# Patient Record
Sex: Male | Born: 1935 | Race: White | Hispanic: No | Marital: Married | Smoking: Former smoker
Health system: Southern US, Community
[De-identification: ages and names within clinical notes are randomized; demographics above are authoritative.]

## PROBLEM LIST (undated history)

## (undated) DIAGNOSIS — E119 Type 2 diabetes mellitus without complications: Secondary | ICD-10-CM

## (undated) DIAGNOSIS — I639 Cerebral infarction, unspecified: Secondary | ICD-10-CM

## (undated) DIAGNOSIS — H5789 Other specified disorders of eye and adnexa: Secondary | ICD-10-CM

## (undated) DIAGNOSIS — J189 Pneumonia, unspecified organism: Secondary | ICD-10-CM

## (undated) DIAGNOSIS — T4145XA Adverse effect of unspecified anesthetic, initial encounter: Secondary | ICD-10-CM

## (undated) DIAGNOSIS — I4892 Unspecified atrial flutter: Secondary | ICD-10-CM

## (undated) DIAGNOSIS — I739 Peripheral vascular disease, unspecified: Secondary | ICD-10-CM

## (undated) DIAGNOSIS — E785 Hyperlipidemia, unspecified: Secondary | ICD-10-CM

## (undated) DIAGNOSIS — M0820 Juvenile rheumatoid arthritis with systemic onset, unspecified site: Secondary | ICD-10-CM

## (undated) DIAGNOSIS — G4730 Sleep apnea, unspecified: Secondary | ICD-10-CM

## (undated) DIAGNOSIS — Z86718 Personal history of other venous thrombosis and embolism: Secondary | ICD-10-CM

## (undated) DIAGNOSIS — T8859XA Other complications of anesthesia, initial encounter: Secondary | ICD-10-CM

## (undated) DIAGNOSIS — I2510 Atherosclerotic heart disease of native coronary artery without angina pectoris: Secondary | ICD-10-CM

## (undated) DIAGNOSIS — I7410 Embolism and thrombosis of unspecified parts of aorta: Secondary | ICD-10-CM

## (undated) DIAGNOSIS — F329 Major depressive disorder, single episode, unspecified: Secondary | ICD-10-CM

## (undated) DIAGNOSIS — I350 Nonrheumatic aortic (valve) stenosis: Secondary | ICD-10-CM

## (undated) DIAGNOSIS — Z87442 Personal history of urinary calculi: Secondary | ICD-10-CM

## (undated) DIAGNOSIS — I10 Essential (primary) hypertension: Secondary | ICD-10-CM

## (undated) DIAGNOSIS — Z951 Presence of aortocoronary bypass graft: Secondary | ICD-10-CM

## (undated) DIAGNOSIS — F32A Depression, unspecified: Secondary | ICD-10-CM

## (undated) DIAGNOSIS — R578 Other shock: Secondary | ICD-10-CM

## (undated) DIAGNOSIS — Z8673 Personal history of transient ischemic attack (TIA), and cerebral infarction without residual deficits: Secondary | ICD-10-CM

## (undated) DIAGNOSIS — M1990 Unspecified osteoarthritis, unspecified site: Secondary | ICD-10-CM

## (undated) DIAGNOSIS — K219 Gastro-esophageal reflux disease without esophagitis: Secondary | ICD-10-CM

## (undated) DIAGNOSIS — Z8739 Personal history of other diseases of the musculoskeletal system and connective tissue: Secondary | ICD-10-CM

## (undated) HISTORY — PX: BUNIONECTOMY: SHX129

## (undated) HISTORY — PX: HAMMER TOE SURGERY: SHX385

## (undated) HISTORY — PX: COLON RESECTION: SHX5231

## (undated) HISTORY — PX: LITHOTRIPSY: SUR834

## (undated) HISTORY — PX: JOINT REPLACEMENT: SHX530

## (undated) HISTORY — PX: KNEE ARTHROSCOPY: SHX127

## (undated) HISTORY — PX: TONSILLECTOMY: SUR1361

---

## 1963-10-04 HISTORY — PX: OTHER SURGICAL HISTORY: SHX169

## 1995-10-04 DIAGNOSIS — M0820 Juvenile rheumatoid arthritis with systemic onset, unspecified site: Secondary | ICD-10-CM

## 1995-10-04 HISTORY — DX: Juvenile rheumatoid arthritis with systemic onset, unspecified site: M08.20

## 1998-10-03 DIAGNOSIS — I2510 Atherosclerotic heart disease of native coronary artery without angina pectoris: Secondary | ICD-10-CM

## 1998-10-03 DIAGNOSIS — E119 Type 2 diabetes mellitus without complications: Secondary | ICD-10-CM

## 1998-10-03 HISTORY — DX: Atherosclerotic heart disease of native coronary artery without angina pectoris: I25.10

## 1998-10-03 HISTORY — DX: Type 2 diabetes mellitus without complications: E11.9

## 1998-10-03 HISTORY — PX: CORONARY ARTERY BYPASS GRAFT: SHX141

## 1998-10-03 HISTORY — PX: HIATAL HERNIA REPAIR: SHX195

## 1999-02-10 ENCOUNTER — Encounter: Payer: Self-pay | Admitting: Cardiovascular Disease

## 1999-02-10 ENCOUNTER — Inpatient Hospital Stay (HOSPITAL_COMMUNITY): Admission: AD | Admit: 1999-02-10 | Discharge: 1999-02-16 | Payer: Self-pay | Admitting: Cardiovascular Disease

## 1999-02-11 ENCOUNTER — Encounter: Payer: Self-pay | Admitting: Cardiothoracic Surgery

## 1999-02-12 ENCOUNTER — Encounter: Payer: Self-pay | Admitting: Cardiothoracic Surgery

## 1999-02-13 ENCOUNTER — Encounter: Payer: Self-pay | Admitting: Cardiothoracic Surgery

## 1999-08-06 ENCOUNTER — Inpatient Hospital Stay (HOSPITAL_COMMUNITY): Admission: RE | Admit: 1999-08-06 | Discharge: 1999-08-11 | Payer: Self-pay

## 1999-08-19 DIAGNOSIS — I2510 Atherosclerotic heart disease of native coronary artery without angina pectoris: Secondary | ICD-10-CM | POA: Diagnosis present

## 2001-09-17 ENCOUNTER — Encounter (INDEPENDENT_AMBULATORY_CARE_PROVIDER_SITE_OTHER): Payer: Self-pay | Admitting: Specialist

## 2001-09-17 ENCOUNTER — Ambulatory Visit (HOSPITAL_COMMUNITY): Admission: RE | Admit: 2001-09-17 | Discharge: 2001-09-17 | Payer: Self-pay | Admitting: Gastroenterology

## 2002-07-01 ENCOUNTER — Encounter: Payer: Self-pay | Admitting: Orthopedic Surgery

## 2002-07-03 ENCOUNTER — Inpatient Hospital Stay (HOSPITAL_COMMUNITY): Admission: RE | Admit: 2002-07-03 | Discharge: 2002-07-07 | Payer: Self-pay | Admitting: Orthopedic Surgery

## 2007-10-04 DIAGNOSIS — I350 Nonrheumatic aortic (valve) stenosis: Secondary | ICD-10-CM

## 2007-10-04 HISTORY — PX: AORTIC VALVE REPLACEMENT: SHX41

## 2007-10-04 HISTORY — DX: Nonrheumatic aortic (valve) stenosis: I35.0

## 2008-04-10 DIAGNOSIS — Z953 Presence of xenogenic heart valve: Secondary | ICD-10-CM

## 2008-04-28 ENCOUNTER — Ambulatory Visit: Payer: Self-pay | Admitting: Cardiology

## 2008-06-18 ENCOUNTER — Ambulatory Visit: Payer: Self-pay | Admitting: Cardiology

## 2008-07-16 ENCOUNTER — Ambulatory Visit: Payer: Self-pay | Admitting: Cardiothoracic Surgery

## 2008-09-08 ENCOUNTER — Encounter: Payer: Self-pay | Admitting: Cardiothoracic Surgery

## 2008-09-08 ENCOUNTER — Ambulatory Visit (HOSPITAL_COMMUNITY): Admission: RE | Admit: 2008-09-08 | Discharge: 2008-09-08 | Payer: Self-pay | Admitting: Cardiothoracic Surgery

## 2008-09-09 ENCOUNTER — Ambulatory Visit: Payer: Self-pay | Admitting: Cardiothoracic Surgery

## 2008-09-10 ENCOUNTER — Encounter: Payer: Self-pay | Admitting: Cardiothoracic Surgery

## 2008-09-10 ENCOUNTER — Inpatient Hospital Stay (HOSPITAL_COMMUNITY): Admission: RE | Admit: 2008-09-10 | Discharge: 2008-09-16 | Payer: Self-pay | Admitting: Cardiothoracic Surgery

## 2008-09-10 ENCOUNTER — Ambulatory Visit: Payer: Self-pay | Admitting: Cardiothoracic Surgery

## 2008-10-10 ENCOUNTER — Ambulatory Visit: Payer: Self-pay | Admitting: Cardiothoracic Surgery

## 2008-10-10 ENCOUNTER — Encounter: Admission: RE | Admit: 2008-10-10 | Discharge: 2008-10-10 | Payer: Self-pay | Admitting: Cardiothoracic Surgery

## 2009-02-25 ENCOUNTER — Encounter: Admission: RE | Admit: 2009-02-25 | Discharge: 2009-02-25 | Payer: Self-pay | Admitting: Orthopedic Surgery

## 2009-10-03 HISTORY — PX: BACK SURGERY: SHX140

## 2010-06-08 ENCOUNTER — Ambulatory Visit (HOSPITAL_COMMUNITY): Admission: RE | Admit: 2010-06-08 | Discharge: 2010-06-08 | Payer: Self-pay | Admitting: Neurosurgery

## 2010-09-10 ENCOUNTER — Inpatient Hospital Stay (HOSPITAL_COMMUNITY)
Admission: RE | Admit: 2010-09-10 | Discharge: 2010-09-14 | Payer: Self-pay | Source: Home / Self Care | Attending: Neurosurgery | Admitting: Neurosurgery

## 2010-10-24 ENCOUNTER — Encounter: Payer: Self-pay | Admitting: Nephrology

## 2010-12-13 LAB — URINALYSIS, ROUTINE W REFLEX MICROSCOPIC
Ketones, ur: NEGATIVE mg/dL
Nitrite: NEGATIVE
Protein, ur: NEGATIVE mg/dL

## 2010-12-13 LAB — CBC
MCH: 33.1 pg (ref 26.0–34.0)
MCHC: 34.8 g/dL (ref 30.0–36.0)
MCV: 95.1 fL (ref 78.0–100.0)
Platelets: 240 10*3/uL (ref 150–400)
RBC: 4.74 MIL/uL (ref 4.22–5.81)

## 2010-12-13 LAB — COMPREHENSIVE METABOLIC PANEL
Albumin: 4.2 g/dL (ref 3.5–5.2)
BUN: 8 mg/dL (ref 6–23)
Calcium: 9.5 mg/dL (ref 8.4–10.5)
Chloride: 100 mEq/L (ref 96–112)
Creatinine, Ser: 0.91 mg/dL (ref 0.4–1.5)
GFR calc non Af Amer: >60 mL/min (ref >60)
Total Bilirubin: 0.8 mg/dL (ref 0.3–1.2)

## 2010-12-13 LAB — PROTIME-INR: Prothrombin Time: 12.7 seconds (ref 11.6–15.2)

## 2010-12-13 LAB — GLUCOSE, CAPILLARY
Glucose-Capillary: 119 mg/dL — ABNORMAL HIGH (ref 70–99)
Glucose-Capillary: 127 mg/dL — ABNORMAL HIGH (ref 70–99)

## 2010-12-13 LAB — DIFFERENTIAL
Basophils Absolute: 0 10*3/uL (ref 0.0–0.1)
Lymphocytes Relative: 25 % (ref 12–46)
Lymphs Abs: 2 10*3/uL (ref 0.7–4.0)
Monocytes Absolute: 0.8 10*3/uL (ref 0.1–1.0)
Neutro Abs: 4.7 10*3/uL (ref 1.7–7.7)

## 2010-12-13 LAB — APTT: aPTT: 24 seconds (ref 24–37)

## 2010-12-16 LAB — URINALYSIS, ROUTINE W REFLEX MICROSCOPIC
Bilirubin Urine: NEGATIVE
Hgb urine dipstick: NEGATIVE
Ketones, ur: 15 mg/dL — AB
Protein, ur: NEGATIVE mg/dL
Urobilinogen, UA: 0.2 mg/dL (ref 0.0–1.0)

## 2010-12-16 LAB — CBC
Platelets: 244 10*3/uL (ref 150–400)
RBC: 3.86 MIL/uL — ABNORMAL LOW (ref 4.22–5.81)
WBC: 8.1 10*3/uL (ref 4.0–10.5)

## 2010-12-16 LAB — COMPREHENSIVE METABOLIC PANEL
AST: 27 U/L (ref 0–37)
Albumin: 4.1 g/dL (ref 3.5–5.2)
Chloride: 104 mEq/L (ref 96–112)
Creatinine, Ser: 3.28 mg/dL — ABNORMAL HIGH (ref 0.4–1.5)
GFR calc Af Amer: 23 mL/min — ABNORMAL LOW (ref >60)
Potassium: 4.7 mEq/L (ref 3.5–5.1)
Sodium: 138 mEq/L (ref 135–145)
Total Bilirubin: 0.7 mg/dL (ref 0.3–1.2)

## 2010-12-16 LAB — APTT: aPTT: 24 seconds (ref 24–37)

## 2010-12-16 LAB — DIFFERENTIAL
Basophils Absolute: 0 10*3/uL (ref 0.0–0.1)
Eosinophils Relative: 4 % (ref 0–5)
Lymphocytes Relative: 29 % (ref 12–46)
Monocytes Absolute: 0.8 10*3/uL (ref 0.1–1.0)

## 2010-12-16 LAB — TYPE AND SCREEN
ABO/RH(D): A POS
Antibody Screen: NEGATIVE

## 2010-12-16 LAB — URINE MICROSCOPIC-ADD ON

## 2010-12-16 LAB — SURGICAL PCR SCREEN: Staphylococcus aureus: NEGATIVE

## 2011-02-15 NOTE — Assessment & Plan Note (Signed)
OFFICE VISIT   FELIBERTO, STOCKLEY  DOB:  20-Oct-1934                                        October 10, 2008  CHART #:  [id number]   CURRENT PROBLEMS:  1. Status post redo sternotomy for aortic valve replacement with a 23-      mm pericardial tissue valve for severe aortic sclerosis with      congestive heart failure.  2. Status post multivessel coronary artery bypass surgery in 2000 with      patent grafts.  3. Diabetes mellitus.  4. Obesity.  5. Sleep apnea.  6. Rheumatoid arthritis.   PRESENT ILLNESS:  The patient is a pleasant 76 year old diabetic  nonsmoker who recently underwent redo sternotomy aortic valve  replacement for a moderate-to-severe aortic stenosis with a documented  episode of class IV CHF.  He did well after surgery remaining in sinus  rhythm and his ventricular function is fairly well preserved.  He  continues to progress at home without recurrent symptoms of CHF or  angina and the sternal chest incision is healing well.  He is now on  room air oxygen and he is scheduled to start outpatient cardiac  rehabilitation later this month.  He has been on multiple medications  and Dr. Alanda Amass recently increased his beta-blocker to Lopressor 25 mg  b.i.d. and reduced the Altace 10 mg once a day.  He has been receiving  home physical therapy and has progressed and advanced past that stage  now.  He was not placed on Coumadin because he is on chronic Plavix  therapy and aspirin.   His other medications include Aricept, TriCor, metformin, Nexium, Zocor,  Lexapro, diclofenac for his arthritis, and vitamins.   PHYSICAL EXAMINATION:  VITAL SIGNS:  Blood pressure 150/80, pulse 80 and  regular, respirations 18, and saturation 97%.  GENERAL:  He is alert and oriented.  LUNGS:  Breath sounds are clear and equal.  The sternum is stable and  well healed.  CARDIAC:  Pericardial rhythm is regular with a few PACs.  There is no  murmur or gallop.  EXTREMITIES:  He has trace ankle edema.  NEUROLOGIC:  Intact.   A PA and lateral chest x-ray today shows some mild bilateral pleural  effusions with stable cardiac silhouette and the sternal wires were all  intact and well aligned.   IMPRESSION AND PLAN:  I told the patient he can resume driving light  activities, but not to lift more than 20 pounds until March.  It is fine  for him to start with his rehabilitation up at the [facility].  I  gave him a short course of oral Lasix of 40 mg a day for 1 week and  otherwise, we would be happy to see him back for any future surgical  issues.   [name], M.D.  Electronically Signed   PV/MEDQ  D:  10/10/2008  T:  10/10/2008  Job:  [id number]   cc:   Gerlene Burdock A. Alanda Amass, M.D.  Doreen Beam, MD

## 2011-02-15 NOTE — H&P (Signed)
HISTORY AND PHYSICAL EXAMINATION   September 09, 2008   Re:  [name] [name], [name] [name]       DOB:  11/19/1934   ADMISSION DIAGNOSES:  1. Moderate-to-severe aortic stenosis with class IV congestive heart      failure.  2. Status post multivessel bypass grafting in 2000 with patent grafts.  3. Diabetes mellitus.  4. Obesity.  5. Sleep apnea on BiPAP.  6. Juvenile rheumatoid arthritis.   CHIEF COMPLAINT:  Shortness of breath on exertion and a cardiac murmur.   PRESENT ILLNESS:  The patient is a 76 year old obese diabetic nonsmoker  who has been found by serial 2D echos to have significant aortic  stenosis with a calculated area of 0.7.  His EF is 50-60% and he has  patent bypass grafts to the LAD, ramus OM, and posterior descending from  bypass surgery 9-1/2 years ago.  A right heart cath showed mildly  elevated right-sided pressures of cardiac output of 5 liters and  calcified valve leaflets by fluoroscopy.  He was felt by Dr. Alanda Amass,  his cardiologist to the candidate for aortic valve replacement, to  preserve LV function, and to prevent further episodes of CHF, which  required hospitalization.  He has had his preop studies including his  carotid Dopplers, which showed no significant disease.  His chest x-ray  shows no overt CHF with prior sternotomy.  His dentist has examined his  teeth and he has been cleared for elective cardiac valve surgery.  He  has stopped his Plavix and fish oil, which he has been taking long term  for a TIA in the past.  He has had no respiratory symptoms of influenza  or other problems and is now ready to be admitted for elective aortic  valve replacement with a bioprosthetic valve.   HOME MEDICATIONS:  Lopressor 50 mg q.a.m. and 25 mg q.p.m.; Plavix;  Tricor; Altace 10 mg b.i.d.; Nexium; aspirin 81 mg; Niaspan 1 g;  vitamins E, C and D; Zocor 20 mg; Lexapro 10 mg; Aricept 10 mg;  metformin 1 g b.i.d.; Mobic p.r.n. pain for arthritis;  Lasix 40 mg; and  potassium 20 mEq daily.   ALLERGIES:  None.   SURGICAL HISTORY:  1. Status post CABG x4 in May 2000.  2. 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  3. Status post hemicolectomy in 1989 for resection of a benign tumor.   SOCIAL HISTORY:  The patient is married and has a sedentary lifestyle.  He does not smoke or use alcohol.  He is very active in the area of [employer]  [employer].   REVIEW OF SYSTEMS:  Vital Signs:  His weight has been stable, 290  pounds.  He has had an influenza vaccine.  ENT review is negative.  Thoracic review is negative for recent trauma and his sternal incisions  have healed well.  He has had an umbilical hernia repair approximately 3  years ago without incident.  He has had no symptoms of dysuria or  hematuria, blood per rectum, nausea, vomiting, and is diabetic.  Sugars  have been under fairly good control.   PHYSICAL EXAMINATION:  VITAL SIGNS:  The patient is 5 feet 11 inches,  weighs 290 pounds, blood pressure is 160/80, pulse 70, respirations 20,  and saturation room air is 97%.  GENERAL APPEARANCE:  A middle-aged obese white male, in no distress.  HEENT:  Normocephalic.  Dentition good.  Pupils equal.  NECK:  Without JVD or mass and there is a soft right carotid bruit.  LYMPHATICS:  No palpable cervical or supraclavicular palpable  adenopathy.  CHEST:  Clear breath sounds.  He has a well-healed sternal incision.  CARDIAC:  Regular rhythm and he has a grade 3-4/6 systolic ejection  murmur heard best at the right upper sternal border.  There is no  diastolic murmur.  ABDOMEN:  Soft, nontender with well-healed old surgical scars.  EXTREMITIES:  Mild pedal edema.  Peripheral pulses are intact.  NEUROLOGIC:  Intact and he has a fairly normal gait, but with a slightly  shuffled walking pattern.   LABORATORY DATA:  His coronary arteriograms revealed patent grafts with  occluded native vessels.  He has  a good systolic LV function.  He has  moderate-to-severe aortic stenosis with calcified leaflets in the valve  area of 0.7 by echo and a transvalvular gradient of 24 mmHg.   PLAN:  We had planned the patient will be headed for aortic valve  replacement with a bioprosthetic valve on September 10, 2008.  I discussed  the indications, benefits, and risks of surgery with the patient, which  he understands and agrees to proceed.   [name], M.D.  Electronically Signed   PV/MEDQ  D:  09/09/2008  T:  09/09/2008  Job:  [id number]

## 2011-02-15 NOTE — Op Note (Signed)
NAME:  [name] [name], [name] [name] NO.:  [id number]   MEDICAL RECORD NO.:  [id number]          PATIENT TYPE:  INP   LOCATION:  2305                         FACILITY:  [facility]   PHYSICIAN:  [name], M.D.  DATE OF BIRTH:  [birth year]   DATE OF PROCEDURE:  09/10/2008  DATE OF DISCHARGE:                               OPERATIVE REPORT   OPERATIONS:  1. Redo median sternotomy.  2. Aortic valve replacement with a 23-mm pericardial valve [facility], model 3300TFX, serial [phone]).   PREOPERATIVE DIAGNOSIS:  Moderate-to-severe aortic stenosis with class  IV congestive heart failure.   POSTOPERATIVE DIAGNOSIS:  Moderate-to-severe aortic stenosis with class  IV congestive heart failure.   SURGEON:  [name], MD   ASSISTANT:  [name], PA-C   ANESTHESIA:  General by Dr. [name].   INDICATIONS:  The patient is a 76 year old obese male status post CABG  x4 in early year 2000.  He was recently hospitalized with acute  congestive heart failure and found to have patent grafts with moderate-  to-severe aortic stenosis.  The aortic valve area by 2-D echo was 0.7.  The aortic gradient and the area calculated by cath were slightly less  severe.  Because of his markedly abnormal aortic valve and episode of  congestive failure, it was felt that he would benefit from aortic valve  replacement with a tissue valve.  I examined the patient in the office  and reviewed results of his echo study and cath.  I discussed with him  the indications and expected benefits of aortic valve replacement for  treatment of his moderate-to-severe aortic stenosis.  I reviewed the  alternatives as well.  I discussed with the patient the major aspects of  the planned procedure including the location of the surgical incision,  the use of general anesthesia in cardiopulmonary bypass, the plan to use  a tissue valve due to his age over 70 years, and the expected  postoperative hospital recovery.  I also discussed with [name]  the risks to him of bleeding, infection, stroke, heart attack, blood  transfusion requirement, and death.  After reviewing these issues, he  demonstrated his understanding and agreed to proceed with the surgery  under what I felt was an informed consent.   OPERATIVE FINDINGS:  1. Moderate-to-severe aortic stenosis with severe calcification and      fibrosis of the valve, effectively treated with a 23-mm pericardial      valve replacement.  2. Patent bypass grafts which were identified and protected during the      operation.  3. No blood cell transfusions were required for the operation.   PROCEDURE IN DETAIL:  The patient was brought to operating room and  placed supine on the operating table where general anesthesia was  induced under invasive hemodynamic monitoring.  The chest, abdomen, and  legs were prepped with Betadine and draped as a sterile field.  A  sternal incision was made and the sternum was divided using the  oscillating saw with care being taken to avoid injury to the underlying  vascular structures.  The anterior mediastinum was dissected of its  investing dense adhesions.  The ascending aorta and right atrium were  identified.  A left-sided Rultract retractor was then placed to perform  the dissection on the left side of heart, at which time the mammary  artery pedicle was identified coming through the pericardium and was  encircled with a vessel loop.  The main sternal retractor was then  placed using the deep blades due to his obese body habitus.  Further  dissection was then completed and the patient was heparinized.  Purse-  strings were placed in the ascending aorta, in the arch, and in the  right atrium.  After the ACT was documented as being therapeutic, the  patient was cannulated and placed on bypass.  A left ventricular vent  was placed via the right superior pulmonary vein.   Cardioplegia  catheters were placed for cold blood cardioplegia in the ascending aorta  and into the coronary sinus.  The patient was cooled to 32 degrees and  using a large cross-clamp, the ascending aorta was clamped just below  the aortic cannula and above the previously identified vein grafts.  The  vein graft to the right coronary was mobilized and gently retracted  laterally, so as to allow room for the aortotomy and the aortic valve  replacement.  Cold blood cardioplegia 1 L was delivered to the aortic  root as well as to the coronary sinus catheters and there was good  cardioplegic arrest with septal temperature dropping less than 15  degrees.  Cardioplegia was then delivered every 20 minutes or less while  the cross-clamp was applied.   The mammary artery pedicle was clamped with an atraumatic vascular  bulldog while the cross-clamp was in place.  The other vein grafts were  identified and mobilized and also occluded with atraumatic vascular  bulldogs.   A transverse aortotomy was made just above the sinotubular junction.  Aortic valve was inspected and was calcified stenotic and fibrotic.  It  was excised and the annulus was debrided of calcium.  The outflow tract  was irrigated with cold saline.  The annulus sized to 23-mm Magna-Ease  valve.  Seventeen subannular 2-0 pledgeted Ethibond sutures were then  placed around the annulus.  The valve was washed and prepared according  to the protocol.  The sutures were placed to the sewing ring, the valve  was seated, and the sutures were tied.  The valve had good orientation  without obstruction of the coronary ostia and there were no spaces in  between the sutures which would cause the perivalvular leak.  The valve  was then again rinsed with cold saline and the aortotomy was closed in 2  layers using running 4-0 Prolene.  Prior to removing the cross-clamp,  air was vented from the coronaries and left side of heart with a dose of   retrograde warm blood cardioplegia in the usual de-airing maneuvers on  bypass.  The cross-clamp was then removed.   The heart was cardioverted back to a regular rhythm.  The aortotomy was  inspected and found to be hemostatic.  Air was aspirated from the vein  grafts and each was opened.  The mammary artery was unclamped to  reestablish flow.  The patient was rewarmed to 37 degrees.  Temporary  pacing wires were applied.  The lungs re-expanded.  The ventilator was  resumed.  The patient was weaned from bypass without difficulty on low-  dose dopamine and  milrinone.  Cardiac output and blood pressure were  stable.  Protamine was administered without adverse reaction.  The  patient still had diffuse coagulopathy and bleeding.  He was given  platelets and FFP with improved coagulation function.  The mediastinum  was irrigated with warm antibiotic irrigation.  The superior pericardial  fat was closed over the aorta.  Two mediastinal chest tubes were placed  and brought out through separate incisions.  The sternum was closed with  interrupted steel wire.  The pectoralis fascia was closed in a running  #1 Vicryl.  The subcutaneous and skin layers were closed in running  Vicryl and sterile dressings were applied.  Total bypass time was 153  minutes.      [name], M.D.  Electronically Signed    PV/MEDQ  D:  09/10/2008  T:  09/11/2008  Job:  [id number]   cc:   Gerlene Burdock A. Alanda Amass, M.D.  [facility]

## 2011-02-15 NOTE — Discharge Summary (Signed)
NAME:  [name] [name], [name] [name] NO.:  [id number]   MEDICAL RECORD NO.:  [id number]          PATIENT TYPE:  INP   LOCATION:  2018                         FACILITY:  [facility]   PHYSICIAN:  [name], M.D.  DATE OF BIRTH:  05/25/1935   DATE OF ADMISSION:  09/10/2008  DATE OF DISCHARGE:  09/16/2008                               DISCHARGE SUMMARY   HISTORY:  The patient is a 76 year old, obese, diabetic, nonsmoker, who  has been found by serial 2-D echocardiograms to have significant aortic  stenosis with a calculated valve area of 0.7.  His ejection fraction was  noted to be 50-60%, and he had patent coronary bypass grafts to the LAD,  ramus, and posterior descending from surgical revascularization  approximately 9 years ago.  He recently underwent a right heart  catheterization showing mildly elevated right-sided pressures with  cardiac output of 5 L and significantly calcified valve leaflets by  fluoroscopy.  He was felt by Dr. Alanda Amass to be a candidate for aortic  valve replacement.  He was referred to [name], MD, who  evaluated the patient and studies and agreed with recommendations, and  he was admitted this hospitalization for redo sternotomy and/or aortic  valve replacement.  Preop studies included carotid Dopplers which showed  no significant disease.  A chest x-ray showed no overt congestive heart  failure with prior sternotomy.  He received dental clearance.  He was on  both, Plavix and fish oil which were stopped prior to admission and were  being used for previous TIA.  He was admitted this hospitalization for  elective aortic valve replacement with bioprosthetic valve.   Past medical history includes the following:  1. Moderate-to-severe aortic stenosis with history of class IV      congestive heart failure.  2. Coronary artery disease status post previous multivessel bypass      grafting in 2000 with patent grafts.  3. Diabetes mellitus.  4.  Obesity.  5. Sleep apnea on chronic BiPAP.  6. History of juvenile rheumatoid arthritis.   Medications prior to admission included the following:  1. Aricept 10 mg daily.  2. Lopressor 25 mg twice daily.  3. Plavix 75 mg daily, last taken on August 29, 2008.  4. TriCor 160 mg daily.  5. Metformin 500 mg daily.  6. Nexium 40 mg b.i.d.  7. Zocor 20 mg daily.  8. Aspirin 81 mg daily.  9. Niaspan 1000 mg at bedtime.  10.Temazepam 30 mg at bedtime.  11.Altace 10 mg twice daily.  12.Lexapro 10 mg daily.  13.Diclofenac 75 mg daily.  14.Lasix 40 mg daily.  15.Potassium chloride 10 mEq daily.  16.Multivitamin 1 daily.  17.B complex vitamin 1 daily.  18.Vitamin E 1000 units daily.  19.Vitamin C 500 mg daily.  20.Omega-3 1000 mg daily.  21.Tylenol Arthritis formula 2 tablets every 8 hours.   ALLERGIES:  None.   PAST SURGICAL HISTORY:  1. Coronary artery bypass grafting in May 2000.  2. Status post thoracotomy in the 1960s for a mycotic lung infection      on the left.  3. Status  post hemicolectomy in 1989 for resection of a benign tumor.   FAMILY HISTORY, SOCIAL HISTORY, REVIEW OF SYMPTOMS, AND PHYSICAL  EXAMINATION:  Please see the history and physical done at the time of  admission.   HOSPITAL COURSE:  The patient was admitted electively on September 10, 2008.  He was taken to the operating room where he underwent the  following procedures:  1. Redo median sternotomy.  2. Aortic valve replacement with a 23-mm pericardial valve [facility], model 3300TFX, serial number [id number]).  This procedure      was performed by [name], MD.  He tolerated the procedure      well and was taken to the surgical intensive care unit in stable      condition.   POSTOPERATIVE HOSPITAL COURSE:  Overall, the patient has made very  steady progress.  He has remained hemodynamically stable.  Initially, he  did require some inotropic support including milrinone and dopamine,  but  these were weaned without difficulty.  He was weaned from the ventilator  without difficulty.  He has remained neurologically intact.  Initially,  he was quite weak but has progressed nicely in regards to his cardiac  rehabilitation.  He has had moderate volume overload requiring diuresis  which will continue as an outpatient.  He has had no significant cardiac  dysrhythmias.  His diabetes is under adequate control with aggressive  management.  His metformin has been restarted.  His laboratory values  revealed acute blood loss anemia, but his values stabilized.  Most  recent hematocrit dated September 15, 2008, is 37.  Creatinine for same  date is 1.1.  He has required aggressive pulmonary toilet but has  responded well and oxygen has been weaned, and he currently maintains  good saturations on room air.  His incision is healing well without  evidence of infection.  He has been placed back on Plavix and aspirin  and is not felt to require Coumadin.  His overall status is felt to be  tentatively stable for discharge in the morning of September 16, 2008,  pending morning round reevaluation.   Medications at discharge were as follows:  1. Aricept 10 mg daily.  2. Plavix 75 mg daily.  3. TriCor 160 mg daily.  4. Metformin 1000 mg twice daily.  5. Nexium 40 mg b.i.d.  6. Zocor 20 mg nightly.  7. Temazepam 30 mg at bedtime.  8. Lexapro 10 mg daily.  9. Lasix 40 mg for next 7 days.  10.Potassium chloride 10 mEq daily for 7 days.  11.Multivitamin 1 daily.  12.B complex vitamin 1 daily.  13.Vitamin E 1000 units daily.  14.Vitamin C 500 mg daily.  15.Omega-3 supplements 1000 mg daily.  16.Tylenol Arthritis formula p.r.n.  17.Aspirin 325 mg daily.  18.Toprol-XL 25 mg daily.  19.Tylox 1-2 every 4 hours as needed for pain.   INSTRUCTIONS:  The patient received written instructions regarding  medications, activity, diet, wound care, and followup.  Followup include  Dr. Donata Clay in 3  weeks, Dr. Alanda Amass in 2 weeks.   CONDITION ON DISCHARGE:  Stable and improving.   FINAL DIAGNOSIS:  Moderate to severe aortic stenosis with class IV  congestive heart failure, now status post aortic valve replacement and  redo median sternotomy.   OTHER DIAGNOSES:  1. Coronary artery disease status post multivessel coronary artery      bypass grafting x4 in 2000.  2. History of diabetes mellitus.  3. Obesity.  4. Chronic sleep apnea, on BiPAP.  5. Juvenile rheumatoid arthritis.  6. Postoperative acute blood loss anemia.      Rowe Clack, P.A.-C.      [name], M.D.  Electronically Signed    WEG/MEDQ  D:  09/15/2008  T:  09/15/2008  Job:  [id number]   cc:   Gerlene Burdock A. Alanda Amass, M.D.

## 2011-02-18 NOTE — Op Note (Signed)
NAME:  [name] [name], [name] [name]                       ACCOUNT NO.:  [id number]   MEDICAL RECORD NO.:  [id number]                   PATIENT TYPE:  INP   LOCATION:  5028                                 FACILITY:  [facility]   PHYSICIAN:  Elana Alm. Thurston Hole, M.D.              DATE OF BIRTH:  [birth year]   DATE OF PROCEDURE:  07/03/2002  DATE OF DISCHARGE:                                 OPERATIVE REPORT   PREOPERATIVE DIAGNOSIS:  Left knee degenerative joint disease.   POSTOPERATIVE DIAGNOSIS:  Left knee degenerative joint disease.   PROCEDURE:  Left total knee replacement using [employer]ics Scorpio total knee  system with #9 cemented femoral component, #11 cemented tibial component  with 12 mm polyethylene tibial flex spacer, a 30 mm polyethylene cemented  patella.   SURGEON:  Elana Alm. Thurston Hole, M.D.   ASSISTANT:  Julien Girt, P.A.   ANESTHESIA:  General.   OPERATIVE TIME:  1-1/2 hours.   COMPLICATIONS:  None.   DESCRIPTION OF PROCEDURE:  The patient is brought to the operating room on  07/03/02, placed on the operating room table in supine position.  After an  adequate level of general anesthesia was obtained, his left knee was  examined under anesthesia.  Range of motion from -10 to 120 degrees with  moderate varus deformity, stable ligamentous examination with normal  patellar tracking.  He had a Foley catheter placed under sterile conditions,  and received Ancef 1 g IV preoperatively for prophylaxis.  His left leg was  then prepped using sterile Duraprep and draped using sterile technique.  Leg  was exsanguinated and a thigh tourniquet elevated 375 mm.  Initially,  through a 20 cm longitudinal incision based over the patella, initial  exposure was made.  Then all the subcutaneous tissues were incised along  with the skin incision.  A median arthrotomy was performed revealing  excessive amount of normal appearing joint fluid.  The articular surfaces  were inspected.  He was found to  have grade 4 changes medially, grade 3  changes laterally, grade 3 and 4 changes in the patellofemoral joint.  The  medial and lateral meniscal remnants were removed, as well as the anterior  cruciate ligament.  Osteophytes were removed off the femoral condyles and  tibial plateau.  An intramedullary drill was then drilled up the femoral  canal for placement of the distal femoral cutting jig which was placed in  the appropriate amount of rotation, and a distal 12 mm cut was made.  The  distal femur was then sized.  A #9 was found to be the appropriate size, and  the #9 cutting jig was placed, and then these cuts were made.  The proximal  tibia was then exposed.  The tibial spines were removed with an oscillating  saw.  Intramedullary drill drilled down the tibial canal for placement of  the proximal tibial cutting jig which was placed in the appropriate amount  of rotation, and a 4 mm proximal tibial cut was made.  After this was done,  the Scorpio PCL cutter was placed back on the distal femur, and these cuts  were made.  After this was done, the #9 femoral trial was placed, the #11  tibial base plate trial was placed, and a 12 mm polyethylene spacer was  placed giving excellent stability, correction of his varus deformity, and  range of motion from 0 to 130 degrees.  The tibial base plate was then  marked for rotation, and the Keel cut was made.  After this was done, the  patella was sized.  A 30 mm was found to be the appropriate size and a  recessed 10 mm x 30 mm cut was made and three locking holes were placed.  After this was done, it was felt that all the trial components were of  excellent size, fit, and stability.  They were then removed.  The knee was  then jet lavaged irrigated with 3 L of saline solution.  The proximal tibia  was then exposed, and the #11 tibial base plate with cement backing was then  hammered into position with an excellent fit, with excess cement being   removed from around the edges.  The #9 femoral component with cement backing  was hammered into position, also with an excellent fit with excess cement  being removed from around the edges.  The 12 mm polyethylene flexed tibial  spacer was locked on the tibial base plate, the knee taken through a range  of motion 0 to 125 degrees with excellent stability and no lift-off on the  tray.  The 30 mm polyethylene patella was then locked in this recessed hole  with cement backing and held there with a clamp.  After the cement hardened,  patellofemoral tracking was evaluated, and this was found to be normal with  no need for a lateral release.  After this was done, it was felt that all  the trial components were of excellent size, fit, and stability.  The knee  was further irrigation with antibiotic solution.  The median arthrotomy was  then closed with #1 Ethibond suture over two medium Hemovac drains.  The  subcutaneous tissues were closed with 0 and 2-0 Vicryl.  Skin was closed  with skin staples.  Sterile dressings were applied.  The Hemovac was  injected with 0.25% Marcaine with epinephrine and clamped.  Tourniquet was  released.  The patient then had a femoral nerve block placed by anesthesia  for postoperative pain control.  He was then awakened and taken to the  recovery room in stable condition.  Needle and sponge counts were correct x2  at the end of the case.                                                Robert A. Thurston Hole, M.D.    RAW/MEDQ  D:  07/03/2002  T:  07/06/2002  Job:  [id number]

## 2011-02-18 NOTE — Consult Note (Signed)
NEW PATIENT CONSULTATION   [name] [name], [name] [name]  DOB:  10-May-1935                                        July 16, 2008  CHART #:  [id number]   PRIMARY CARE PHYSICIAN:  Doreen Beam, MD in [place].   REASON FOR CONSULTATION:  Moderate-to-severe aortic stenosis with  episode of CHF requiring hospitalization.   CHIEF COMPLAINT:  Shortness of breath and a heart murmur.   HISTORY OF PRESENT ILLNESS:  I was asked to evaluate this 76 year old  obese white male diabetic for potential redo sternotomy and aortic valve  replacement for recently diagnosed moderate-to-severe aortic stenosis.  The patient had urgent CABG x4 in May 2000 for critical three-vessel  coronary disease and unstable angina.  At that time, the left IMA was  grafted, the LAD and vein grafts were placed to the ramus intermediate,  OM, and posterior descending.  He did well from a cardiac standpoint  until last month when he developed symptoms of CHF shortly after waking  up on Monday morning.  He had severe shortness of breath, orthopnea, and  bilateral ankle swelling.  He was admitted to [facility]  where his BNP was 540, and his chest x-ray showed interstitial pulmonary  edema, and he responded well to Lasix diuresis.  His cardiac enzymes  were negative.  He subsequently underwent an office 2-D echo by Dr.  Alanda Amass which indicated his aortic valve gradient was increased with a  mean of 34 mmHg and a calculated valve area of 0.72.  A stress  Cardiolite scan was performed that showed an EF of 50-60% without clear  ischemia.  He underwent diagnostic left and right heart cath last week  on October 8.  This demonstrated patent grafts to the LAD, ramus, OM,  and PDA.  His EF was 55%.  The transvalvular aortic gradient mean was 22  mmHg.  PA pressures were 40/10 and the cardiac output was 5.7 liters per  minute.  The estimated aortic valve area by cath data was 1.3.  There  was calcification of  the aortic leaflets seen on fluoroscopy.  The  catheter did pass the aortic valve to obtain a ventriculogram, which  showed no significant mitral regurgitation.  An aortogram was performed,  which showed mild AI.  Because of the patient's aortic valve disease and  his episode of CHF, he was felt to be a potential candidate for redo  sternotomy and/or valve replacement with a bioprosthetic valve.  Since  this episode and hospitalization for his CHF, the patient has been  asymptomatic and is taking Lasix.   MEDICATIONS:  Lopressor 50 mg q.a.m., 25 mg nightly; Plavix 75 mg daily;  TriCor 160 mg daily; Altace 10 mg b.i.d.; Nexium 40 mg daily; aspirin 81  mg daily; Niaspan 1 g daily; multivitamins 1 daily; vitamin E 1000 units  daily; vitamin C 500 mg daily; Zocor 20 mg nightly; fish oil 1 g daily;  Lexapro 10 mg daily; Aricept 10 mg daily; metformin 500 mg daily;  temazepam 30 mg nightly; and Mobic 75 mg daily as needed for arthritis;  Lasix 40 mg daily; and potassium 10 mEq daily.   ALLERGIES:  None.   PAST MEDICAL HISTORY:  1. 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  2. Hypertension.  3. Coronary artery disease  status post CABG x4 in May 2000 with patent      graft.  4. Diabetes mellitus.  5. Moderate-to-severe aortic stenosis.  6. Hyperlipidemia.  7. Sleep apnea on CPAP nightly, 11 cm of water pressure.  8. 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  9. Exogenous obesity.   SURGICAL HISTORY:  1. CABG x4 in May 2000.  2. 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  3. Status post hemicolectomy in 1989 for resection of benign tumor.   SOCIAL HISTORY:  The patient is retired from [employer].  He is married and  has a sedentary lifestyle due to his arthritis and his obesity, which  limits his mobility.  He has not fallen.  He quit smoking at age 64.  He  denies alcohol  intake.   FAMILY HISTORY:  Significant for coronary artery disease.  Negative for  cardiac valve disease.   REVIEW OF SYSTEMS:  General review is negative for fever or weight loss.  The patient has had a flu influenza vaccine while he was hospitalized in  September.  ENT review is significant that he sees a dentist twice a  year and has no active dental problems or difficulty swallowing.  Thoracic view is negative for history of trauma, but he has had a prior  thoracotomy for drainage of a mycotic lung infection.  He has also had  sternotomy for his CABG.  He has some broken sternal wires on his x-ray.  Cardiac review is positive for history of his cardiac murmur and known  moderate-to-severe aortic stenosis by echo and cath.  He has preserved  LV function with patent grafts.  GI review is negative for hepatitis,  jaundice, blood per rectum or pain.  Vascular review is positive for  _TIA (remote) and_________ negative for claudication.  He does have  bilateral venous insufficiency and some stasis skin changes and chronic  edema, right greater than left leg.  Negative history of DVT.  Endocrine  is positive for diabetes.  Negative for thyroid disease.  Hematologic  review is negative for bleeding disorder or history of bleeding with his  prior surgical operations.  Neurologic review is negative for a stroke  or seizure.   PHYSICAL EXAMINATION:  VITAL SIGNS:  The patient is 5 foot 11, weight is  290 pounds, blood pressure 160/70, pulse 60, respirations 20, and  saturation on room air 95%.  GENERAL APPEARANCE:  An elderly obese white male in no acute distress.  HEENT:  Normocephalic.  Dentition good.  NECK:  No JVD noted.  Carotids are with a soft right bruit negative left  carotid bruit.  LYMPHATICS:  Revealed no palpable cervical or supraclavicular  adenopathy.  LUNGS:  Breath sounds are clear without wheeze.  The thorax is without  deformity.  The sternal incision is well-healed.   CARDIAC:  Regular.  There is loud 3-4/6 ejection murmur at the right  upper sternal border.  There is no diastolic rumble.  ABDOMEN:  Obese, soft, nontender.  He has a well-healed surgical scar  from the colectomy.  EXTREMITIES:  Revealed mild pedal edema.  Peripheral pulses are intact  in the extremities, and he has 2+ edema on the right, 1+ edema on the  left and a well-healed surgical scar over his right lower leg where the  saphenous vein was harvested surgically.  NEUROLOGIC:  Intact.  He is  able  to walk with slightly shuffled gait.   LABORATORY DATA:  I reviewed his cardiac cath and the 2-D echo reports  from Dr. Kandis Cocking office.  He has moderate-to-severe aortic stenosis  with an episode of symptomatic CHF requiring hospitalization and  diuresis.  At that time, there is no evidence of atrial fibrillation,  coexisting infectious process, anemia, or significant stress.   RECOMMENDATIONS:  His aortic valve is definitely stenotic and the  severity of the stenosis varies between the assessment mode (cath versus  2-D echo).  Clearly had one episode of CHF without any other etiologic  issues other than his aortic valve, and I would agree that he would be a  candidate for aortic valve replacement with a bioprosthetic valve, but  in increased risk due to his comorbid factors.  He wishes to participate  in [place] [employer] ceremony in mid November, and so his surgery will be  scheduled for early December.  He will obtain a note from his dentist  clearing him for elective heart valve replacement, and I will see him  back in office on  December 4th for the surgery the following week.  He will stop the  Plavix and fish oil approximately 10 days prior to surgery.  He will  continue the aspirin and his other medications.   [name], M.D.  Electronically Signed   PV/MEDQ  D:  07/16/2008  T:  07/17/2008  Job:  [id number]   cc:   Doreen Beam, MD

## 2011-02-18 NOTE — Op Note (Signed)
[place]  Patient:    [name] [name], [name] [name] Visit Number: [id number] MRN: [id number]          Service Type: END Location: ENDO Attending Physician:  Rich Brave Dictated by:   Florencia Reasons, M.D. Proc. Date: 09/17/01 Admit Date:  09/17/2001   CC:         Doreen Beam, M.D.   Operative Report  PROCEDURE PERFORMED:  Colonoscopy with polypectomy.  ENDOSCOPIST:  Florencia Reasons, M.D.  INDICATIONS FOR PROCEDURE:  The patient is a 76 year old with a prior history of significant right-sided colonic polyp necessitating a right hemicolectomy approximately 12 years ago, now six years status post his most recent surveillance colonoscopy.  FINDINGS:  Several diminutive polyps removed.  DESCRIPTION OF PROCEDURE:  The nature, purpose and risks of the procedure were familiar to the patient from prior examination and he provided written consent.  Sedation was fentanyl 75 mcg and Versed 10 mg IV without arrhythmias or desaturation.  Digital exam of the prostate was unremarkable.  The Olympus adult video colonoscope was advanced to the ileocolonic anastomosis and pullback was then performed.  The quality of the prep was excellent and it is felt that all areas were well seen.  There were a couple of tiny polyps in the transverse colon removed by one or two cold biopsies each.  Also, at about 18 cm from the external anal opening, there was a 4 mm semipedunculated polyp removed by combination of snare technique and a cold biopsy.  No large polyps, cancer, colitis, vascular malformations or diverticular disease were observed.  Retroflexion was not performed in the rectum.  The patient tolerated the procedure well and there were no complications.  IMPRESSION: 1. Colon polyps removed as described above. 2. Otherwise normal, status post right hemicolectomy.  PLAN:  Await pathology on polyps.Dictated by:   Florencia Reasons, M.D.  Attending  Physician:  Rich Brave DD:  09/17/01 TD:  09/17/01 Job: [id number]

## 2011-02-18 NOTE — Discharge Summary (Signed)
NAME:  [name] [name], [name] [name] NO.:  [id number]   MEDICAL RECORD NO.:  [id number]                    PATIENT TYPE:   LOCATION:                                       FACILITY:   PHYSICIAN:  Elana Alm. Thurston Hole, M.D.              DATE OF BIRTH:  06-21-1935   DATE OF ADMISSION:  07/03/2002  DATE OF DISCHARGE:  07/07/2002                                 DISCHARGE SUMMARY   ADMISSION DIAGNOSES:  1. End-stage degenerative joint disease, left knee.  2. Hypertension.  3. Still's disease.  4. Coronary artery disease.  5. Gout.   DISCHARGE DIAGNOSES:  1. End-stage degenerative joint disease, left knee.  2. Hypertension.  3. Still's disease.  4. Coronary artery disease.  5. Gout.  6. Aortic valve dysfunction.  7. Obesity.   HISTORY OF PRESENT ILLNESS:  The patient is a 76 year old white male with a  history of Still's disease, history of hypertension, history of coronary  artery disease, history of gout, who has end-stage DJD of his left knee. He  has tried anti-inflammatories, cortisone injections, and ________  injections, all without success. At this point in time, he has pain at  night, pain with rest, pain with activity. He understands the risks,  benefits, and possible complications of a left total replacement and is  without question.   HOSPITAL COURSE:  On 07/03/02, the patient underwent a left total knee  replacement by Dr. Thurston Hole. He tolerated the procedure well. Dr. Alanda Amass,  his cardiologist, was consulted to manage his coronary artery disease  postoperatively. On postoperative day #1, surgical wound was well  approximated. His pain was under control with a PCA. Hemoglobin was 12.3.  Surgical wound was well approximated. His dressing was changed. His Foley  was discontinued. His PCA was discontinued. He was started on OxyContin 10  mg twice a day, OxyIR one to two q.4h. p.r.n. pain. On postoperative day #2,  the patient was unable to be moved off  of telemetry floor. On postoperative  day #1, he ambulated six steps, T-max of 99. His hemoglobin was 11.3. His  INR was 1.2. Orders were written for him to be transferred to 5000.  Postoperative day #3, surgical wound was well approximated, the patient was  well controlled on p.o. pain medicine. Surgical wound was well approximated.  His calf was soft. His hemoglobin was 11.1. He was afebrile. Postoperative  day #4, surgical wound well approximated. His INR was 1.5. His surgical  wound was well approximated. His pain is controlled. He was stable from a  cardiac standpoint. He was discharged to home in stable condition.   DISCHARGE MEDICATIONS:  1. OxyIR 5 mg one to two every four to six hours as needed for pain.  2. Coumadin 5 mg one and a half tablets daily.  3. Lopressor 25 mg b.i.d.  4. Tricor 160 mg q.d.  5. Nexium 40 mg q.d.  6. Niaspan 250 mg q.h.s.  7. Restoril 30 mg q.h.s.  8. Altace 5 mg b.i.d.   DISCHARGE INSTRUCTIONS:  He is weight bearing as tolerated on a regular  diet. He will follow up with Dr. Thurston Hole in 10 days from discharge for x-rays  and staple removal. If he has problems with increased pain, increased  drainage before then, he will call.     Kirstin Shepperson, P.A.                  Robert A. Thurston Hole, M.D.    KS/MEDQ  D:  08/19/2002  T:  08/19/2002  Job:  [id number]

## 2011-03-17 ENCOUNTER — Ambulatory Visit (HOSPITAL_COMMUNITY)
Admission: RE | Admit: 2011-03-17 | Discharge: 2011-03-17 | Disposition: A | Discharge status: Home / Self Care | Payer: Medicare Other | Source: Ambulatory Visit | Attending: Urology | Admitting: Urology

## 2011-03-17 ENCOUNTER — Other Ambulatory Visit (HOSPITAL_COMMUNITY): Payer: Self-pay | Admitting: Urology

## 2011-03-17 DIAGNOSIS — N201 Calculus of ureter: Secondary | ICD-10-CM

## 2011-03-17 DIAGNOSIS — R109 Unspecified abdominal pain: Secondary | ICD-10-CM | POA: Insufficient documentation

## 2011-03-17 DIAGNOSIS — N200 Calculus of kidney: Secondary | ICD-10-CM | POA: Insufficient documentation

## 2011-07-07 LAB — POCT I-STAT 4, (NA,K, GLUC, HGB,HCT)
Glucose, Bld: 127 mg/dL — ABNORMAL HIGH (ref 70–99)
Glucose, Bld: 132 mg/dL — ABNORMAL HIGH (ref 70–99)
Glucose, Bld: 135 mg/dL — ABNORMAL HIGH (ref 70–99)
Glucose, Bld: 136 mg/dL — ABNORMAL HIGH (ref 70–99)
Glucose, Bld: 136 mg/dL — ABNORMAL HIGH (ref 70–99)
Glucose, Bld: 139 mg/dL — ABNORMAL HIGH (ref 70–99)
Glucose, Bld: 147 mg/dL — ABNORMAL HIGH (ref 70–99)
Glucose, Bld: 153 mg/dL — ABNORMAL HIGH (ref 70–99)
Glucose, Bld: 226 mg/dL — ABNORMAL HIGH (ref 70–99)
HCT: 28 % — ABNORMAL LOW (ref 39.0–52.0)
HCT: 29 % — ABNORMAL LOW (ref 39.0–52.0)
HCT: 30 % — ABNORMAL LOW (ref 39.0–52.0)
HCT: 30 % — ABNORMAL LOW (ref 39.0–52.0)
HCT: 30 % — ABNORMAL LOW (ref 39.0–52.0)
HCT: 31 % — ABNORMAL LOW (ref 39.0–52.0)
HCT: 33 % — ABNORMAL LOW (ref 39.0–52.0)
HCT: 39 % (ref 39.0–52.0)
HCT: 42 % (ref 39.0–52.0)
Hemoglobin: 10.2 g/dL — ABNORMAL LOW (ref 13.0–17.0)
Hemoglobin: 10.2 g/dL — ABNORMAL LOW (ref 13.0–17.0)
Hemoglobin: 10.2 g/dL — ABNORMAL LOW (ref 13.0–17.0)
Hemoglobin: 10.5 g/dL — ABNORMAL LOW (ref 13.0–17.0)
Hemoglobin: 11.2 g/dL — ABNORMAL LOW (ref 13.0–17.0)
Hemoglobin: 13.3 g/dL (ref 13.0–17.0)
Hemoglobin: 14.3 g/dL (ref 13.0–17.0)
Hemoglobin: 9.5 g/dL — ABNORMAL LOW (ref 13.0–17.0)
Hemoglobin: 9.9 g/dL — ABNORMAL LOW (ref 13.0–17.0)
Potassium: 3.5 mEq/L (ref 3.5–5.1)
Potassium: 3.9 mEq/L (ref 3.5–5.1)
Potassium: 4 mEq/L (ref 3.5–5.1)
Potassium: 4.1 mEq/L (ref 3.5–5.1)
Potassium: 4.3 mEq/L (ref 3.5–5.1)
Potassium: 4.5 mEq/L (ref 3.5–5.1)
Potassium: 4.6 mEq/L (ref 3.5–5.1)
Potassium: 4.7 mEq/L (ref 3.5–5.1)
Potassium: 4.9 mEq/L (ref 3.5–5.1)
Sodium: 132 mEq/L — ABNORMAL LOW (ref 135–145)
Sodium: 132 mEq/L — ABNORMAL LOW (ref 135–145)
Sodium: 133 mEq/L — ABNORMAL LOW (ref 135–145)
Sodium: 133 mEq/L — ABNORMAL LOW (ref 135–145)
Sodium: 133 mEq/L — ABNORMAL LOW (ref 135–145)
Sodium: 133 mEq/L — ABNORMAL LOW (ref 135–145)
Sodium: 135 mEq/L (ref 135–145)
Sodium: 136 mEq/L (ref 135–145)
Sodium: 138 mEq/L (ref 135–145)

## 2011-07-07 LAB — URINALYSIS, ROUTINE W REFLEX MICROSCOPIC
Bilirubin Urine: NEGATIVE
Glucose, UA: NEGATIVE mg/dL
Hgb urine dipstick: NEGATIVE
Ketones, ur: 15 mg/dL — AB
Nitrite: NEGATIVE
Protein, ur: NEGATIVE mg/dL
Specific Gravity, Urine: 1.035 — ABNORMAL HIGH (ref 1.005–1.030)
Urobilinogen, UA: 0.2 mg/dL (ref 0.0–1.0)
pH: 5.5 (ref 5.0–8.0)

## 2011-07-07 LAB — POCT I-STAT 3, ART BLOOD GAS (G3+)
Acid-Base Excess: 2 mmol/L (ref 0.0–2.0)
Acid-Base Excess: 3 mmol/L — ABNORMAL HIGH (ref 0.0–2.0)
Acid-Base Excess: 3 mmol/L — ABNORMAL HIGH (ref 0.0–2.0)
Acid-Base Excess: 3 mmol/L — ABNORMAL HIGH (ref 0.0–2.0)
Acid-base deficit: 1 mmol/L (ref 0.0–2.0)
Acid-base deficit: 1 mmol/L (ref 0.0–2.0)
Bicarbonate: 23.2 mEq/L (ref 20.0–24.0)
Bicarbonate: 24.2 mEq/L — ABNORMAL HIGH (ref 20.0–24.0)
Bicarbonate: 25 mEq/L — ABNORMAL HIGH (ref 20.0–24.0)
Bicarbonate: 25.2 mEq/L — ABNORMAL HIGH (ref 20.0–24.0)
Bicarbonate: 27 mEq/L — ABNORMAL HIGH (ref 20.0–24.0)
Bicarbonate: 27.9 mEq/L — ABNORMAL HIGH (ref 20.0–24.0)
Bicarbonate: 28.6 mEq/L — ABNORMAL HIGH (ref 20.0–24.0)
Bicarbonate: 28.9 mEq/L — ABNORMAL HIGH (ref 20.0–24.0)
O2 Saturation: 100 %
O2 Saturation: 100 %
O2 Saturation: 100 %
O2 Saturation: 100 %
O2 Saturation: 100 %
O2 Saturation: 89 %
O2 Saturation: 94 %
O2 Saturation: 95 %
Patient temperature: 36.8
Patient temperature: 37.3
Patient temperature: 37.4
TCO2: 24 mmol/L (ref 0–100)
TCO2: 25 mmol/L (ref 0–100)
TCO2: 26 mmol/L (ref 0–100)
TCO2: 26 mmol/L (ref 0–100)
TCO2: 28 mmol/L (ref 0–100)
TCO2: 29 mmol/L (ref 0–100)
TCO2: 30 mmol/L (ref 0–100)
TCO2: 30 mmol/L (ref 0–100)
pCO2 arterial: 37.6 mmHg (ref 35.0–45.0)
pCO2 arterial: 38.4 mmHg (ref 35.0–45.0)
pCO2 arterial: 38.8 mmHg (ref 35.0–45.0)
pCO2 arterial: 40.8 mmHg (ref 35.0–45.0)
pCO2 arterial: 46.5 mmHg — ABNORMAL HIGH (ref 35.0–45.0)
pCO2 arterial: 47.2 mmHg — ABNORMAL HIGH (ref 35.0–45.0)
pCO2 arterial: 47.9 mmHg — ABNORMAL HIGH (ref 35.0–45.0)
pCO2 arterial: 50.8 mmHg — ABNORMAL HIGH (ref 35.0–45.0)
pH, Arterial: 7.338 — ABNORMAL LOW (ref 7.350–7.450)
pH, Arterial: 7.363 (ref 7.350–7.450)
pH, Arterial: 7.373 (ref 7.350–7.450)
pH, Arterial: 7.391 (ref 7.350–7.450)
pH, Arterial: 7.391 (ref 7.350–7.450)
pH, Arterial: 7.399 (ref 7.350–7.450)
pH, Arterial: 7.404 (ref 7.350–7.450)
pH, Arterial: 7.464 — ABNORMAL HIGH (ref 7.350–7.450)
pO2, Arterial: 194 mmHg — ABNORMAL HIGH (ref 80.0–100.0)
pO2, Arterial: 200 mmHg — ABNORMAL HIGH (ref 80.0–100.0)
pO2, Arterial: 214 mmHg — ABNORMAL HIGH (ref 80.0–100.0)
pO2, Arterial: 229 mmHg — ABNORMAL HIGH (ref 80.0–100.0)
pO2, Arterial: 260 mmHg — ABNORMAL HIGH (ref 80.0–100.0)
pO2, Arterial: 60 mmHg — ABNORMAL LOW (ref 80.0–100.0)
pO2, Arterial: 72 mmHg — ABNORMAL LOW (ref 80.0–100.0)
pO2, Arterial: 76 mmHg — ABNORMAL LOW (ref 80.0–100.0)

## 2011-07-07 LAB — BLOOD GAS, ARTERIAL
Acid-Base Excess: 0.5 mmol/L (ref 0.0–2.0)
Bicarbonate: 24.7 mEq/L — ABNORMAL HIGH (ref 20.0–24.0)
Drawn by: 206361
FIO2: 0.21 %
O2 Saturation: 96.6 %
Patient temperature: 98.6
TCO2: 25.9 mmol/L (ref 0–100)
pCO2 arterial: 40.3 mmHg (ref 35.0–45.0)
pH, Arterial: 7.404 (ref 7.350–7.450)
pO2, Arterial: 81.3 mmHg (ref 80.0–100.0)

## 2011-07-07 LAB — MAGNESIUM
Magnesium: 2 mg/dL (ref 1.5–2.5)
Magnesium: 2.1 mg/dL (ref 1.5–2.5)
Magnesium: 2.2 mg/dL (ref 1.5–2.5)
Magnesium: 2.3 mg/dL (ref 1.5–2.5)

## 2011-07-07 LAB — BASIC METABOLIC PANEL
BUN: 13 mg/dL (ref 6–23)
BUN: 16 mg/dL (ref 6–23)
BUN: 17 mg/dL (ref 6–23)
BUN: 19 mg/dL (ref 6–23)
CO2: 25 mEq/L (ref 19–32)
CO2: 28 mEq/L (ref 19–32)
CO2: 28 mEq/L (ref 19–32)
CO2: 29 mEq/L (ref 19–32)
Calcium: 8.3 mg/dL — ABNORMAL LOW (ref 8.4–10.5)
Calcium: 8.5 mg/dL (ref 8.4–10.5)
Calcium: 8.7 mg/dL (ref 8.4–10.5)
Calcium: 9.3 mg/dL (ref 8.4–10.5)
Chloride: 105 mEq/L (ref 96–112)
Chloride: 109 mEq/L (ref 96–112)
Chloride: 96 mEq/L (ref 96–112)
Chloride: 97 mEq/L (ref 96–112)
Creatinine, Ser: 1.04 mg/dL (ref 0.4–1.5)
Creatinine, Ser: 1.07 mg/dL (ref 0.4–1.5)
Creatinine, Ser: 1.29 mg/dL (ref 0.4–1.5)
Creatinine, Ser: 1.48 mg/dL (ref 0.4–1.5)
GFR calc Af Amer: 57 mL/min — ABNORMAL LOW (ref >60)
GFR calc Af Amer: >60 mL/min (ref >60)
GFR calc Af Amer: >60 mL/min (ref >60)
GFR calc Af Amer: >60 mL/min (ref >60)
GFR calc non Af Amer: 47 mL/min — ABNORMAL LOW (ref >60)
GFR calc non Af Amer: 55 mL/min — ABNORMAL LOW (ref >60)
GFR calc non Af Amer: >60 mL/min (ref >60)
GFR calc non Af Amer: >60 mL/min (ref >60)
Glucose, Bld: 119 mg/dL — ABNORMAL HIGH (ref 70–99)
Glucose, Bld: 120 mg/dL — ABNORMAL HIGH (ref 70–99)
Glucose, Bld: 138 mg/dL — ABNORMAL HIGH (ref 70–99)
Glucose, Bld: 148 mg/dL — ABNORMAL HIGH (ref 70–99)
Potassium: 3.8 mEq/L (ref 3.5–5.1)
Potassium: 3.8 mEq/L (ref 3.5–5.1)
Potassium: 4.2 mEq/L (ref 3.5–5.1)
Potassium: 4.4 mEq/L (ref 3.5–5.1)
Sodium: 131 mEq/L — ABNORMAL LOW (ref 135–145)
Sodium: 135 mEq/L (ref 135–145)
Sodium: 140 mEq/L (ref 135–145)
Sodium: 142 mEq/L (ref 135–145)

## 2011-07-07 LAB — CREATININE, SERUM
Creatinine, Ser: 1.26 mg/dL (ref 0.4–1.5)
Creatinine, Ser: 1.89 mg/dL — ABNORMAL HIGH (ref 0.4–1.5)
GFR calc Af Amer: 43 mL/min — ABNORMAL LOW (ref >60)
GFR calc Af Amer: >60 mL/min (ref >60)
GFR calc non Af Amer: 35 mL/min — ABNORMAL LOW (ref >60)
GFR calc non Af Amer: 56 mL/min — ABNORMAL LOW (ref >60)

## 2011-07-07 LAB — TYPE AND SCREEN
ABO/RH(D): A POS
Antibody Screen: NEGATIVE

## 2011-07-07 LAB — CBC
HCT: 27.2 % — ABNORMAL LOW (ref 39.0–52.0)
HCT: 29.1 % — ABNORMAL LOW (ref 39.0–52.0)
HCT: 29.1 % — ABNORMAL LOW (ref 39.0–52.0)
HCT: 30.1 % — ABNORMAL LOW (ref 39.0–52.0)
HCT: 30.5 % — ABNORMAL LOW (ref 39.0–52.0)
HCT: 31 % — ABNORMAL LOW (ref 39.0–52.0)
HCT: 34.2 % — ABNORMAL LOW (ref 39.0–52.0)
HCT: 42 % (ref 39.0–52.0)
Hemoglobin: 10 g/dL — ABNORMAL LOW (ref 13.0–17.0)
Hemoglobin: 10.1 g/dL — ABNORMAL LOW (ref 13.0–17.0)
Hemoglobin: 10.3 g/dL — ABNORMAL LOW (ref 13.0–17.0)
Hemoglobin: 10.4 g/dL — ABNORMAL LOW (ref 13.0–17.0)
Hemoglobin: 10.6 g/dL — ABNORMAL LOW (ref 13.0–17.0)
Hemoglobin: 11.6 g/dL — ABNORMAL LOW (ref 13.0–17.0)
Hemoglobin: 14.3 g/dL (ref 13.0–17.0)
Hemoglobin: 9.7 g/dL — ABNORMAL LOW (ref 13.0–17.0)
MCHC: 33.9 g/dL (ref 30.0–36.0)
MCHC: 33.9 g/dL (ref 30.0–36.0)
MCHC: 34 g/dL (ref 30.0–36.0)
MCHC: 34.1 g/dL (ref 30.0–36.0)
MCHC: 34.3 g/dL (ref 30.0–36.0)
MCHC: 34.3 g/dL (ref 30.0–36.0)
MCHC: 34.6 g/dL (ref 30.0–36.0)
MCHC: 35.7 g/dL (ref 30.0–36.0)
MCV: 97.3 fL (ref 78.0–100.0)
MCV: 98.6 fL (ref 78.0–100.0)
MCV: 98.6 fL (ref 78.0–100.0)
MCV: 98.7 fL (ref 78.0–100.0)
MCV: 98.9 fL (ref 78.0–100.0)
MCV: 98.9 fL (ref 78.0–100.0)
MCV: 99.1 fL (ref 78.0–100.0)
MCV: 99.2 fL (ref 78.0–100.0)
Platelets: 116 10*3/uL — ABNORMAL LOW (ref 150–400)
Platelets: 118 10*3/uL — ABNORMAL LOW (ref 150–400)
Platelets: 122 10*3/uL — ABNORMAL LOW (ref 150–400)
Platelets: 130 10*3/uL — ABNORMAL LOW (ref 150–400)
Platelets: 133 10*3/uL — ABNORMAL LOW (ref 150–400)
Platelets: 141 10*3/uL — ABNORMAL LOW (ref 150–400)
Platelets: 204 10*3/uL (ref 150–400)
Platelets: 245 10*3/uL (ref 150–400)
RBC: 2.8 MIL/uL — ABNORMAL LOW (ref 4.22–5.81)
RBC: 2.93 MIL/uL — ABNORMAL LOW (ref 4.22–5.81)
RBC: 2.96 MIL/uL — ABNORMAL LOW (ref 4.22–5.81)
RBC: 3.04 MIL/uL — ABNORMAL LOW (ref 4.22–5.81)
RBC: 3.08 MIL/uL — ABNORMAL LOW (ref 4.22–5.81)
RBC: 3.15 MIL/uL — ABNORMAL LOW (ref 4.22–5.81)
RBC: 3.47 MIL/uL — ABNORMAL LOW (ref 4.22–5.81)
RBC: 4.25 MIL/uL (ref 4.22–5.81)
RDW: 12.9 % (ref 11.5–15.5)
RDW: 13.1 % (ref 11.5–15.5)
RDW: 13.1 % (ref 11.5–15.5)
RDW: 13.2 % (ref 11.5–15.5)
RDW: 13.3 % (ref 11.5–15.5)
RDW: 13.4 % (ref 11.5–15.5)
RDW: 13.4 % (ref 11.5–15.5)
RDW: 13.6 % (ref 11.5–15.5)
WBC: 10.2 10*3/uL (ref 4.0–10.5)
WBC: 6 10*3/uL (ref 4.0–10.5)
WBC: 6.5 10*3/uL (ref 4.0–10.5)
WBC: 7.6 10*3/uL (ref 4.0–10.5)
WBC: 8.6 10*3/uL (ref 4.0–10.5)
WBC: 9.3 10*3/uL (ref 4.0–10.5)
WBC: 9.8 10*3/uL (ref 4.0–10.5)
WBC: 9.8 10*3/uL (ref 4.0–10.5)

## 2011-07-07 LAB — PREPARE FRESH FROZEN PLASMA

## 2011-07-07 LAB — GLUCOSE, CAPILLARY
Glucose-Capillary: 101 mg/dL — ABNORMAL HIGH (ref 70–99)
Glucose-Capillary: 103 mg/dL — ABNORMAL HIGH (ref 70–99)
Glucose-Capillary: 104 mg/dL — ABNORMAL HIGH (ref 70–99)
Glucose-Capillary: 106 mg/dL — ABNORMAL HIGH (ref 70–99)
Glucose-Capillary: 110 mg/dL — ABNORMAL HIGH (ref 70–99)
Glucose-Capillary: 111 mg/dL — ABNORMAL HIGH (ref 70–99)
Glucose-Capillary: 113 mg/dL — ABNORMAL HIGH (ref 70–99)
Glucose-Capillary: 117 mg/dL — ABNORMAL HIGH (ref 70–99)
Glucose-Capillary: 118 mg/dL — ABNORMAL HIGH (ref 70–99)
Glucose-Capillary: 121 mg/dL — ABNORMAL HIGH (ref 70–99)
Glucose-Capillary: 123 mg/dL — ABNORMAL HIGH (ref 70–99)
Glucose-Capillary: 126 mg/dL — ABNORMAL HIGH (ref 70–99)
Glucose-Capillary: 127 mg/dL — ABNORMAL HIGH (ref 70–99)
Glucose-Capillary: 127 mg/dL — ABNORMAL HIGH (ref 70–99)
Glucose-Capillary: 129 mg/dL — ABNORMAL HIGH (ref 70–99)
Glucose-Capillary: 131 mg/dL — ABNORMAL HIGH (ref 70–99)
Glucose-Capillary: 133 mg/dL — ABNORMAL HIGH (ref 70–99)
Glucose-Capillary: 139 mg/dL — ABNORMAL HIGH (ref 70–99)
Glucose-Capillary: 146 mg/dL — ABNORMAL HIGH (ref 70–99)
Glucose-Capillary: 146 mg/dL — ABNORMAL HIGH (ref 70–99)
Glucose-Capillary: 147 mg/dL — ABNORMAL HIGH (ref 70–99)
Glucose-Capillary: 148 mg/dL — ABNORMAL HIGH (ref 70–99)
Glucose-Capillary: 151 mg/dL — ABNORMAL HIGH (ref 70–99)
Glucose-Capillary: 155 mg/dL — ABNORMAL HIGH (ref 70–99)
Glucose-Capillary: 157 mg/dL — ABNORMAL HIGH (ref 70–99)
Glucose-Capillary: 158 mg/dL — ABNORMAL HIGH (ref 70–99)
Glucose-Capillary: 159 mg/dL — ABNORMAL HIGH (ref 70–99)
Glucose-Capillary: 166 mg/dL — ABNORMAL HIGH (ref 70–99)
Glucose-Capillary: 167 mg/dL — ABNORMAL HIGH (ref 70–99)
Glucose-Capillary: 174 mg/dL — ABNORMAL HIGH (ref 70–99)
Glucose-Capillary: 178 mg/dL — ABNORMAL HIGH (ref 70–99)
Glucose-Capillary: 178 mg/dL — ABNORMAL HIGH (ref 70–99)
Glucose-Capillary: 99 mg/dL (ref 70–99)

## 2011-07-07 LAB — APTT
aPTT: 25 seconds (ref 24–37)
aPTT: 34 seconds (ref 24–37)

## 2011-07-07 LAB — POCT I-STAT, CHEM 8
BUN: 13 mg/dL (ref 6–23)
BUN: 19 mg/dL (ref 6–23)
Calcium, Ion: 1.17 mmol/L (ref 1.12–1.32)
Calcium, Ion: 1.19 mmol/L (ref 1.12–1.32)
Chloride: 100 mEq/L (ref 96–112)
Chloride: 104 mEq/L (ref 96–112)
Creatinine, Ser: 1.3 mg/dL (ref 0.4–1.5)
Creatinine, Ser: 1.9 mg/dL — ABNORMAL HIGH (ref 0.4–1.5)
Glucose, Bld: 145 mg/dL — ABNORMAL HIGH (ref 70–99)
Glucose, Bld: 177 mg/dL — ABNORMAL HIGH (ref 70–99)
HCT: 30 % — ABNORMAL LOW (ref 39.0–52.0)
HCT: 31 % — ABNORMAL LOW (ref 39.0–52.0)
Hemoglobin: 10.2 g/dL — ABNORMAL LOW (ref 13.0–17.0)
Hemoglobin: 10.5 g/dL — ABNORMAL LOW (ref 13.0–17.0)
Potassium: 4.2 mEq/L (ref 3.5–5.1)
Potassium: 4.8 mEq/L (ref 3.5–5.1)
Sodium: 138 mEq/L (ref 135–145)
Sodium: 139 mEq/L (ref 135–145)
TCO2: 21 mmol/L (ref 0–100)
TCO2: 25 mmol/L (ref 0–100)

## 2011-07-07 LAB — COMPREHENSIVE METABOLIC PANEL
ALT: 26 U/L (ref 0–53)
AST: 33 U/L (ref 0–37)
Albumin: 3.8 g/dL (ref 3.5–5.2)
Alkaline Phosphatase: 27 U/L — ABNORMAL LOW (ref 39–117)
BUN: 7 mg/dL (ref 6–23)
CO2: 24 mEq/L (ref 19–32)
Calcium: 9.3 mg/dL (ref 8.4–10.5)
Chloride: 106 mEq/L (ref 96–112)
Creatinine, Ser: 0.83 mg/dL (ref 0.4–1.5)
GFR calc Af Amer: >60 mL/min (ref >60)
GFR calc non Af Amer: >60 mL/min (ref >60)
Glucose, Bld: 105 mg/dL — ABNORMAL HIGH (ref 70–99)
Potassium: 4.3 mEq/L (ref 3.5–5.1)
Sodium: 138 mEq/L (ref 135–145)
Total Bilirubin: 0.7 mg/dL (ref 0.3–1.2)
Total Protein: 6 g/dL (ref 6.0–8.3)

## 2011-07-07 LAB — POCT I-STAT 3, VENOUS BLOOD GAS (G3P V)
Acid-Base Excess: 2 mmol/L (ref 0.0–2.0)
Bicarbonate: 27.9 mEq/L — ABNORMAL HIGH (ref 20.0–24.0)
O2 Saturation: 81 %
TCO2: 29 mmol/L (ref 0–100)
pCO2, Ven: 48.8 mmHg (ref 45.0–50.0)
pH, Ven: 7.366 — ABNORMAL HIGH (ref 7.250–7.300)
pO2, Ven: 48 mmHg — ABNORMAL HIGH (ref 30.0–45.0)

## 2011-07-07 LAB — ABO/RH: ABO/RH(D): A POS

## 2011-07-07 LAB — PROTIME-INR
INR: 1 (ref 0.00–1.49)
INR: 1.2 (ref 0.00–1.49)
INR: 1.4 (ref 0.00–1.49)
Prothrombin Time: 12.9 seconds (ref 11.6–15.2)
Prothrombin Time: 15.4 seconds — ABNORMAL HIGH (ref 11.6–15.2)
Prothrombin Time: 17.4 seconds — ABNORMAL HIGH (ref 11.6–15.2)

## 2011-07-07 LAB — PREPARE PLATELET PHERESIS

## 2011-07-07 LAB — HEMATOCRIT: HCT: 31.4 % — ABNORMAL LOW (ref 39.0–52.0)

## 2011-07-07 LAB — HEMOGLOBIN A1C
Hgb A1c MFr Bld: 6 % (ref 4.6–6.1)
Mean Plasma Glucose: 126 mg/dL

## 2011-07-07 LAB — PLATELET COUNT: Platelets: 153 10*3/uL (ref 150–400)

## 2011-07-07 LAB — HEMOGLOBIN: Hemoglobin: 10.5 g/dL — ABNORMAL LOW (ref 13.0–17.0)

## 2012-04-02 HISTORY — PX: NM MYOVIEW LTD: HXRAD82

## 2012-04-06 DIAGNOSIS — R079 Chest pain, unspecified: Secondary | ICD-10-CM

## 2012-11-05 ENCOUNTER — Other Ambulatory Visit: Payer: Self-pay | Admitting: Orthopedic Surgery

## 2012-11-05 MED ORDER — DEXAMETHASONE SODIUM PHOSPHATE 10 MG/ML IJ SOLN: 10.0000 mg | Freq: Once | INTRAMUSCULAR | Status: DC

## 2012-11-05 MED ORDER — BUPIVACAINE LIPOSOME 1.3 % IJ SUSP: 20.0000 mL | Freq: Once | INTRAMUSCULAR | Status: DC

## 2012-11-05 NOTE — Progress Notes (Signed)
Preoperative surgical orders have been place into the Epic hospital system for [name] [name] on 11/05/2012, 8:24 AM  by Patrica Duel for surgery on 12/05/2012.  Preop Total Knee orders including Experal, IV Tylenol, and IV Decadron as long as there are no contraindications to the above medications. Avel Peace, PA-C

## 2012-11-20 ENCOUNTER — Encounter (HOSPITAL_COMMUNITY): Payer: Self-pay | Admitting: Pharmacy Technician

## 2012-11-21 ENCOUNTER — Encounter (HOSPITAL_COMMUNITY): Payer: Self-pay

## 2012-11-21 ENCOUNTER — Ambulatory Visit (HOSPITAL_COMMUNITY)
Admission: RE | Admit: 2012-11-21 | Discharge: 2012-11-21 | Disposition: A | Discharge status: Home / Self Care | Payer: Medicare Other | Source: Ambulatory Visit | Attending: Orthopedic Surgery | Admitting: Orthopedic Surgery

## 2012-11-21 ENCOUNTER — Encounter (HOSPITAL_COMMUNITY)
Admission: RE | Admit: 2012-11-21 | Discharge: 2012-11-21 | Disposition: A | Discharge status: Home / Self Care | Payer: Medicare Other | Source: Ambulatory Visit | Attending: Orthopedic Surgery | Admitting: Orthopedic Surgery

## 2012-11-21 DIAGNOSIS — G4730 Sleep apnea, unspecified: Secondary | ICD-10-CM | POA: Insufficient documentation

## 2012-11-21 DIAGNOSIS — I10 Essential (primary) hypertension: Secondary | ICD-10-CM | POA: Insufficient documentation

## 2012-11-21 DIAGNOSIS — E785 Hyperlipidemia, unspecified: Secondary | ICD-10-CM | POA: Insufficient documentation

## 2012-11-21 DIAGNOSIS — Z951 Presence of aortocoronary bypass graft: Secondary | ICD-10-CM | POA: Insufficient documentation

## 2012-11-21 DIAGNOSIS — J984 Other disorders of lung: Secondary | ICD-10-CM | POA: Insufficient documentation

## 2012-11-21 DIAGNOSIS — X58XXXA Exposure to other specified factors, initial encounter: Secondary | ICD-10-CM | POA: Insufficient documentation

## 2012-11-21 DIAGNOSIS — Z96659 Presence of unspecified artificial knee joint: Secondary | ICD-10-CM | POA: Insufficient documentation

## 2012-11-21 DIAGNOSIS — Z954 Presence of other heart-valve replacement: Secondary | ICD-10-CM | POA: Insufficient documentation

## 2012-11-21 DIAGNOSIS — Z01812 Encounter for preprocedural laboratory examination: Secondary | ICD-10-CM | POA: Insufficient documentation

## 2012-11-21 DIAGNOSIS — I2510 Atherosclerotic heart disease of native coronary artery without angina pectoris: Secondary | ICD-10-CM | POA: Insufficient documentation

## 2012-11-21 DIAGNOSIS — K219 Gastro-esophageal reflux disease without esophagitis: Secondary | ICD-10-CM | POA: Insufficient documentation

## 2012-11-21 DIAGNOSIS — T84029A Dislocation of unspecified internal joint prosthesis, initial encounter: Secondary | ICD-10-CM | POA: Insufficient documentation

## 2012-11-21 DIAGNOSIS — E119 Type 2 diabetes mellitus without complications: Secondary | ICD-10-CM | POA: Insufficient documentation

## 2012-11-21 HISTORY — DX: Type 2 diabetes mellitus without complications: E11.9

## 2012-11-21 HISTORY — DX: Sleep apnea, unspecified: G47.30

## 2012-11-21 HISTORY — DX: Personal history of other diseases of the musculoskeletal system and connective tissue: Z87.39

## 2012-11-21 HISTORY — DX: Personal history of urinary calculi: Z87.442

## 2012-11-21 HISTORY — DX: Other specified disorders of eye and adnexa: H57.89

## 2012-11-21 HISTORY — DX: Gastro-esophageal reflux disease without esophagitis: K21.9

## 2012-11-21 HISTORY — DX: Hyperlipidemia, unspecified: E78.5

## 2012-11-21 HISTORY — DX: Essential (primary) hypertension: I10

## 2012-11-21 HISTORY — DX: Atherosclerotic heart disease of native coronary artery without angina pectoris: I25.10

## 2012-11-21 LAB — COMPREHENSIVE METABOLIC PANEL
ALT: 27 U/L (ref 0–53)
Alkaline Phosphatase: 46 U/L (ref 39–117)
BUN: 9 mg/dL (ref 6–23)
CO2: 26 mEq/L (ref 19–32)
Chloride: 101 mEq/L (ref 96–112)
GFR calc Af Amer: >90 mL/min (ref >90)
GFR calc non Af Amer: 86 mL/min — ABNORMAL LOW (ref >90)
Glucose, Bld: 118 mg/dL — ABNORMAL HIGH (ref 70–99)
Potassium: 4.1 mEq/L (ref 3.5–5.1)
Sodium: 139 mEq/L (ref 135–145)
Total Bilirubin: 0.7 mg/dL (ref 0.3–1.2)

## 2012-11-21 LAB — URINALYSIS, ROUTINE W REFLEX MICROSCOPIC
Bilirubin Urine: NEGATIVE
Glucose, UA: NEGATIVE mg/dL
Hgb urine dipstick: NEGATIVE
Protein, ur: 100 mg/dL — AB
Urobilinogen, UA: 1 mg/dL (ref 0.0–1.0)

## 2012-11-21 LAB — CBC
HCT: 45.5 % (ref 39.0–52.0)
Hemoglobin: 15.6 g/dL (ref 13.0–17.0)
MCHC: 34.3 g/dL (ref 30.0–36.0)
RBC: 4.8 MIL/uL (ref 4.22–5.81)
WBC: 8.2 10*3/uL (ref 4.0–10.5)

## 2012-11-21 LAB — PROTIME-INR: Prothrombin Time: 13.7 seconds (ref 11.6–15.2)

## 2012-11-21 LAB — SURGICAL PCR SCREEN: MRSA, PCR: NEGATIVE

## 2012-11-21 LAB — URINE MICROSCOPIC-ADD ON

## 2012-11-21 LAB — APTT: aPTT: 29 seconds (ref 24–37)

## 2012-11-21 NOTE — Patient Instructions (Addendum)
[name] [name]  11/21/2012                           YOUR PROCEDURE IS SCHEDULED ON: 12/05/12               PLEASE REPORT TO SHORT STAY CENTER AT : 5:30 AM               CALL THIS NUMBER IF ANY PROBLEMS THE DAY OF SURGERY :               [phone]                      REMEMBER:   Do not eat food or drink liquids AFTER MIDNIGHT   Take these medicines the morning of surgery with A SIP OF WATER: TRAMADOL / ARICEPT / LOPRESSOR /ALLOPURINOL / COLCHICINE   Do not wear jewelry, make-up   Do not wear lotions, powders, or perfumes.   Do not shave legs or underarms 12 hrs. before surgery (men may shave face)  Do not bring valuables to the hospital.  Contacts, dentures or bridgework may not be worn into surgery.  Leave suitcase in the car. After surgery it may be brought to your room.  For patients admitted to the hospital more than one night, checkout time is 11:00                          The day of discharge.   Patients discharged the day of surgery will not be allowed to drive home                             If going home same day of surgery, must have someone stay with you first                           24 hrs at home and arrange for some one to drive you home from hospital.    Special Instructions:   Please read over the following fact sheets that you were given:               1. MRSA  INFORMATION                      2. [facility] PREPARING FOR SURGERY SHEET               3. INCENTIVE SPIROMETER               4. BRING C-PAP MASK AND TUBING TO HOSPITAL                                                X_____________________________________________________________________        Failure to follow these instructions may result in cancellation of your surgery

## 2012-11-23 NOTE — Progress Notes (Signed)
UA faxed to Dr. Aluisio - confirmation recieved 

## 2012-12-05 ENCOUNTER — Inpatient Hospital Stay (HOSPITAL_COMMUNITY): Admission: RE | Admit: 2012-12-05 | Payer: Medicare Other | Source: Ambulatory Visit | Admitting: Orthopedic Surgery

## 2012-12-05 ENCOUNTER — Encounter (HOSPITAL_COMMUNITY): Admission: RE | Payer: Self-pay | Source: Ambulatory Visit

## 2012-12-05 SURGERY — TOTAL KNEE REVISION
Anesthesia: Choice | Site: Knee | Laterality: Right

## 2012-12-18 ENCOUNTER — Other Ambulatory Visit (HOSPITAL_COMMUNITY): Payer: Self-pay | Admitting: Cardiovascular Disease

## 2012-12-18 DIAGNOSIS — I509 Heart failure, unspecified: Secondary | ICD-10-CM

## 2012-12-18 DIAGNOSIS — Z952 Presence of prosthetic heart valve: Secondary | ICD-10-CM

## 2012-12-25 ENCOUNTER — Ambulatory Visit (HOSPITAL_COMMUNITY)
Admission: RE | Admit: 2012-12-25 | Discharge: 2012-12-25 | Disposition: A | Discharge status: Home / Self Care | Payer: Medicare Other | Source: Ambulatory Visit | Attending: Cardiovascular Disease | Admitting: Cardiovascular Disease

## 2012-12-25 DIAGNOSIS — G4733 Obstructive sleep apnea (adult) (pediatric): Secondary | ICD-10-CM | POA: Insufficient documentation

## 2012-12-25 DIAGNOSIS — E119 Type 2 diabetes mellitus without complications: Secondary | ICD-10-CM | POA: Insufficient documentation

## 2012-12-25 DIAGNOSIS — I5030 Unspecified diastolic (congestive) heart failure: Secondary | ICD-10-CM | POA: Insufficient documentation

## 2012-12-25 DIAGNOSIS — I700 Atherosclerosis of aorta: Secondary | ICD-10-CM | POA: Insufficient documentation

## 2012-12-25 DIAGNOSIS — I079 Rheumatic tricuspid valve disease, unspecified: Secondary | ICD-10-CM | POA: Insufficient documentation

## 2012-12-25 DIAGNOSIS — I2510 Atherosclerotic heart disease of native coronary artery without angina pectoris: Secondary | ICD-10-CM | POA: Insufficient documentation

## 2012-12-25 DIAGNOSIS — I359 Nonrheumatic aortic valve disorder, unspecified: Secondary | ICD-10-CM | POA: Insufficient documentation

## 2012-12-25 DIAGNOSIS — Z952 Presence of prosthetic heart valve: Secondary | ICD-10-CM

## 2012-12-25 DIAGNOSIS — I509 Heart failure, unspecified: Secondary | ICD-10-CM | POA: Insufficient documentation

## 2012-12-25 DIAGNOSIS — I059 Rheumatic mitral valve disease, unspecified: Secondary | ICD-10-CM | POA: Insufficient documentation

## 2012-12-25 DIAGNOSIS — I10 Essential (primary) hypertension: Secondary | ICD-10-CM | POA: Insufficient documentation

## 2012-12-25 DIAGNOSIS — Z954 Presence of other heart-valve replacement: Secondary | ICD-10-CM | POA: Insufficient documentation

## 2012-12-25 DIAGNOSIS — I519 Heart disease, unspecified: Secondary | ICD-10-CM | POA: Insufficient documentation

## 2012-12-25 DIAGNOSIS — E669 Obesity, unspecified: Secondary | ICD-10-CM | POA: Insufficient documentation

## 2012-12-25 NOTE — Progress Notes (Signed)
[facility]   2D echo completed 12/25/2012.   Cindy Merve Hotard, RDCS  

## 2013-02-14 ENCOUNTER — Telehealth (HOSPITAL_COMMUNITY): Payer: Self-pay | Admitting: Vascular Surgery

## 2013-03-21 ENCOUNTER — Encounter: Payer: Self-pay | Admitting: Cardiovascular Disease

## 2013-03-22 NOTE — Progress Notes (Signed)
Surgery scheduled for 04/10/13.  Need orders in EPIC.  Thank You.

## 2013-03-27 ENCOUNTER — Other Ambulatory Visit: Payer: Self-pay | Admitting: Cardiovascular Disease

## 2013-03-27 DIAGNOSIS — I701 Atherosclerosis of renal artery: Secondary | ICD-10-CM

## 2013-03-27 NOTE — Progress Notes (Signed)
Surgery scheduled for 04/10/13.  Preop appointment on 04/02/13 at 1000am.  Need orders in [facility].  Thank You.

## 2013-03-29 ENCOUNTER — Encounter (HOSPITAL_COMMUNITY): Payer: Self-pay | Admitting: Pharmacy Technician

## 2013-03-31 ENCOUNTER — Other Ambulatory Visit: Payer: Self-pay | Admitting: Orthopedic Surgery

## 2013-03-31 MED ORDER — BUPIVACAINE LIPOSOME 1.3 % IJ SUSP: 20.0000 mL | Freq: Once | INTRAMUSCULAR | Status: DC

## 2013-03-31 MED ORDER — DEXAMETHASONE SODIUM PHOSPHATE 10 MG/ML IJ SOLN: 10.0000 mg | Freq: Once | INTRAMUSCULAR | Status: DC

## 2013-03-31 NOTE — Progress Notes (Signed)
Preoperative surgical orders have been place into the Epic hospital system for [name] [name] on 03/31/2013, 4:26 PM  by Patrica Duel for surgery on 04/10/2013.  Preop Total Knee Revision orders including Experal, IV Tylenol, and IV Decadron as long as there are no contraindications to the above medications. [name] Peace, PA-C

## 2013-04-01 NOTE — Progress Notes (Signed)
EKG 03/18/13 on chart and on EPIC, LOV note 12/14/12 Dr. Alanda Amass on chart, Stress test 04/11/12 on chart, surgery clearance note 01/10/13 on chart, chest x-ray 11/11/12 on EPIC

## 2013-04-01 NOTE — Patient Instructions (Addendum)
[name]  04/01/2013   Your procedure is scheduled on: 04/10/13  Report to [facility] at 8:00 AM.  Call this number if you have problems the morning of surgery [phone]   Remember: Stop Aspirin, B complex, multi vitamin, plavix, omega three, vitamin c, vitamin e on 04/04/13   Do not eat food or drink liquids After Midnight.     Take these medicines the morning of surgery with A SIP OF WATER: metoprolol/lopressor   Do not wear jewelry, make-up or nail polish.  Do not wear lotions, powders, or perfumes. You may wear deodorant.  Do not shave 48 hours prior to surgery. Men may shave face and neck.  Do not bring valuables to the hospital.  Contacts, dentures or bridgework may not be worn into surgery.  Leave suitcase in the car. After surgery it may be brought to your room.  For patients admitted to the hospital, checkout time is 11:00 AM the day of discharge.   Please read over the following fact sheets that you were given: MRSA Information, blood fact sheet, incentive spirometry fact sheet Birdie Sons, RN  pre op nurse call if needed [phone]    FAILURE TO FOLLOW THESE INSTRUCTIONS MAY RESULT IN CANCELLATION OF YOUR SURGERY   Patient Signature: ___________________________________________

## 2013-04-02 ENCOUNTER — Encounter (HOSPITAL_COMMUNITY)
Admission: RE | Admit: 2013-04-02 | Discharge: 2013-04-02 | Disposition: A | Discharge status: Home / Self Care | Payer: Medicare Other | Source: Ambulatory Visit | Attending: Orthopedic Surgery | Admitting: Orthopedic Surgery

## 2013-04-02 ENCOUNTER — Ambulatory Visit (HOSPITAL_COMMUNITY)
Admission: RE | Admit: 2013-04-02 | Discharge: 2013-04-02 | Disposition: A | Discharge status: Home / Self Care | Payer: Medicare Other | Source: Ambulatory Visit | Attending: Cardiovascular Disease | Admitting: Cardiovascular Disease

## 2013-04-02 ENCOUNTER — Encounter (HOSPITAL_COMMUNITY): Payer: Self-pay

## 2013-04-02 DIAGNOSIS — I701 Atherosclerosis of renal artery: Secondary | ICD-10-CM

## 2013-04-02 HISTORY — DX: Adverse effect of unspecified anesthetic, initial encounter: T41.45XA

## 2013-04-02 HISTORY — DX: Unspecified osteoarthritis, unspecified site: M19.90

## 2013-04-02 HISTORY — DX: Other complications of anesthesia, initial encounter: T88.59XA

## 2013-04-02 HISTORY — DX: Pneumonia, unspecified organism: J18.9

## 2013-04-02 HISTORY — DX: Juvenile rheumatoid arthritis with systemic onset, unspecified site: M08.20

## 2013-04-02 LAB — CBC
HCT: 42.6 % (ref 39.0–52.0)
MCV: 94.5 fL (ref 78.0–100.0)
Platelets: 232 10*3/uL (ref 150–400)
RBC: 4.51 MIL/uL (ref 4.22–5.81)
WBC: 14.9 10*3/uL — ABNORMAL HIGH (ref 4.0–10.5)

## 2013-04-02 LAB — URINE MICROSCOPIC-ADD ON

## 2013-04-02 LAB — URINALYSIS, ROUTINE W REFLEX MICROSCOPIC
Glucose, UA: 100 mg/dL — AB
Hgb urine dipstick: NEGATIVE
Ketones, ur: NEGATIVE mg/dL
Nitrite: NEGATIVE
Specific Gravity, Urine: 1.027 (ref 1.005–1.030)
pH: 5.5 (ref 5.0–8.0)

## 2013-04-02 LAB — PROTIME-INR: INR: 0.98 (ref 0.00–1.49)

## 2013-04-02 LAB — COMPREHENSIVE METABOLIC PANEL
AST: 24 U/L (ref 0–37)
Albumin: 3.3 g/dL — ABNORMAL LOW (ref 3.5–5.2)
Alkaline Phosphatase: 65 U/L (ref 39–117)
CO2: 23 mEq/L (ref 19–32)
Chloride: 98 mEq/L (ref 96–112)
Creatinine, Ser: 0.71 mg/dL (ref 0.50–1.35)
GFR calc non Af Amer: 89 mL/min — ABNORMAL LOW (ref >90)
Potassium: 3.8 mEq/L (ref 3.5–5.1)
Total Bilirubin: 0.5 mg/dL (ref 0.3–1.2)

## 2013-04-02 LAB — ABO/RH: ABO/RH(D): A POS

## 2013-04-02 LAB — APTT: aPTT: 23 seconds — ABNORMAL LOW (ref 24–37)

## 2013-04-02 NOTE — Progress Notes (Signed)
Renal Artery Duplex Completed. °Brianna L Mazza ° °

## 2013-04-09 NOTE — H&P (Signed)
TOTAL KNEE REVISION ADMISSION H&P  Patient is being admitted for right revision total knee arthroplasty.  Subjective:  Chief Complaint:right knee pain.  HPI: [name] [name], 78 y.o. male, has a history of pain and functional disability in the right knee(s) due to failed previous arthroplasty and patient has failed non-surgical conservative treatments for greater than 12 weeks to include NSAID's and/or analgesics, flexibility and strengthening excercises, use of assistive devices and activity modification. The indications for the revision of the total knee arthroplasty are loosening of one or more components perhaps to lack of bony ingrowth into the prosthesis. Onset of symptoms was gradual starting since initial TKA in 1997. years ago with gradually worsening course since that time.  Prior procedures on the right knee(s) include arthroplasty.  Patient currently rates pain in the right knee(s) at 8 out of 10 with activity. There is night pain, worsening of pain with activity and weight bearing, pain that interferes with activities of daily living, pain with passive range of motion and joint swelling.  Patient has evidence of prosthetic loosening by imaging studies. This condition presents safety issues increasing the risk of falls. This patient has had failure of right total knee arthroplasty.  There is no current active infection.   Past Medical History  Diagnosis Date  . Coronary artery disease   . Hypertension   . Hyperlipidemia   . Heart murmur   . Diabetes mellitus without complication   . GERD (gastroesophageal reflux disease)   . History of kidney stones   . History of gout   . Sleep apnea     USES C-PAP  . Cyst of eye     X3 CYSTS in left eyeHX OF LOSS OF VISION - RESOLVED AT PRESENT  . Pneumonia     hx of  . Arthritis   . Still's disease 1997    in remission  . Complication of anesthesia     can not pee after surgery    Past Surgical History  Procedure Laterality Date  .  Joint replacement  1996/2005    bil  . Lung mass  1965    benign mass removed  . Colon resection      benign mass  . Hiatal hernia repair  2000  . Aortic valve replacement  2009  . Back surgery  2011  . Coronary artery bypass graft  2000    3 VESSELLS  . Lithotripsy    . Tonsillectomy  at 78 years old  . Hammer toe surgery Right     x2  . Bunionectomy Left   . Knee arthroscopy Right      Current outpatient prescriptions: allopurinol (ZYLOPRIM) 100 MG tablet, Take 100 mg by mouth daily. , Disp: , Rfl: ;  aspirin EC 81 MG tablet, Take 162 mg by mouth every evening., Disp: , Rfl: ;  B Complex-C (B-COMPLEX WITH VITAMIN C) tablet, Take 1 tablet by mouth daily., Disp: , Rfl: ;  clopidogrel (PLAVIX) 75 MG tablet, Take 75 mg by mouth daily before breakfast., Disp: , Rfl:  donepezil (ARICEPT) 10 MG tablet, Take 10 mg by mouth daily before breakfast., Disp: , Rfl: ;  FLUoxetine (PROZAC) 20 MG capsule, Take 20 mg by mouth every evening., Disp: , Rfl: ;  metFORMIN (GLUCOPHAGE) 500 MG tablet, Take 500 mg by mouth 2 (two) times daily with a meal., Disp: , Rfl: ;  metoprolol (LOPRESSOR) 50 MG tablet, Take 75 mg by mouth 2 (two) times daily. , Disp: , Rfl:  Multiple Vitamin (MULTIVITAMIN WITH MINERALS) TABS, Take 1 tablet by mouth daily., Disp: , Rfl: ;  omega-3 acid ethyl esters (LOVAZA) 1 G capsule, Take 1 g by mouth daily., Disp: , Rfl: ;  pantoprazole (PROTONIX) 40 MG tablet, Take 40 mg by mouth every evening., Disp: , Rfl: ;  ramipril (ALTACE) 10 MG capsule, Take 10 mg by mouth daily. , Disp: , Rfl: ;  simvastatin (ZOCOR) 20 MG tablet, Take 20 mg by mouth every evening., Disp: , Rfl:  Tamsulosin HCl (FLOMAX) 0.4 MG CAPS, Take 0.4 mg by mouth daily., Disp: , Rfl: ;  temazepam (RESTORIL) 30 MG capsule, Take 30 mg by mouth at bedtime as needed for sleep., Disp: , Rfl: ;  traMADol (ULTRAM) 50 MG tablet, Take 50 mg by mouth 2 (two) times daily., Disp: , Rfl: ;  vitamin C (ASCORBIC ACID) 500 MG tablet, Take 500  mg by mouth daily., Disp: , Rfl: ;  vitamin E 1000 UNIT capsule, Take 1,000 Units by mouth daily., Disp: , Rfl:   Allergies  Allergen Reactions  . Bee Venom     History  Substance Use Topics  . Smoking status: Former Smoker -- 5 years    Types: Cigarettes    Quit date: 11/21/1961  . Smokeless tobacco: Never Used  . Alcohol Use: Yes     Comment: rare    Family History Hypertension DM type II Heart Disease Cancer   Review of Systems  Constitutional: Negative.   HENT: Negative.  Negative for neck pain.   Eyes: Negative.   Respiratory: Positive for shortness of breath. Negative for cough, hemoptysis, sputum production and wheezing.        SOB with exertion  Cardiovascular: Negative.   Gastrointestinal: Negative.   Genitourinary: Negative.   Musculoskeletal: Positive for joint pain. Negative for myalgias, back pain and falls.       Bilateral knee pain  Skin: Negative.   Neurological: Negative.   Endo/Heme/Allergies: Negative.   Psychiatric/Behavioral: Negative.      Objective:  Physical Exam  Constitutional: He is oriented to person, place, and time. He appears well-developed and well-nourished. No distress.  HENT:  Head: Normocephalic and atraumatic.  Right Ear: External ear normal.  Left Ear: External ear normal.  Nose: Nose normal.  Mouth/Throat: Oropharynx is clear and moist.  Eyes: Conjunctivae and EOM are normal.  Neck: Normal range of motion. Neck supple. No tracheal deviation present. No thyromegaly present.  Cardiovascular: Normal rate, regular rhythm and intact distal pulses.   Murmur heard.  Systolic murmur is present with a grade of 3/6  Respiratory: Effort normal and breath sounds normal. No respiratory distress. He has no wheezes. He exhibits no tenderness.  GI: Soft. Bowel sounds are normal. He exhibits no distension and no mass. There is no tenderness.  Musculoskeletal:       Right hip: Normal.       Left hip: Normal.       Right knee: He exhibits  swelling, LCL laxity, bony tenderness and MCL laxity. He exhibits normal range of motion, no effusion and no erythema. Tenderness found. Medial joint line and lateral joint line tenderness noted.       Left knee: He exhibits swelling and bony tenderness. He exhibits normal range of motion, no effusion, no erythema, no LCL laxity and no MCL laxity.       Right lower leg: He exhibits no tenderness and no swelling.       Left lower leg: He exhibits no tenderness  and no swelling.  Both hips show a normal range of motion. No discomfort. The left knee with no effusion but does exhibit mild soft tissue swelling. Generalized tenderness about the left knee. No varus or valgus laxity. No AP laxity.  His right knee shows no effusion. His range of motion on the right is 5 degrees of hyperextension to 125 degrees. He does have diffuse tenderness. He has gross varus, valgus, anterior and posterior laxity in the knee.   Lymphadenopathy:    He has no cervical adenopathy.  Neurological: He is alert and oriented to person, place, and time. He has normal strength. No sensory deficit.  Skin: No rash noted. He is not diaphoretic. No erythema.  Psychiatric: He has a normal mood and affect. His behavior is normal.    Vital Signs HR: 76 bpm BP: 126/74  Estimated body mass index is 36.37 kg/(m^2) as calculated from the following:   Height as of 11/22/12: 5\' 10"  (1.778 m).   Weight as of 11/21/12: 114.987 kg (253 lb 8 oz).  Imaging Review Plain radiographs demonstrate that the patient has a right total knee arthroplasty with LCS prosthesis.   The overall alignment is neutral.There is no gross evidence of loosening of the femoral, tibial and patellar components but physical exam demonstrates signs that he perhaps never had proper bony ingrowth into the prosthesis.  The bone quality appears to be fair for age and reported activity level.  Assessment/Plan:  End stage arthritis, right knee(s) with failed previous  arthroplasty.   The patient history, physical examination, clinical judgment of the provider and imaging studies are consistent with end stage degenerative joint disease of the right knee(s), previous total knee arthroplasty. Revision total knee arthroplasty is deemed medically necessary. The treatment options including medical management, injection therapy, arthroscopy and revision arthroplasty were discussed at length. The risks and benefits of revision total knee arthroplasty were presented and reviewed. The risks due to aseptic loosening, infection, stiffness, patella tracking problems, thromboembolic complications and other imponderables were discussed. The patient acknowledged the explanation, agreed to proceed with the plan and consent was signed. Patient is being admitted for inpatient treatment for surgery, pain control, PT, OT, prophylactic antibiotics, VTE prophylaxis, progressive ambulation and ADL's and discharge planning.The patient is planning to be discharged home with home health services     [place], [place]

## 2013-04-10 ENCOUNTER — Encounter (HOSPITAL_COMMUNITY): Payer: Self-pay | Admitting: Anesthesiology

## 2013-04-10 ENCOUNTER — Encounter (HOSPITAL_COMMUNITY): Payer: Self-pay | Admitting: *Deleted

## 2013-04-10 ENCOUNTER — Encounter (HOSPITAL_COMMUNITY)
Admission: RE | Disposition: A | Discharge status: Skilled Nursing Facility | Payer: Self-pay | Source: Ambulatory Visit | Attending: Orthopedic Surgery

## 2013-04-10 ENCOUNTER — Inpatient Hospital Stay (HOSPITAL_COMMUNITY)
Admission: RE | Admit: 2013-04-10 | Discharge: 2013-04-15 | DRG: 467 | Disposition: A | Discharge status: Skilled Nursing Facility | Payer: Medicare Other | Source: Ambulatory Visit | Attending: Orthopedic Surgery | Admitting: Orthopedic Surgery

## 2013-04-10 ENCOUNTER — Inpatient Hospital Stay (HOSPITAL_COMMUNITY): Payer: Medicare Other | Admitting: Anesthesiology

## 2013-04-10 DIAGNOSIS — M109 Gout, unspecified: Secondary | ICD-10-CM | POA: Diagnosis present

## 2013-04-10 DIAGNOSIS — Z951 Presence of aortocoronary bypass graft: Secondary | ICD-10-CM

## 2013-04-10 DIAGNOSIS — K219 Gastro-esophageal reflux disease without esophagitis: Secondary | ICD-10-CM | POA: Diagnosis present

## 2013-04-10 DIAGNOSIS — T84018A Broken internal joint prosthesis, other site, initial encounter: Secondary | ICD-10-CM

## 2013-04-10 DIAGNOSIS — I2510 Atherosclerotic heart disease of native coronary artery without angina pectoris: Secondary | ICD-10-CM | POA: Diagnosis present

## 2013-04-10 DIAGNOSIS — I4892 Unspecified atrial flutter: Secondary | ICD-10-CM | POA: Diagnosis present

## 2013-04-10 DIAGNOSIS — Y831 Surgical operation with implant of artificial internal device as the cause of abnormal reaction of the patient, or of later complication, without mention of misadventure at the time of the procedure: Secondary | ICD-10-CM | POA: Diagnosis present

## 2013-04-10 DIAGNOSIS — G4733 Obstructive sleep apnea (adult) (pediatric): Secondary | ICD-10-CM | POA: Diagnosis not present

## 2013-04-10 DIAGNOSIS — I82409 Acute embolism and thrombosis of unspecified deep veins of unspecified lower extremity: Secondary | ICD-10-CM | POA: Diagnosis not present

## 2013-04-10 DIAGNOSIS — Z86718 Personal history of other venous thrombosis and embolism: Secondary | ICD-10-CM

## 2013-04-10 DIAGNOSIS — Z9989 Dependence on other enabling machines and devices: Secondary | ICD-10-CM | POA: Diagnosis not present

## 2013-04-10 DIAGNOSIS — R011 Cardiac murmur, unspecified: Secondary | ICD-10-CM | POA: Diagnosis present

## 2013-04-10 DIAGNOSIS — Z79899 Other long term (current) drug therapy: Secondary | ICD-10-CM

## 2013-04-10 DIAGNOSIS — Z87442 Personal history of urinary calculi: Secondary | ICD-10-CM

## 2013-04-10 DIAGNOSIS — I453 Trifascicular block: Secondary | ICD-10-CM | POA: Diagnosis present

## 2013-04-10 DIAGNOSIS — T84099A Other mechanical complication of unspecified internal joint prosthesis, initial encounter: Principal | ICD-10-CM | POA: Diagnosis present

## 2013-04-10 DIAGNOSIS — I10 Essential (primary) hypertension: Secondary | ICD-10-CM | POA: Diagnosis present

## 2013-04-10 DIAGNOSIS — Z953 Presence of xenogenic heart valve: Secondary | ICD-10-CM

## 2013-04-10 DIAGNOSIS — E785 Hyperlipidemia, unspecified: Secondary | ICD-10-CM | POA: Diagnosis present

## 2013-04-10 DIAGNOSIS — Z96659 Presence of unspecified artificial knee joint: Secondary | ICD-10-CM

## 2013-04-10 DIAGNOSIS — M083 Juvenile rheumatoid polyarthritis (seronegative): Secondary | ICD-10-CM | POA: Diagnosis present

## 2013-04-10 DIAGNOSIS — E119 Type 2 diabetes mellitus without complications: Secondary | ICD-10-CM | POA: Diagnosis present

## 2013-04-10 DIAGNOSIS — Z954 Presence of other heart-valve replacement: Secondary | ICD-10-CM

## 2013-04-10 HISTORY — DX: Unspecified atrial flutter: I48.92

## 2013-04-10 HISTORY — PX: TOTAL KNEE REVISION: SHX996

## 2013-04-10 LAB — GLUCOSE, CAPILLARY: Glucose-Capillary: 165 mg/dL — ABNORMAL HIGH (ref 70–99)

## 2013-04-10 LAB — TYPE AND SCREEN: Antibody Screen: NEGATIVE

## 2013-04-10 SURGERY — TOTAL KNEE REVISION
Anesthesia: General | Site: Knee | Laterality: Right | Wound class: Clean

## 2013-04-10 MED ORDER — ONDANSETRON HCL 4 MG/2ML IJ SOLN: 4.0000 mg | Freq: Four times a day (QID) | INTRAMUSCULAR | Status: DC | PRN

## 2013-04-10 MED ORDER — HYDROMORPHONE HCL PF 1 MG/ML IJ SOLN
INTRAMUSCULAR | Status: DC | PRN
  Administered 2013-04-10 (×4): 0.5 mg via INTRAVENOUS

## 2013-04-10 MED ORDER — ALLOPURINOL 100 MG PO TABS
100.0000 mg | ORAL_TABLET | Freq: Every day | ORAL | Status: DC
  Administered 2013-04-10 – 2013-04-15 (×5): 100 mg via ORAL
  Filled 2013-04-10 (×6): qty 1

## 2013-04-10 MED ORDER — SODIUM CHLORIDE 0.9 % IV SOLN: INTRAVENOUS | Status: DC

## 2013-04-10 MED ORDER — METHOCARBAMOL 500 MG PO TABS
500.0000 mg | ORAL_TABLET | Freq: Four times a day (QID) | ORAL | Status: DC | PRN
  Administered 2013-04-10 – 2013-04-15 (×6): 500 mg via ORAL
  Filled 2013-04-10 (×6): qty 1

## 2013-04-10 MED ORDER — POLYETHYLENE GLYCOL 3350 17 G PO PACK
17.0000 g | PACK | Freq: Every day | ORAL | Status: DC | PRN
  Filled 2013-04-10: qty 1

## 2013-04-10 MED ORDER — METOCLOPRAMIDE HCL 5 MG/ML IJ SOLN: 5.0000 mg | Freq: Three times a day (TID) | INTRAMUSCULAR | Status: DC | PRN

## 2013-04-10 MED ORDER — BUPIVACAINE LIPOSOME 1.3 % IJ SUSP
20.0000 mL | Freq: Once | INTRAMUSCULAR | Status: DC
  Filled 2013-04-10: qty 20

## 2013-04-10 MED ORDER — PANTOPRAZOLE SODIUM 40 MG PO TBEC
40.0000 mg | DELAYED_RELEASE_TABLET | Freq: Every evening | ORAL | Status: DC
  Administered 2013-04-10 – 2013-04-14 (×5): 40 mg via ORAL
  Filled 2013-04-10 (×5): qty 1

## 2013-04-10 MED ORDER — CEFAZOLIN SODIUM 1-5 GM-% IV SOLN
1.0000 g | Freq: Four times a day (QID) | INTRAVENOUS | Status: AC
  Administered 2013-04-10 (×2): 1 g via INTRAVENOUS
  Filled 2013-04-10 (×2): qty 50

## 2013-04-10 MED ORDER — RIVAROXABAN 20 MG PO TABS
20.0000 mg | ORAL_TABLET | Freq: Every day | ORAL | Status: DC
  Administered 2013-04-11 – 2013-04-15 (×5): 20 mg via ORAL
  Filled 2013-04-10 (×6): qty 1

## 2013-04-10 MED ORDER — ACETAMINOPHEN 10 MG/ML IV SOLN
1000.0000 mg | Freq: Once | INTRAVENOUS | Status: DC
  Filled 2013-04-10 (×2): qty 100

## 2013-04-10 MED ORDER — ACETAMINOPHEN 500 MG PO TABS
1000.0000 mg | ORAL_TABLET | Freq: Four times a day (QID) | ORAL | Status: AC
  Administered 2013-04-10 – 2013-04-11 (×4): 1000 mg via ORAL
  Filled 2013-04-10 (×4): qty 2

## 2013-04-10 MED ORDER — 0.9 % SODIUM CHLORIDE (POUR BTL) OPTIME
TOPICAL | Status: DC | PRN
  Administered 2013-04-10: 1000 mL

## 2013-04-10 MED ORDER — PHENYLEPHRINE HCL 10 MG/ML IJ SOLN
INTRAMUSCULAR | Status: DC | PRN
  Administered 2013-04-10 (×4): 40 ug via INTRAVENOUS

## 2013-04-10 MED ORDER — INSULIN ASPART 100 UNIT/ML ~~LOC~~ SOLN
0.0000 [IU] | Freq: Three times a day (TID) | SUBCUTANEOUS | Status: DC
  Administered 2013-04-10: 2 [IU] via SUBCUTANEOUS
  Administered 2013-04-11: 5 [IU] via SUBCUTANEOUS
  Administered 2013-04-11: 3 [IU] via SUBCUTANEOUS
  Administered 2013-04-11 – 2013-04-14 (×6): 2 [IU] via SUBCUTANEOUS
  Administered 2013-04-14: 3 [IU] via SUBCUTANEOUS
  Administered 2013-04-14 – 2013-04-15 (×3): 2 [IU] via SUBCUTANEOUS

## 2013-04-10 MED ORDER — PROMETHAZINE HCL 25 MG/ML IJ SOLN: 6.2500 mg | INTRAMUSCULAR | Status: DC | PRN

## 2013-04-10 MED ORDER — MORPHINE SULFATE 2 MG/ML IJ SOLN
1.0000 mg | INTRAMUSCULAR | Status: DC | PRN
  Administered 2013-04-10 – 2013-04-15 (×20): 2 mg via INTRAVENOUS
  Filled 2013-04-10 (×5): qty 1
  Filled 2013-04-10: qty 2
  Filled 2013-04-10 (×14): qty 1

## 2013-04-10 MED ORDER — METOCLOPRAMIDE HCL 10 MG PO TABS: 5.0000 mg | ORAL_TABLET | Freq: Three times a day (TID) | ORAL | Status: DC | PRN

## 2013-04-10 MED ORDER — FLEET ENEMA 7-19 GM/118ML RE ENEM: 1.0000 | ENEMA | Freq: Once | RECTAL | Status: AC | PRN

## 2013-04-10 MED ORDER — DEXAMETHASONE 6 MG PO TABS
10.0000 mg | ORAL_TABLET | Freq: Every day | ORAL | Status: AC
  Administered 2013-04-11: 10 mg via ORAL
  Filled 2013-04-10: qty 1

## 2013-04-10 MED ORDER — BISACODYL 10 MG RE SUPP: 10.0000 mg | Freq: Every day | RECTAL | Status: DC | PRN

## 2013-04-10 MED ORDER — MEPERIDINE HCL 50 MG/ML IJ SOLN: 6.2500 mg | INTRAMUSCULAR | Status: DC | PRN

## 2013-04-10 MED ORDER — SIMVASTATIN 20 MG PO TABS
20.0000 mg | ORAL_TABLET | Freq: Every evening | ORAL | Status: DC
  Administered 2013-04-10 – 2013-04-14 (×5): 20 mg via ORAL
  Filled 2013-04-10 (×6): qty 1

## 2013-04-10 MED ORDER — KETAMINE HCL 50 MG/ML IJ SOLN
INTRAMUSCULAR | Status: DC | PRN
  Administered 2013-04-10: 10 mg via INTRAMUSCULAR
  Administered 2013-04-10: 5 mg via INTRAMUSCULAR
  Administered 2013-04-10: 10 mg via INTRAMUSCULAR

## 2013-04-10 MED ORDER — HYDROMORPHONE HCL PF 1 MG/ML IJ SOLN
0.2500 mg | INTRAMUSCULAR | Status: DC | PRN
Start: 2013-04-10 — End: 2013-04-10
  Administered 2013-04-10 (×2): 0.5 mg via INTRAVENOUS

## 2013-04-10 MED ORDER — GLYCOPYRROLATE 0.2 MG/ML IJ SOLN
INTRAMUSCULAR | Status: DC | PRN
  Administered 2013-04-10: .7 mg via INTRAVENOUS

## 2013-04-10 MED ORDER — OXYCODONE HCL 5 MG/5ML PO SOLN
5.0000 mg | Freq: Once | ORAL | Status: DC | PRN
  Filled 2013-04-10: qty 5

## 2013-04-10 MED ORDER — FLUOXETINE HCL 20 MG PO CAPS
20.0000 mg | ORAL_CAPSULE | Freq: Every evening | ORAL | Status: DC
  Administered 2013-04-10 – 2013-04-14 (×5): 20 mg via ORAL
  Filled 2013-04-10 (×6): qty 1

## 2013-04-10 MED ORDER — PHENOL 1.4 % MT LIQD: 1.0000 | OROMUCOSAL | Status: DC | PRN

## 2013-04-10 MED ORDER — TRAMADOL HCL 50 MG PO TABS: 50.0000 mg | ORAL_TABLET | Freq: Four times a day (QID) | ORAL | Status: DC | PRN

## 2013-04-10 MED ORDER — OXYCODONE HCL 5 MG PO TABS: 5.0000 mg | ORAL_TABLET | Freq: Once | ORAL | Status: DC | PRN

## 2013-04-10 MED ORDER — RIVAROXABAN 10 MG PO TABS
10.0000 mg | ORAL_TABLET | Freq: Every day | ORAL | Status: DC
  Filled 2013-04-10: qty 1

## 2013-04-10 MED ORDER — SODIUM CHLORIDE 0.9 % IJ SOLN
INTRAMUSCULAR | Status: DC | PRN
  Administered 2013-04-10: 13:00:00

## 2013-04-10 MED ORDER — ADULT MULTIVITAMIN W/MINERALS CH
1.0000 | ORAL_TABLET | Freq: Every day | ORAL | Status: DC
  Administered 2013-04-10 – 2013-04-15 (×5): 1 via ORAL
  Filled 2013-04-10 (×6): qty 1

## 2013-04-10 MED ORDER — RAMIPRIL 10 MG PO CAPS
10.0000 mg | ORAL_CAPSULE | Freq: Every day | ORAL | Status: DC
  Administered 2013-04-10 – 2013-04-15 (×6): 10 mg via ORAL
  Filled 2013-04-10 (×6): qty 1

## 2013-04-10 MED ORDER — DEXAMETHASONE SODIUM PHOSPHATE 10 MG/ML IJ SOLN
10.0000 mg | Freq: Every day | INTRAMUSCULAR | Status: AC
  Filled 2013-04-10: qty 1

## 2013-04-10 MED ORDER — ACETAMINOPHEN 500 MG PO TABS
1000.0000 mg | ORAL_TABLET | Freq: Once | ORAL | Status: AC
  Administered 2013-04-10: 1000 mg via ORAL

## 2013-04-10 MED ORDER — CEFAZOLIN SODIUM-DEXTROSE 2-3 GM-% IV SOLR
2.0000 g | INTRAVENOUS | Status: AC
  Administered 2013-04-10: 2 g via INTRAVENOUS

## 2013-04-10 MED ORDER — METFORMIN HCL 500 MG PO TABS
500.0000 mg | ORAL_TABLET | Freq: Two times a day (BID) | ORAL | Status: DC
  Administered 2013-04-10: 500 mg via ORAL
  Filled 2013-04-10 (×4): qty 1

## 2013-04-10 MED ORDER — METOPROLOL TARTRATE 50 MG PO TABS
75.0000 mg | ORAL_TABLET | Freq: Two times a day (BID) | ORAL | Status: DC
  Administered 2013-04-10 – 2013-04-11 (×2): 75 mg via ORAL
  Filled 2013-04-10 (×3): qty 1

## 2013-04-10 MED ORDER — TEMAZEPAM 15 MG PO CAPS
30.0000 mg | ORAL_CAPSULE | Freq: Every evening | ORAL | Status: DC | PRN
  Administered 2013-04-10 – 2013-04-14 (×5): 30 mg via ORAL
  Filled 2013-04-10: qty 2
  Filled 2013-04-10 (×2): qty 1
  Filled 2013-04-10 (×3): qty 2

## 2013-04-10 MED ORDER — POTASSIUM CHLORIDE IN NACL 20-0.9 MEQ/L-% IV SOLN
INTRAVENOUS | Status: DC
  Administered 2013-04-10 – 2013-04-14 (×5): via INTRAVENOUS
  Filled 2013-04-10 (×8): qty 1000

## 2013-04-10 MED ORDER — FENTANYL CITRATE 0.05 MG/ML IJ SOLN
INTRAMUSCULAR | Status: DC | PRN
  Administered 2013-04-10 (×5): 50 ug via INTRAVENOUS

## 2013-04-10 MED ORDER — CELECOXIB 200 MG PO CAPS
200.0000 mg | ORAL_CAPSULE | Freq: Once | ORAL | Status: AC
  Administered 2013-04-10: 200 mg via ORAL
  Filled 2013-04-10: qty 1

## 2013-04-10 MED ORDER — ROCURONIUM BROMIDE 100 MG/10ML IV SOLN
INTRAVENOUS | Status: DC | PRN
  Administered 2013-04-10: 40 mg via INTRAVENOUS

## 2013-04-10 MED ORDER — NEOSTIGMINE METHYLSULFATE 1 MG/ML IJ SOLN
INTRAMUSCULAR | Status: DC | PRN
  Administered 2013-04-10: 4 mg via INTRAVENOUS

## 2013-04-10 MED ORDER — MENTHOL 3 MG MT LOZG: 1.0000 | LOZENGE | OROMUCOSAL | Status: DC | PRN

## 2013-04-10 MED ORDER — PROPOFOL 10 MG/ML IV BOLUS
INTRAVENOUS | Status: DC | PRN
  Administered 2013-04-10: 120 mg via INTRAVENOUS

## 2013-04-10 MED ORDER — BUPIVACAINE HCL 0.25 % IJ SOLN
INTRAMUSCULAR | Status: DC | PRN
  Administered 2013-04-10: 20 mL

## 2013-04-10 MED ORDER — LACTATED RINGERS IV SOLN
INTRAVENOUS | Status: DC
  Administered 2013-04-10: 13:00:00 via INTRAVENOUS
  Administered 2013-04-10: 1000 mL via INTRAVENOUS

## 2013-04-10 MED ORDER — ACETAMINOPHEN 10 MG/ML IV SOLN
1000.0000 mg | Freq: Once | INTRAVENOUS | Status: DC | PRN
  Filled 2013-04-10: qty 100

## 2013-04-10 MED ORDER — ONDANSETRON HCL 4 MG PO TABS: 4.0000 mg | ORAL_TABLET | Freq: Four times a day (QID) | ORAL | Status: DC | PRN

## 2013-04-10 MED ORDER — DIPHENHYDRAMINE HCL 12.5 MG/5ML PO ELIX: 12.5000 mg | ORAL_SOLUTION | ORAL | Status: DC | PRN

## 2013-04-10 MED ORDER — SODIUM CHLORIDE 0.9 % IR SOLN
Status: DC | PRN
  Administered 2013-04-10: 1000 mL

## 2013-04-10 MED ORDER — TAMSULOSIN HCL 0.4 MG PO CAPS
0.4000 mg | ORAL_CAPSULE | Freq: Every day | ORAL | Status: DC
  Administered 2013-04-10 – 2013-04-15 (×5): 0.4 mg via ORAL
  Filled 2013-04-10 (×6): qty 1

## 2013-04-10 MED ORDER — DOCUSATE SODIUM 100 MG PO CAPS
100.0000 mg | ORAL_CAPSULE | Freq: Two times a day (BID) | ORAL | Status: DC
  Administered 2013-04-10 – 2013-04-15 (×9): 100 mg via ORAL
  Filled 2013-04-10 (×11): qty 1

## 2013-04-10 MED ORDER — METHOCARBAMOL 100 MG/ML IJ SOLN
500.0000 mg | Freq: Four times a day (QID) | INTRAVENOUS | Status: DC | PRN
  Administered 2013-04-10: 500 mg via INTRAVENOUS
  Filled 2013-04-10: qty 5

## 2013-04-10 MED ORDER — OXYCODONE HCL 5 MG PO TABS
5.0000 mg | ORAL_TABLET | ORAL | Status: DC | PRN
  Administered 2013-04-11 – 2013-04-14 (×5): 10 mg via ORAL
  Filled 2013-04-10 (×5): qty 2

## 2013-04-10 MED ORDER — ACETAMINOPHEN 650 MG RE SUPP
650.0000 mg | Freq: Four times a day (QID) | RECTAL | Status: AC
  Filled 2013-04-10 (×2): qty 1

## 2013-04-10 MED ORDER — DONEPEZIL HCL 10 MG PO TABS
10.0000 mg | ORAL_TABLET | Freq: Every evening | ORAL | Status: DC
  Administered 2013-04-10 – 2013-04-14 (×5): 10 mg via ORAL
  Filled 2013-04-10 (×6): qty 1

## 2013-04-10 SURGICAL SUPPLY — 75 items
ADAPTER BOLT FEMORAL +2/-2 (Knees) ×1 IMPLANT
ADPR FEM +2/-2 OFST BOLT (Knees) ×1 IMPLANT
ADPR FEM 5D STRL KN PFC SGM (Orthopedic Implant) ×1 IMPLANT
AUG FEM 4 4 CMB LF KN POST (Knees) ×2 IMPLANT
AUG FEM 4 4 STRL LF KN RT (Knees) ×2 IMPLANT
AUG TIB SZ4 5 REV STP WDG STRL (Knees) ×2 IMPLANT
AUGMENT POSTEERIOR PFC SZ4 RT (Knees) IMPLANT
AUGMENT POSTERIOR PFC SZ4 4MM (Knees) IMPLANT
BAG SPEC THK2 15X12 ZIP CLS (MISCELLANEOUS) ×1
BAG ZIPLOCK 12X15 (MISCELLANEOUS) ×2 IMPLANT
BANDAGE ELASTIC 6 VELCRO ST LF (GAUZE/BANDAGES/DRESSINGS) ×2 IMPLANT
BANDAGE ESMARK 6X9 LF (GAUZE/BANDAGES/DRESSINGS) ×1 IMPLANT
BLADE SAG 18X100X1.27 (BLADE) ×2 IMPLANT
BLADE SAW SGTL 11.0X1.19X90.0M (BLADE) ×2 IMPLANT
BNDG CMPR 9X6 STRL LF SNTH (GAUZE/BANDAGES/DRESSINGS) ×1
BNDG ESMARK 6X9 LF (GAUZE/BANDAGES/DRESSINGS) ×2
BONE CEMENT GENTAMICIN (Cement) ×6 IMPLANT
CATH FOLEY 2WAY SLVR  5CC 16FR (CATHETERS) ×1
CATH FOLEY 2WAY SLVR 5CC 16FR (CATHETERS) IMPLANT
CEMENT BONE GENTAMICIN 40 (Cement) IMPLANT
CEMENT RESTRICTOR DEPUY SZ 4 (Cement) ×1 IMPLANT
CLOTH BEACON ORANGE TIMEOUT ST (SAFETY) ×2 IMPLANT
COMP FEM CEM RT SZ4 (Orthopedic Implant) ×2 IMPLANT
COMPONENT FEM CEM RT SZ4 (Orthopedic Implant) IMPLANT
CONT SPECI 4OZ STER CLIK (MISCELLANEOUS) ×1 IMPLANT
CUFF TOURN SGL QUICK 34 (TOURNIQUET CUFF) ×2
CUFF TRNQT CYL 34X4X40X1 (TOURNIQUET CUFF) ×1 IMPLANT
DRAPE EXTREMITY T 121X128X90 (DRAPE) ×2 IMPLANT
DRAPE POUCH INSTRU U-SHP 10X18 (DRAPES) ×2 IMPLANT
DRAPE U-SHAPE 47X51 STRL (DRAPES) ×2 IMPLANT
DRSG ADAPTIC 3X8 NADH LF (GAUZE/BANDAGES/DRESSINGS) ×2 IMPLANT
DRSG PAD ABDOMINAL 8X10 ST (GAUZE/BANDAGES/DRESSINGS) ×1 IMPLANT
DURAPREP 26ML APPLICATOR (WOUND CARE) ×2 IMPLANT
ELECT REM PT RETURN 9FT ADLT (ELECTROSURGICAL) ×2
ELECTRODE REM PT RTRN 9FT ADLT (ELECTROSURGICAL) ×1 IMPLANT
EVACUATOR 1/8 PVC DRAIN (DRAIN) ×2 IMPLANT
FACESHIELD LNG OPTICON STERILE (SAFETY) ×10 IMPLANT
FEMORAL ADAPTER (Orthopedic Implant) ×1 IMPLANT
GLOVE BIO SURGEON STRL SZ7.5 (GLOVE) ×2 IMPLANT
GLOVE BIO SURGEON STRL SZ8 (GLOVE) ×2 IMPLANT
GLOVE BIOGEL PI IND STRL 8 (GLOVE) ×2 IMPLANT
GLOVE BIOGEL PI INDICATOR 8 (GLOVE) ×2
GOWN STRL NON-REIN LRG LVL3 (GOWN DISPOSABLE) ×4 IMPLANT
GOWN STRL REIN XL XLG (GOWN DISPOSABLE) ×3 IMPLANT
HANDPIECE INTERPULSE COAX TIP (DISPOSABLE) ×2
IMMOBILIZER KNEE 20 (SOFTGOODS) ×2
IMMOBILIZER KNEE 20 THIGH 36 (SOFTGOODS) ×1 IMPLANT
INSERT TIBIAL RP TCS SZ 4.0 (Knees) ×1 IMPLANT
KIT BASIN OR (CUSTOM PROCEDURE TRAY) ×2 IMPLANT
MANIFOLD NEPTUNE II (INSTRUMENTS) ×2 IMPLANT
NS IRRIG 1000ML POUR BTL (IV SOLUTION) ×2 IMPLANT
PACK TOTAL JOINT (CUSTOM PROCEDURE TRAY) ×2 IMPLANT
PADDING CAST COTTON 6X4 STRL (CAST SUPPLIES) ×6 IMPLANT
PATELLA DOME PFC 38MM (Knees) ×1 IMPLANT
POSITIONER SURGICAL ARM (MISCELLANEOUS) ×2 IMPLANT
POSTERIOR AUGMENT PFC SZ4 4MM (Knees) ×4 IMPLANT
POSTERIOR AUGMENT PFC SZ4 RT (Knees) ×4 IMPLANT
SET HNDPC FAN SPRY TIP SCT (DISPOSABLE) ×1 IMPLANT
SPONGE GAUZE 4X4 12PLY (GAUZE/BANDAGES/DRESSINGS) ×2 IMPLANT
STAPLER VISISTAT 35W (STAPLE) ×2 IMPLANT
STEM FLUTED UNIV REV 75X20 (Stem) ×1 IMPLANT
STEM TIBIA PFC 13X30MM (Stem) ×1 IMPLANT
SUCTION FRAZIER 12FR DISP (SUCTIONS) ×2 IMPLANT
SUT VIC AB 2-0 CT1 27 (SUTURE) ×6
SUT VIC AB 2-0 CT1 TAPERPNT 27 (SUTURE) ×3 IMPLANT
SWAB COLLECTION DEVICE MRSA (MISCELLANEOUS) ×1 IMPLANT
TOWEL OR 17X26 10 PK STRL BLUE (TOWEL DISPOSABLE) ×4 IMPLANT
TOWER CARTRIDGE SMART MIX (DISPOSABLE) ×2 IMPLANT
TRAY FOLEY CATH 14FRSI W/METER (CATHETERS) ×2 IMPLANT
TRAY REVISION SZ 4 (Knees) ×1 IMPLANT
TRAY SLEEVE CEM ML (Knees) ×1 IMPLANT
TUBE ANAEROBIC SPECIMEN COL (MISCELLANEOUS) ×1 IMPLANT
WATER STERILE IRR 1500ML POUR (IV SOLUTION) ×3 IMPLANT
WEDGE SIZE 4 5MM (Knees) ×2 IMPLANT
WRAP KNEE MAXI GEL POST OP (GAUZE/BANDAGES/DRESSINGS) ×3 IMPLANT

## 2013-04-10 NOTE — Brief Op Note (Signed)
04/10/2013  1:08 PM  PATIENT:  [name] [name]  78 y.o. male  PRE-OPERATIVE DIAGNOSIS:  UNSTABLE RIGHT TOTAL KNEE ARTHROPLASTY  POST-OPERATIVE DIAGNOSIS:  UNSTABLE RIGHT TOTAL KNEE ARTHROPLASTY  PROCEDURE:  Procedure(s): RIGHT TOTAL KNEE REVISION (Right)  SURGEON:  Surgeon(s) and Role:    * Loanne Drilling, MD - Primary  PHYSICIAN ASSISTANT:   ASSISTANTS: Avel Peace, PA-C   ANESTHESIA:   spinal  EBL:  Total I/O In: 1000 [I.V.:1000] Out: -   BLOOD ADMINISTERED:none  DRAINS: (Medium) Hemovact drain(s) in the right knee with  Suction Open   LOCAL MEDICATIONS USED:  OTHER Exparel  SPECIMEN:  No Specimen  DISPOSITION OF SPECIMEN:  N/A  COUNTS:  YES  TOURNIQUET:   Total Tourniquet Time Documented: Thigh (Right) - 57 minutes Total: Thigh (Right) - 57 minutes   DICTATION: .Other Dictation: Dictation Number [id number]  PLAN OF CARE: Admit to inpatient   PATIENT DISPOSITION:  PACU - hemodynamically stable.

## 2013-04-10 NOTE — Consult Note (Addendum)
CONSULTATION NOTE  Reason for Consult: Post-operative atrial flutter  Requesting Physician: Dr. Despina Hick  Cardiologist: Dr. Alanda Amass  HPI: This is a 78 y.o. male is a 78 year old, white, married father of 4 with 3 grandchildren who quit smoking over 50 years ago. 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 Lequita Halt. He has also had prior lumbosacral disk surgery by Dr. Channing Mutters and he has had recent epidural injections by Dr. Eduard Clos on L3-L4, L4-L5 and L5-S1, steroid injections. He has improved somewhat with that but may require re-do back surgery. He has seen Dr. Ledell Peoples partner a month ago and is felt to be stable. From a cardiac standpoint, he has known coronary disease with remote CABG in 2000. He then required re-do CABG because of severe aortic stenosis. He had patent bypass grafts and had pericardial AVR by Dr. Donata Clay in December 2009. He has done quite well since that time. He has associated history of hypertension, obstructive sleep apnea on CPAP, chronic right bundle-branch block with left axis deviation and first-degree heart block - trifascicular block but this has been asymptomatic and he has not required permanent pacing. He has had left hip arthrodesis, past bilateral knee replacements. He has associated AODM and remote DVT after back surgery 2 years ago.  He recently came out of surgery (which was general anesthesia). After his surgery he was noted to go into typical atrial flutter with 4:1 AV block. We are asked to consult regarding management of his atrial flutter.  PMHx:  Past Medical History  Diagnosis Date  . Coronary artery disease   . Hypertension   . Hyperlipidemia   . Heart murmur   . Diabetes mellitus without complication   . GERD (gastroesophageal reflux disease)   . History of kidney stones   .  History of gout   . Sleep apnea     USES C-PAP  . Cyst of eye     X3 CYSTS in left eyeHX OF LOSS OF VISION - RESOLVED AT PRESENT  . Pneumonia     hx of  . Arthritis   . Still's disease 1997    in remission  . Complication of anesthesia     can not pee after surgery   Past Surgical History  Procedure Laterality Date  . Joint replacement  1996/2005    bil  . Lung mass  1965    benign mass removed  . Colon resection      benign mass  . Hiatal hernia repair  2000  . Aortic valve replacement  2009  . Back surgery  2011  . Coronary artery bypass graft  2000    3 VESSELLS  . Lithotripsy    . Tonsillectomy  at 78 years old  . Hammer toe surgery Right     x2  . Bunionectomy Left   . Knee arthroscopy Right     FAMHx: Mother had CAD. Father died of cancer. Grandmother with CAD.  SOCHx:  reports that he quit smoking about 51 years ago. His smoking use included Cigarettes. He smoked 0.00 packs per day for 5 years. He has never used smokeless tobacco. He reports that  drinks alcohol. He reports that he does not use illicit drugs.  ALLERGIES: Allergies  Allergen Reactions  . Bee Venom     ROS: A comprehensive review of systems was negative except  for: Musculoskeletal: positive for stiff joints  HOME MEDICATIONS: Prescriptions prior to admission  Medication Sig Dispense Refill  . allopurinol (ZYLOPRIM) 100 MG tablet Take 100 mg by mouth daily.       . B Complex-C (B-COMPLEX WITH VITAMIN C) tablet Take 1 tablet by mouth daily.      Marland Kitchen donepezil (ARICEPT) 10 MG tablet Take 10 mg by mouth daily before breakfast.      . FLUoxetine (PROZAC) 20 MG capsule Take 20 mg by mouth every evening.      . metFORMIN (GLUCOPHAGE) 500 MG tablet Take 500 mg by mouth 2 (two) times daily with a meal.      . metoprolol (LOPRESSOR) 50 MG tablet Take 75 mg by mouth 2 (two) times daily.       Marland Kitchen omega-3 acid ethyl esters (LOVAZA) 1 G capsule Take 1 g by mouth daily.      . pantoprazole (PROTONIX) 40  MG tablet Take 40 mg by mouth every evening.      . ramipril (ALTACE) 10 MG capsule Take 10 mg by mouth daily.       . simvastatin (ZOCOR) 20 MG tablet Take 20 mg by mouth every evening.      . temazepam (RESTORIL) 30 MG capsule Take 30 mg by mouth at bedtime as needed for sleep.      . traMADol (ULTRAM) 50 MG tablet Take 50 mg by mouth 2 (two) times daily.      . vitamin C (ASCORBIC ACID) 500 MG tablet Take 500 mg by mouth daily.      . vitamin E 1000 UNIT capsule Take 1,000 Units by mouth daily.      Marland Kitchen aspirin EC 81 MG tablet Take 162 mg by mouth every evening.      . clopidogrel (PLAVIX) 75 MG tablet Take 75 mg by mouth daily before breakfast.      . Multiple Vitamin (MULTIVITAMIN WITH MINERALS) TABS Take 1 tablet by mouth daily.      . Tamsulosin HCl (FLOMAX) 0.4 MG CAPS Take 0.4 mg by mouth daily.        HOSPITAL MEDICATIONS: Prior to Admission:  Prescriptions prior to admission  Medication Sig Dispense Refill  . allopurinol (ZYLOPRIM) 100 MG tablet Take 100 mg by mouth daily.       . B Complex-C (B-COMPLEX WITH VITAMIN C) tablet Take 1 tablet by mouth daily.      Marland Kitchen donepezil (ARICEPT) 10 MG tablet Take 10 mg by mouth daily before breakfast.      . FLUoxetine (PROZAC) 20 MG capsule Take 20 mg by mouth every evening.      . metFORMIN (GLUCOPHAGE) 500 MG tablet Take 500 mg by mouth 2 (two) times daily with a meal.      . metoprolol (LOPRESSOR) 50 MG tablet Take 75 mg by mouth 2 (two) times daily.       Marland Kitchen omega-3 acid ethyl esters (LOVAZA) 1 G capsule Take 1 g by mouth daily.      . pantoprazole (PROTONIX) 40 MG tablet Take 40 mg by mouth every evening.      . ramipril (ALTACE) 10 MG capsule Take 10 mg by mouth daily.       . simvastatin (ZOCOR) 20 MG tablet Take 20 mg by mouth every evening.      . temazepam (RESTORIL) 30 MG capsule Take 30 mg by mouth at bedtime as needed for sleep.      . traMADol (ULTRAM) 50 MG  tablet Take 50 mg by mouth 2 (two) times daily.      . vitamin C  (ASCORBIC ACID) 500 MG tablet Take 500 mg by mouth daily.      . vitamin E 1000 UNIT capsule Take 1,000 Units by mouth daily.      Marland Kitchen aspirin EC 81 MG tablet Take 162 mg by mouth every evening.      . clopidogrel (PLAVIX) 75 MG tablet Take 75 mg by mouth daily before breakfast.      . Multiple Vitamin (MULTIVITAMIN WITH MINERALS) TABS Take 1 tablet by mouth daily.      . Tamsulosin HCl (FLOMAX) 0.4 MG CAPS Take 0.4 mg by mouth daily.        VITALS: Blood pressure 104/64, pulse 75, temperature 97.5 F (36.4 C), temperature source Oral, resp. rate 16, height 5' 10.5" (1.791 m), weight 244 lb (110.678 kg), SpO2 99.00%.  PHYSICAL EXAM: General appearance: alert and no distress Neck: no adenopathy, no carotid bruit, no JVD, supple, symmetrical, trachea midline and thyroid not enlarged, symmetric, no tenderness/mass/nodules Lungs: clear to auscultation bilaterally Heart: regular rate and rhythm, S1, S2 normal and systolic murmur: early systolic 2/6, crescendo at 2nd right intercostal space Abdomen: soft, non-tender; bowel sounds normal; no masses,  no organomegaly Extremities: extremities normal, atraumatic, no cyanosis or edema and right leg is immobilized and in traction Pulses: 2+ and symmetric Skin: Skin color, texture, turgor normal. No rashes or lesions Neurologic: Grossly normal  LABS: Results for orders placed during the hospital encounter of 04/10/13 (from the past 48 hour(s))  GLUCOSE, CAPILLARY     Status: Abnormal   Collection Time    04/10/13  8:57 AM      Result Value Range   Glucose-Capillary 155 (*) 70 - 99 mg/dL  GLUCOSE, CAPILLARY     Status: Abnormal   Collection Time    04/10/13  1:26 PM      Result Value Range   Glucose-Capillary 156 (*) 70 - 99 mg/dL   Comment 1 Documented in Chart    GLUCOSE, CAPILLARY     Status: Abnormal   Collection Time    04/10/13  5:06 PM      Result Value Range   Glucose-Capillary 127 (*) 70 - 99 mg/dL    IMAGING: No results  found.  IMPRESSION: 1. Typical atrial flutter with 4:1 conduction - asymptomatic 2. H/O CABG - no chest pain 3. AVR - bioprosthetic 4. Trifascicular block 5. OSA - on CPAP  RECOMMENDATION: 1. [name] has typical atrial flutter with 4:1 conduction and is asymptomatic. He has a known underlying trifascicular block and is on a higher dose of metoprolol, so rate control is not an issue.  I suspect that the flutter may have been a consequence of anesthesia. This could resolve on its own, as flutter is an inherently unstable rhythm. If not, he will need elective cardioversion.  I would recommend increasing Xarelto to an antithrombotic dose for a-fib/flutter.  If there is no spontaneous conversion tomorrow, we could consider elective cardioversion on Friday. Alternatively, it could be performed at any time as an outpatient (he will not need TEE, since anticoagulation was started within 48 hours of onset).  I will discuss the case with Dr. Alanda Amass.  Thanks for consulting [place].  Time Spent Directly with Patient: 30 minutes  Chrystie Nose, MD, [facility] Attending Cardiologist [facility]  HILTY,Kenneth C 04/10/2013, 5:36 PM

## 2013-04-10 NOTE — Op Note (Signed)
NAME:  [name], [name] NO.:  [id number]  MEDICAL RECORD NO.:  [id number]  LOCATION:  1440                         FACILITY:  [facility]  PHYSICIAN:  Ollen Gross, M.D.    DATE OF BIRTH:  August 20, 1935  DATE OF PROCEDURE:  04/10/2013 DATE OF DISCHARGE:                              OPERATIVE REPORT   PREOPERATIVE DIAGNOSIS:  Unstable right total knee arthroplasty.  POSTOPERATIVE DIAGNOSIS:  Unstable right total knee arthroplasty.  PROCEDURE:  Right total knee arthroplasty revision.  SURGEON:  Ollen Gross, MD  ASSISTANT:  Alexzandrew L. Perkins, PA-C  ANESTHESIA:  Spinal.  ESTIMATED BLOOD LOSS:  Minimal.  DRAINS:  Hemovac x1.  TOURNIQUET TIME:  57 minutes at 300 mmHg.  COMPLICATIONS:  None.  CONDITION:  Stable to recovery.  BRIEF CLINICAL NOTE:  [name] is a 78 year old male, who has a painful unstable right total knee arthroplasty.  He has had progressive worsening pain and instability to the point where it has given out on him.  He presents now for total knee arthroplasty revision.  PROCEDURE IN DETAIL:  After successful administration of spinal anesthetic, a tourniquet was placed high on his right thigh.  Right lower extremity was prepped and draped in usual sterile fashion. Extremity was wrapped in Esmarch and tourniquet inflated to 300 mmHg.  A midline incision was made with 10 blade through subcutaneous tissue to the extensor mechanism.  Fresh blade was used to make a medial parapatellar arthrotomy.  Did not encounter any fluid in the joint.  He did have gross instability.  I was able to dislocate the tibia anteriorly and remove tibial polyethylene.  Prior to that, I did the soft tissue releases off the tibia.  The tibial retractors were then placed and oscillating saw used to disrupt the interface between the tibial component and bone.  With minimal disruption, the tibial component was easily removed.  There was a tiny amount of cement on  the cut bone surface and no cement down in the keel.  We then drilled into the tibia and thoroughly irrigated the canal.  Then reamed up to 13 mm with 13 mm stem.  The extramedullary tibial cutting guide was then placed referencing proximally at the medial aspect of the tibial tubercle and distally along the second metatarsal axis and tibial crest. The block was pinned to remove about 2 mm.  He had an excessive amount of posterior slope from the previous surgery and we went back to only about 2 degrees of posterior slope with the current resection.  Size 4 was the most appropriate tibial component.  The tibia was prepared proximally with the modular drill and the keel punch.  I then prepared the tibia for a 29 mm sleeve for rotational control.  The interface between the femoral component and bone was disrupted with osteotomes and the femoral component removed with minimal of any bone loss.  I reamed the femoral canal and then thoroughly irrigated with saline.  Reaming was then performed with a 20 mm which had a good press- fit.  The 20 mm reamer was left in place to serve as an intramedullary cutting guide.  Distal femoral cutting block was placed.  I had  to go up to 4 mm to get any bone dust to utilize 4 mm augments medial and lateral.  The size 4 was the most appropriate femoral component.  Size 4 cutting block was placed in a +2 position effectively raising the stem and lowering the flange of the femur down to the trochlea.  The rotation was marked at the epicondylar axis and confirmed by creating rectangular flexion gap at 90 degrees.  A block was pinned in this position and anterior and posterior chamfer cuts were made.  We also needed 4 mm posterior augments medial and lateral to get down to normal bone. Intercondylar block was placed and that cut was made.  The trial was then placed on the tibial side with a size 4 MBT revision tray with 5 mm medial and lateral augments and a 29  mm sleeve.  On the femoral side, the size 4 TC3 femur with the 4 mm medial and lateral, distal and posterior augments.  The stem was 75 x 20 in +2 position and 5 degrees of valgus.  The trials were placed.  With a 12.5 insert, full extension was achieved with tiny bit of varus-valgus play.  A 15 insert full extension was achieved with excellent varus-valgus and anterior- posterior stability throughout full range of motion.  The patella was again everted.  The patellar component was removed from the patella.  I had to take it because of the mobile bearing patella which would not meet with the TC3 femur.  Residual thickness is about 12 mm.  We drilled the lug holes for the 38 template and then placed a trial patella which tracked normally.  Trials were removed and we sized the cement restrictor on tibial side, which was ended up being a size 4.  Size 4 restrictor was placed to appropriate depth in the tibial canal.  Wound was copiously irrigated with saline solution with pulsatile lavage.  The components were assembled on the back table and once ready, the cement was mixed.  Once the cement was ready for implantation, then the size 4 MBT tibia revision tray with the 5 mm medial and lateral augments with 29 sleeve was cemented into place.  Femoral components cemented distally with a press-fit and stem.  Tibial component was cemented at the stem and on the bone surface.  A 12.5 mm insert trial was placed.  A 38 patella was placed and held with the patellar clamp.  The cement was fully hardened.  We placed through range of motion and we got better stability with the 15 and 12.5.  Thus a 15 mm TC3 rotating platform insert was placed.  This was fantastic stability throughout full range of motion.  The wound was further irrigated with saline solution.  The arthrotomy was then closed over Hemovac drain with running #1 V-Loc suture.  The flexion against gravity is about 130 degrees.  Patella tracks  normally.  Tourniquet released.  Total time of 57 minutes.  Subcu was closed with interrupted 2-0 Vicryl and the skin closed with staples. The drains hooked to suction.  Incision cleaned and dried and a bulky sterile dressing applied.  He was then awakened and transported to recovery in stable condition.  Please note that a surgical assistant was a medical necessity for this procedure to perform it in a safe and expeditious manner.  Surgical assistant was necessary for retraction of vital neurovascular structures and to allow for proper exposure to protect the ligaments and bony structures while removing and  placing the new prosthetic components.     Ollen Gross, M.D.     FA/MEDQ  D:  04/10/2013  T:  04/10/2013  Job:  [id number]

## 2013-04-10 NOTE — Interval H&P Note (Signed)
History and Physical Interval Note:  04/10/2013 11:02 AM  [name] [name]  has presented today for surgery, with the diagnosis of UNSTABLE RIGHT TOTAL KNEE ARTHROPLASTY  The various methods of treatment have been discussed with the patient and family. After consideration of risks, benefits and other options for treatment, the patient has consented to  Procedure(s): RIGHT TOTAL KNEE REVISION (Right) as a surgical intervention .  The patient's history has been reviewed, patient examined, no change in status, stable for surgery.  I have reviewed the patient's chart and labs.  Questions were answered to the patient's satisfaction.     Loanne Drilling

## 2013-04-10 NOTE — Preoperative (Signed)
Beta Blockers   Reason not to administer Beta Blockers:Not Applicable, took BB this am 

## 2013-04-10 NOTE — Progress Notes (Signed)
Dr. Renold Don in- saw EKG COPY- PATIENT TO GO TO TELEMETRY

## 2013-04-10 NOTE — Anesthesia Postprocedure Evaluation (Signed)
Anesthesia Post Note  Patient: [name] [name]  Procedure(s) Performed: Procedure(s) (LRB): RIGHT TOTAL KNEE REVISION (Right)  Anesthesia type: General  Patient location: PACU  Post pain: Pain level controlled  Post assessment: Post-op Vital signs reviewed  Last Vitals: BP 133/84  Pulse 62  Temp(Src) 36.9 C (Oral)  Resp 12  SpO2 100%  Post vital signs: VSS  Level of consciousness: sedated  OR/PACU course: Pt with sinus rhythm on preop EKG. A-flutter noted post induction with 3:1 conduction at rate of ~65. Case proceeded without issue. Discussed with Dr. Lequita Halt and recommended cardiology consult and tele bed.  Complications: No apparent anesthesia complications

## 2013-04-10 NOTE — Progress Notes (Signed)
12 LEAD EKG DONE 

## 2013-04-10 NOTE — Anesthesia Preprocedure Evaluation (Addendum)
Anesthesia Evaluation  Patient identified by MRN, date of birth, ID band Patient awake    Reviewed: Allergy & Precautions, H&P , NPO status , Patient's Chart, lab work & pertinent test results  Airway Mallampati: II TM Distance: >3 FB Neck ROM: Full    Dental  (+) Dental Advisory Given   Pulmonary sleep apnea , pneumonia -,  breath sounds clear to auscultation        Cardiovascular hypertension, Pt. on medications and Pt. on home beta blockers + CAD and + CABG + Valvular Problems/Murmurs Rhythm:Regular Rate:Normal     Neuro/Psych negative neurological ROS  negative psych ROS   GI/Hepatic Neg liver ROS, GERD-  Medicated,  Endo/Other  diabetes, Type 2  Renal/GU negative Renal ROS     Musculoskeletal negative musculoskeletal ROS (+)   Abdominal (+) + obese,   Peds  Hematology negative hematology ROS (+)   Anesthesia Other Findings   Reproductive/Obstetrics                         Anesthesia Physical Anesthesia Plan  ASA: III  Anesthesia Plan: General   Post-op Pain Management:    Induction: Intravenous  Airway Management Planned: LMA and Oral ETT  Additional Equipment:   Intra-op Plan:   Post-operative Plan: Extubation in OR  Informed Consent: I have reviewed the patients History and Physical, chart, labs and discussed the procedure including the risks, benefits and alternatives for the proposed anesthesia with the patient or authorized representative who has indicated his/her understanding and acceptance.   Dental advisory given  Plan Discussed with: CRNA  Anesthesia Plan Comments:        Anesthesia Quick Evaluation

## 2013-04-10 NOTE — Progress Notes (Signed)
Dr. Lequita Halt to notify cardiologist

## 2013-04-10 NOTE — Transfer of Care (Signed)
Immediate Anesthesia Transfer of Care Note  Patient: [name] [name]  Procedure(s) Performed: Procedure(s) (LRB): RIGHT TOTAL KNEE REVISION (Right)  Patient Location: PACU  Anesthesia Type: General  Level of Consciousness: sedated, patient cooperative and responds to stimulaton  Airway & Oxygen Therapy: Patient Spontanous Breathing and Patient connected to face mask oxgen  Post-op Assessment: Report given to PACU RN and Post -op Vital signs reviewed and stable  Post vital signs: Reviewed and stable  Complications: No apparent anesthesia complications

## 2013-04-11 ENCOUNTER — Encounter (HOSPITAL_COMMUNITY): Payer: Self-pay | Admitting: Orthopedic Surgery

## 2013-04-11 DIAGNOSIS — Z951 Presence of aortocoronary bypass graft: Secondary | ICD-10-CM

## 2013-04-11 DIAGNOSIS — Z952 Presence of prosthetic heart valve: Secondary | ICD-10-CM

## 2013-04-11 LAB — CBC
HCT: 34.3 % — ABNORMAL LOW (ref 39.0–52.0)
Hemoglobin: 11.7 g/dL — ABNORMAL LOW (ref 13.0–17.0)
MCH: 32.4 pg (ref 26.0–34.0)
RBC: 3.61 MIL/uL — ABNORMAL LOW (ref 4.22–5.81)

## 2013-04-11 LAB — BASIC METABOLIC PANEL
BUN: 14 mg/dL (ref 6–23)
CO2: 26 mEq/L (ref 19–32)
Calcium: 8.3 mg/dL — ABNORMAL LOW (ref 8.4–10.5)
Glucose, Bld: 133 mg/dL — ABNORMAL HIGH (ref 70–99)
Potassium: 4.5 mEq/L (ref 3.5–5.1)
Sodium: 132 mEq/L — ABNORMAL LOW (ref 135–145)

## 2013-04-11 MED ORDER — METOPROLOL TARTRATE 100 MG PO TABS
100.0000 mg | ORAL_TABLET | Freq: Two times a day (BID) | ORAL | Status: DC
  Administered 2013-04-11 – 2013-04-15 (×8): 100 mg via ORAL
  Filled 2013-04-11 (×9): qty 1

## 2013-04-11 MED ORDER — SODIUM CHLORIDE 0.9 % IV BOLUS (SEPSIS)
250.0000 mL | Freq: Once | INTRAVENOUS | Status: AC
  Administered 2013-04-11: 250 mL via INTRAVENOUS

## 2013-04-11 NOTE — Progress Notes (Signed)
Subjective: 1 Day Post-Op Procedure(s) (LRB): RIGHT TOTAL KNEE REVISION (Right) Patient reports pain as mild and moderate.   Patient seen in rounds with Dr. Lequita Halt. Patient was well, but has had some minor complaints of pain in the knee, requiring pain medications We will start therapy today.  Plan is to go Home after hospital stay.  Objective: Vital signs in last 24 hours: Temp:  [97.4 F (36.3 C)-98.8 F (37.1 C)] 98.4 F (36.9 C) (07/10 0420) Pulse Rate:  [45-75] 67 (07/10 0420) Resp:  [12-20] 18 (07/10 0420) BP: (104-146)/(60-93) 116/61 mmHg (07/10 0420) SpO2:  [98 %-100 %] 99 % (07/10 0420) Weight:  [110.678 kg (244 lb)] 110.678 kg (244 lb) (07/09 1545)  Intake/Output from previous day:  Intake/Output Summary (Last 24 hours) at 04/11/13 0754 Last data filed at 04/11/13 0500  Gross per 24 hour  Intake 3076.67 ml  Output   1210 ml  Net 1866.67 ml    Intake/Output this shift: UOP about 200 since MN +2150  Labs:  Recent Labs  04/11/13 0449  HGB 11.7*    Recent Labs  04/11/13 0449  WBC 10.7*  RBC 3.61*  HCT 34.3*  PLT 233    Recent Labs  04/11/13 0449  NA 132*  K 4.5  CL 99  CO2 26  BUN 14  CREATININE 0.76  GLUCOSE 133*  CALCIUM 8.3*   No results found for this basename: LABPT, INR,  in the last 72 hours  EXAM General - Patient is Alert, Appropriate and Oriented Extremity - Neurovascular intact Sensation intact distally Dorsiflexion/Plantar flexion intact Dressing - dressing C/D/I Motor Function - intact, moving foot and toes well on exam.  Hemovac pulled without difficulty.  Past Medical History  Diagnosis Date  . Coronary artery disease   . Hypertension   . Hyperlipidemia   . Heart murmur   . Diabetes mellitus without complication   . GERD (gastroesophageal reflux disease)   . History of kidney stones   . History of gout   . Sleep apnea     USES C-PAP  . Cyst of eye     X3 CYSTS in left eyeHX OF LOSS OF VISION - RESOLVED AT  PRESENT  . Pneumonia     hx of  . Arthritis   . Still's disease 1997    in remission  . Complication of anesthesia     can not pee after surgery    Assessment/Plan: 1 Day Post-Op Procedure(s) (LRB): RIGHT TOTAL KNEE REVISION (Right) Principal Problem:   Failed total knee replacement Active Problems:   S/P CABG (coronary artery bypass graft)   H/O aortic valve replacement with porcine valve   OSA on CPAP   DVT (deep venous thrombosis)   Atrial flutter  Estimated body mass index is 34.5 kg/(m^2) as calculated from the following:   Height as of this encounter: 5' 10.5" (1.791 m).   Weight as of this encounter: 110.678 kg (244 lb). Advance diet Up with therapy Discharge home with home health when stable and met goals  Appreciate Cardiology Consult.  Noted recommendation of increasing the Xarelto to full dose.  Dr. Lequita Halt suggested waiting until tomorrow to increase the dose due to risk of further bleeding the first 48 hours.  Should be safer tomorrow to increase the dose.  DVT Prophylaxis - Xarelto, Aspirin and Plavix are on hold.  Resume both after three weeks of Xarelto treatment. Weight-Bearing as tolerated to right leg No vaccines. D/C O2 and Pulse OX and try  on Room [address]  [name] [name] 04/11/2013, 7:54 AM

## 2013-04-11 NOTE — Progress Notes (Signed)
Spoke with pt and wife at bedside concerning HH needs. They selected [facility]. Will need an order for HHRN/NA/PT, DME 3 in 1 BSC, RW.

## 2013-04-11 NOTE — Progress Notes (Signed)
04/11/13 1658  PT Visit Information  Last PT Received On 04/11/13  Reason Eval/Treat Not Completed Pain limiting ability to participate and pt had just been placed in CPM; Will see in am

## 2013-04-11 NOTE — Progress Notes (Signed)
[facility]  Subjective: Asymptomatic. Denies palpitations, chest discomfort, SOB, dizziness.  Objective: Vital signs in last 24 hours: Temp:  [97.4 F (36.3 C)-98.4 F (36.9 C)] 97.8 F (36.6 C) (07/10 1401) Pulse Rate:  [67-75] 68 (07/10 1401) Resp:  [16-20] 18 (07/10 1559) BP: (113-135)/(61-73) 113/63 mmHg (07/10 1401) SpO2:  [96 %-100 %] 97 % (07/10 1559) Last BM Date: 04/10/13  Intake/Output from previous day: 07/09 0701 - 07/10 0700 In: 3360 [P.O.:480; I.V.:2775; IV Piggyback:105] Out: 1210 [Urine:525; Drains:685] Intake/Output this shift: Total I/O In: 1320.8 [P.O.:640; I.V.:680.8] Out: 950 [Urine:950]  Medications Current Facility-Administered Medications  Medication Dose Route Frequency Provider Last Rate Last Dose  . 0.9 % NaCl with KCl 20 mEq/ L  infusion   Intravenous Continuous Alexzandrew Perkins, PA-C 50 mL/hr at 04/11/13 0837    . allopurinol (ZYLOPRIM) tablet 100 mg  100 mg Oral Daily Loanne Drilling, MD   100 mg at 04/11/13 1120  . bisacodyl (DULCOLAX) suppository 10 mg  10 mg Rectal Daily PRN Loanne Drilling, MD      . diphenhydrAMINE (BENADRYL) 12.5 MG/5ML elixir 12.5-25 mg  12.5-25 mg Oral Q4H PRN Loanne Drilling, MD      . docusate sodium (COLACE) capsule 100 mg  100 mg Oral BID Loanne Drilling, MD   100 mg at 04/11/13 1120  . donepezil (ARICEPT) tablet 10 mg  10 mg Oral QPM Loanne Drilling, MD   10 mg at 04/10/13 1731  . FLUoxetine (PROZAC) capsule 20 mg  20 mg Oral QPM Loanne Drilling, MD   20 mg at 04/10/13 1731  . insulin aspart (novoLOG) injection 0-15 Units  0-15 Units Subcutaneous TID WC Loanne Drilling, MD   3 Units at 04/11/13 1154  . menthol-cetylpyridinium (CEPACOL) lozenge 3 mg  1 lozenge Oral PRN Loanne Drilling, MD       Or  . phenol (CHLORASEPTIC) mouth spray 1 spray  1 spray Mouth/Throat PRN Loanne Drilling, MD      . methocarbamol (ROBAXIN) tablet 500 mg  500 mg Oral Q6H PRN Loanne Drilling, MD   500 mg at  04/10/13 1834   Or  . methocarbamol (ROBAXIN) 500 mg in dextrose 5 % 50 mL IVPB  500 mg Intravenous Q6H PRN Loanne Drilling, MD   500 mg at 04/10/13 1413  . metoCLOPramide (REGLAN) tablet 5-10 mg  5-10 mg Oral Q8H PRN Loanne Drilling, MD       Or  . metoCLOPramide (REGLAN) injection 5-10 mg  5-10 mg Intravenous Q8H PRN Loanne Drilling, MD      . metoprolol tartrate (LOPRESSOR) tablet 75 mg  75 mg Oral BID Loanne Drilling, MD   75 mg at 04/11/13 1120  . morphine 2 MG/ML injection 1-2 mg  1-2 mg Intravenous Q1H PRN Loanne Drilling, MD   2 mg at 04/11/13 1555  . multivitamin with minerals tablet 1 tablet  1 tablet Oral Daily Loanne Drilling, MD   1 tablet at 04/11/13 1120  . ondansetron (ZOFRAN) tablet 4 mg  4 mg Oral Q6H PRN Loanne Drilling, MD       Or  . ondansetron [facility]) injection 4 mg  4 mg Intravenous Q6H PRN Loanne Drilling, MD      . oxyCODONE (Oxy IR/ROXICODONE) immediate release tablet 5-10 mg  5-10 mg Oral Q3H PRN Loanne Drilling, MD   10 mg at 04/11/13 0144  . pantoprazole (  PROTONIX) EC tablet 40 mg  40 mg Oral QPM Loanne Drilling, MD   40 mg at 04/10/13 1731  . polyethylene glycol (MIRALAX / GLYCOLAX) packet 17 g  17 g Oral Daily PRN Loanne Drilling, MD      . ramipril (ALTACE) capsule 10 mg  10 mg Oral Daily Loanne Drilling, MD   10 mg at 04/11/13 1120  . Rivaroxaban (XARELTO) tablet 20 mg  20 mg Oral Q breakfast Chrystie Nose, MD   20 mg at 04/11/13 1119  . simvastatin (ZOCOR) tablet 20 mg  20 mg Oral QPM Loanne Drilling, MD   20 mg at 04/10/13 1731  . tamsulosin (FLOMAX) capsule 0.4 mg  0.4 mg Oral Daily Loanne Drilling, MD   0.4 mg at 04/11/13 1119  . temazepam (RESTORIL) capsule 30 mg  30 mg Oral QHS PRN Loanne Drilling, MD   30 mg at 04/10/13 2218  . traMADol (ULTRAM) tablet 50-100 mg  50-100 mg Oral Q6H PRN Loanne Drilling, MD        PE: General appearance: alert, cooperative and no distress Lungs: clear to auscultation bilaterally Heart: regular rate, irregular  rythym Extremities: no LEE Pulses: 2+ and symmetric Skin: warm and dry Neurologic: Grossly normal  Lab Results:   Recent Labs  04/11/13 0449  WBC 10.7*  HGB 11.7*  HCT 34.3*  PLT 233   BMET  Recent Labs  04/11/13 0449  NA 132*  K 4.5  CL 99  CO2 26  GLUCOSE 133*  BUN 14  CREATININE 0.76  CALCIUM 8.3*     Assessment/Plan  Principal Problem:   Failed total knee replacement Active Problems:   S/P CABG (coronary artery bypass graft)   H/O aortic valve replacement with porcine valve   OSA on CPAP   DVT (deep venous thrombosis)   Atrial flutter  Plan: Day 1 S/P re-do right TKR, found to be in post-operative atrial flutter yesterday. Telemetry shows continue A-flutter/fib. Ventricular response is contolled in the 70s. He is asymptomatic. Continue on Lopressor, at 75 mg BID. Xarelto was adjusted yesterday to full antithrombotic dose. ? Transfer to [facility] tomorrow with Dr. Rennis Golden, vs OP DDCV.    LOS: 1 day    Brittainy M. Delmer Islam 04/11/2013 4:01 PM   Patient seen and examined. Agree with assessment and plan. Pt is day 1 s/p re-do right TKR and developed post op a flutter. Ventricular rate now 80. Pt on xarelto with AC started within 24 hrs on onset. Will increase lopressor to 100 mg bid, plan ECG in am. Will keep NPO in am and re-asses labs and if still in A flutter possible cardioversion tomorrow if it can be arranged. Will not need TEE as per Dr. Rennis Golden.   Lennette Bihari, MD, [facility] 04/11/2013 4:28 PM

## 2013-04-11 NOTE — Evaluation (Signed)
Physical Therapy Evaluation Patient Details Name: [name] [name] MRN: [id number] DOB: 01/23/1935 Today's Date: 04/11/2013 Time: 2952-8413 PT Time Calculation (min): 25 min  PT Assessment / Plan / Recommendation History of Present Illness  s/p R TKA revision  Clinical Impression  Pt will benefit from PT due to deficits below to improve independence and facilitate transition to next venue of care HHPT with assist vs SNF;     PT Assessment  Patient needs continued PT services    Follow Up Recommendations  Home health PT;SNF;Supervision/Assistance - 24 hour (vs SNF if continued slow progress)    Does the patient have the potential to tolerate intense rehabilitation      Barriers to Discharge        Equipment Recommendations  None recommended by PT    Recommendations for Other Services     Frequency 7X/week    Precautions / Restrictions Precautions Precautions: Knee Required Braces or Orthoses: Knee Immobilizer - Right Knee Immobilizer - Right: Discontinue once straight leg raise with < 10 degree lag   Pertinent Vitals/Pain 8/10; premedicated and RN gave additional meds      Mobility  Bed Mobility Bed Mobility: Supine to Sit Supine to Sit: 1: +2 Total assist Supine to Sit: Patient Percentage: 60% Details for Bed Mobility Assistance: cues for self assist; incr time Transfers Transfers: Sit to Stand;Stand to Sit Sit to Stand: 1: +2 Total assist;With upper extremity assist;From elevated surface;From bed Sit to Stand: Patient Percentage: 60% Stand to Sit: To chair/3-in-1;1: +2 Total assist Stand to Sit: Patient Percentage: 70% Details for Transfer Assistance: +2 for safety and wt shift, to stabilize once standing Ambulation/Gait Ambulation/Gait Assistance: 1: +2 Total assist Ambulation/Gait: Patient Percentage: 70% Ambulation Distance (Feet): 5 Feet Assistive device: Rolling walker Ambulation/Gait Assistance Details: +2 for safety, wt shift, balance lines and chair  managment; pt fatigues rapidly and requires cues for all aspects of care Gait Pattern: Step-to pattern;Antalgic;Trunk flexed General Gait Details: unsteady    Exercises Total Joint Exercises Ankle Circles/Pumps: AROM;5 reps;Both   PT Diagnosis: Difficulty walking  PT Problem List: Decreased strength;Decreased range of motion;Decreased activity tolerance;Decreased balance;Decreased mobility;Decreased knowledge of use of DME;Decreased safety awareness;Decreased knowledge of precautions PT Treatment Interventions: DME instruction;Gait training;Stair training;Functional mobility training;Therapeutic activities;Therapeutic exercise;Balance training;Patient/family education     PT Goals(Current goals can be found in the care plan section) Acute Rehab PT Goals Patient Stated Goal: pt wants to go home PT Goal Formulation: With patient Time For Goal Achievement: 04/18/13 Potential to Achieve Goals: Good  Visit Information  Last PT Received On: 04/11/13 Assistance Needed: +2 History of Present Illness: s/p R TKA revision       Prior Functioning  Home Living Family/patient expects to be discharged to:: Private residence Living Arrangements: Spouse/significant other;Children Available Help at Discharge: Family Type of Home: House Home Access: Stairs to enter Secretary/administrator of Steps: 3 Home Layout: One level Home Equipment: Environmental consultant - 2 wheels Additional Comments: unsure about 3in1 Prior Function Level of Independence: Independent Communication Communication: No difficulties    Cognition  Cognition Arousal/Alertness: Awake/alert Behavior During Therapy: WFL for tasks assessed/performed Overall Cognitive Status: Within Functional Limits for tasks assessed    Extremity/Trunk Assessment Upper Extremity Assessment Upper Extremity Assessment: Defer to OT evaluation Lower Extremity Assessment Lower Extremity Assessment: RLE deficits/detail RLE Deficits / Details: ankle grossly  WFL; unable to tol knee ROM due to painl; assists minimally with SLR; LLE grossly WFL for activities assessed RLE: Unable to fully assess due to pain  Balance    End of Session PT - End of Session Activity Tolerance: Patient limited by fatigue;Patient limited by pain Patient left: in chair;with call bell/phone within reach;with nursing/sitter in room Nurse Communication: Mobility status  GP     [facility] 04/11/2013, 12:00 PM

## 2013-04-12 ENCOUNTER — Inpatient Hospital Stay (HOSPITAL_COMMUNITY): Payer: Medicare Other | Admitting: *Deleted

## 2013-04-12 ENCOUNTER — Encounter (HOSPITAL_COMMUNITY): Payer: Self-pay | Admitting: Gastroenterology

## 2013-04-12 ENCOUNTER — Encounter (HOSPITAL_COMMUNITY): Payer: Self-pay | Admitting: *Deleted

## 2013-04-12 ENCOUNTER — Encounter (HOSPITAL_COMMUNITY)
Admission: RE | Disposition: A | Discharge status: Skilled Nursing Facility | Payer: Self-pay | Source: Ambulatory Visit | Attending: Orthopedic Surgery

## 2013-04-12 HISTORY — PX: CARDIOVERSION: SHX1299

## 2013-04-12 LAB — GLUCOSE, CAPILLARY
Glucose-Capillary: 132 mg/dL — ABNORMAL HIGH (ref 70–99)
Glucose-Capillary: 120 mg/dL — ABNORMAL HIGH (ref 70–99)
Glucose-Capillary: 102 mg/dL — ABNORMAL HIGH (ref 70–99)

## 2013-04-12 LAB — BASIC METABOLIC PANEL
BUN: 11 mg/dL (ref 6–23)
Chloride: 100 mEq/L (ref 96–112)
Creatinine, Ser: 0.69 mg/dL (ref 0.50–1.35)
GFR calc Af Amer: >90 mL/min (ref >90)
Glucose, Bld: 153 mg/dL — ABNORMAL HIGH (ref 70–99)

## 2013-04-12 LAB — CBC
HCT: 32.8 % — ABNORMAL LOW (ref 39.0–52.0)
MCHC: 33.5 g/dL (ref 30.0–36.0)
MCV: 94 fL (ref 78.0–100.0)
RDW: 13.7 % (ref 11.5–15.5)
WBC: 12.8 10*3/uL — ABNORMAL HIGH (ref 4.0–10.5)

## 2013-04-12 SURGERY — CARDIOVERSION
Anesthesia: General

## 2013-04-12 MED ORDER — SODIUM CHLORIDE 0.9 % IJ SOLN: 3.0000 mL | INTRAMUSCULAR | Status: DC | PRN

## 2013-04-12 MED ORDER — LIDOCAINE HCL (CARDIAC) 20 MG/ML IV SOLN
INTRAVENOUS | Status: DC | PRN
  Administered 2013-04-12: 30 mg via INTRAVENOUS

## 2013-04-12 MED ORDER — PROPOFOL 10 MG/ML IV BOLUS
INTRAVENOUS | Status: DC | PRN
  Administered 2013-04-12: 30 mg via INTRAVENOUS

## 2013-04-12 MED ORDER — SODIUM CHLORIDE 0.9 % IJ SOLN: 3.0000 mL | Freq: Two times a day (BID) | INTRAMUSCULAR | Status: DC

## 2013-04-12 MED ORDER — SODIUM CHLORIDE 0.9 % IV SOLN
INTRAVENOUS | Status: DC | PRN
  Administered 2013-04-12: 14:00:00 via INTRAVENOUS

## 2013-04-12 MED ORDER — SODIUM CHLORIDE 0.9 % IV SOLN
INTRAVENOUS | Status: DC
  Administered 2013-04-12: 500 mL via INTRAVENOUS

## 2013-04-12 MED ORDER — SODIUM CHLORIDE 0.9 % IV SOLN: 250.0000 mL | INTRAVENOUS | Status: DC

## 2013-04-12 NOTE — Anesthesia Preprocedure Evaluation (Addendum)
Anesthesia Evaluation  Patient identified by MRN, date of birth, ID band Patient awake    Reviewed: Allergy & Precautions, H&P , NPO status , Patient's Chart, lab work & pertinent test results, reviewed documented beta blocker date and time   Airway Mallampati: III  Neck ROM: Full    Dental  (+) Teeth Intact and Dental Advisory Given   Pulmonary sleep apnea and Continuous Positive Airway Pressure Ventilation ,  breath sounds clear to auscultation        Cardiovascular hypertension, Pt. on medications and Pt. on home beta blockers + CAD + dysrhythmias Atrial Fibrillation + Valvular Problems/Murmurs Rhythm:Regular Rate:Normal  Impressions from Echo 2014,  - technically difficult study. S.B with RBBB. The right   ventricular systolic pressure was increased consistent   with mild pulmonary hypertension.   Normal appearing prosthetic pericadial valve gradient. EF 55%   Neuro/Psych    GI/Hepatic GERD-  Medicated and Controlled,  Endo/Other  diabetes  Renal/GU      Musculoskeletal   Abdominal Normal abdominal exam  (+)   Peds  Hematology   Anesthesia Other Findings   Reproductive/Obstetrics                           Anesthesia Physical Anesthesia Plan  ASA: III  Anesthesia Plan: General   Post-op Pain Management:    Induction: Intravenous  Airway Management Planned: Mask and Natural Airway  Additional Equipment:   Intra-op Plan:   Post-operative Plan:   Informed Consent: I have reviewed the patients History and Physical, chart, labs and discussed the procedure including the risks, benefits and alternatives for the proposed anesthesia with the patient or authorized representative who has indicated his/her understanding and acceptance.   Dental advisory given  Plan Discussed with: Anesthesiologist and Surgeon  Anesthesia Plan Comments:         Anesthesia Quick Evaluation

## 2013-04-12 NOTE — Anesthesia Postprocedure Evaluation (Signed)
  Anesthesia Post-op Note  Patient: [name] [name]  Procedure(s) Performed: Procedure(s): CARDIOVERSION (N/A)  Patient Location: PACU and Endoscopy Unit  Anesthesia Type:General  Level of Consciousness: awake, alert , oriented, patient cooperative and responds to stimulation  Airway and Oxygen Therapy: Patient Spontanous Breathing and Patient connected to nasal cannula oxygen  Post-op Pain: none  Post-op Assessment: Post-op Vital signs reviewed, Patient's Cardiovascular Status Stable and Respiratory Function Stable  Post-op Vital Signs: Reviewed and stable  Complications: No apparent anesthesia complications

## 2013-04-12 NOTE — Progress Notes (Signed)
Placed patient on CPAP at 11cm for the night 

## 2013-04-12 NOTE — OR Nursing (Signed)
Cardioverted at 1416 with 150J.

## 2013-04-12 NOTE — Preoperative (Signed)
Beta Blockers   Reason not to administer Beta Blockers:Not Applicable 

## 2013-04-12 NOTE — Progress Notes (Signed)
   Subjective: 2 Days Post-Op Procedure(s) (LRB): RIGHT TOTAL KNEE REVISION (Right) Patient reports pain as mild.   Patient seen in rounds with Dr. Lequita Halt. Patient is well, but has had some minor complaints of pain in the knee, requiring pain medications Plan is to go Home after hospital stay.  Objective: Vital signs in last 24 hours: Temp:  [97.8 F (36.6 C)-98.7 F (37.1 C)] 98.6 F (37 C) (07/11 0429) Pulse Rate:  [68-100] 92 (07/11 1026) Resp:  [17-18] 18 (07/11 0429) BP: (113-150)/(63-69) 150/68 mmHg (07/11 1026) SpO2:  [96 %-98 %] 98 % (07/11 0429)  Intake/Output from previous day:  Intake/Output Summary (Last 24 hours) at 04/12/13 1039 Last data filed at 04/12/13 0925  Gross per 24 hour  Intake 1771.66 ml  Output   1750 ml  Net  21.66 ml    Intake/Output this shift: Total I/O In: -  Out: 600 [Urine:600]  Labs:  Recent Labs  04/11/13 0449 04/12/13 0442  HGB 11.7* 11.0*    Recent Labs  04/11/13 0449 04/12/13 0442  WBC 10.7* 12.8*  RBC 3.61* 3.49*  HCT 34.3* 32.8*  PLT 233 205    Recent Labs  04/11/13 0449 04/12/13 0442  NA 132* 132*  K 4.5 4.6  CL 99 100  CO2 26 24  BUN 14 11  CREATININE 0.76 0.69  GLUCOSE 133* 153*  CALCIUM 8.3* 8.4   No results found for this basename: LABPT, INR,  in the last 72 hours  EXAM General - Patient is Alert, Appropriate and Oriented Extremity - Neurovascular intact Sensation intact distally Dorsiflexion/Plantar flexion intact No cellulitis present Dressing/Incision - clean, dry, no drainage, healing Motor Function - intact, moving foot and toes well on exam.   Past Medical History  Diagnosis Date  . Coronary artery disease   . Hypertension   . Hyperlipidemia   . Heart murmur   . Diabetes mellitus without complication   . GERD (gastroesophageal reflux disease)   . History of kidney stones   . History of gout   . Sleep apnea     USES C-PAP  . Cyst of eye     X3 CYSTS in left eyeHX OF LOSS OF  VISION - RESOLVED AT PRESENT  . Pneumonia     hx of  . Arthritis   . Still's disease 1997    in remission  . Complication of anesthesia     can not pee after surgery    Assessment/Plan: 2 Days Post-Op Procedure(s) (LRB): RIGHT TOTAL KNEE REVISION (Right) Principal Problem:   Failed total knee replacement Active Problems:   S/P CABG (coronary artery bypass graft)   H/O aortic valve replacement with porcine valve   OSA on CPAP   DVT (deep venous thrombosis)   Atrial flutter  Estimated body mass index is 34.5 kg/(m^2) as calculated from the following:   Height as of this encounter: 5' 10.5" (1.791 m).   Weight as of this encounter: 110.678 kg (244 lb). Up with therapy  DVT Prophylaxis - Xarelto, Increased by Cardiology to Full Dose. Weight-Bearing as tolerated to right leg  PERKINS, ALEXZANDREW 04/12/2013, 10:39 AM

## 2013-04-12 NOTE — Progress Notes (Signed)
PT Cancellation Note  Patient Details Name: [name] [name] MRN: [id number] DOB: 07-22-1935   Cancelled Treatment:    Reason Eval/Treat Not Completed: Pt declined-tired after procedure at [facility]. would rather wait until am.    Rebeca Alert, MPT Pager: [phone]

## 2013-04-12 NOTE — Transfer of Care (Signed)
Immediate Anesthesia Transfer of Care Note  Patient: [name] [name]  Procedure(s) Performed: Procedure(s): CARDIOVERSION (N/A)  Patient Location: PACU and Endoscopy Unit  Anesthesia Type:General  Level of Consciousness: awake, alert , oriented and patient cooperative  Airway & Oxygen Therapy: Patient Spontanous Breathing and Patient connected to nasal cannula oxygen  Post-op Assessment: Report given to PACU RN and Post -op Vital signs reviewed and stable  Post vital signs: Reviewed and stable  Complications: No apparent anesthesia complications

## 2013-04-12 NOTE — CV Procedure (Signed)
  CARDIOVERSION NOTE  Procedure: Electrical Cardioversion Indications:  Atrial Flutter  Procedure Details:  Consent: Risks of procedure as well as the alternatives and risks of each were explained to the (patient/caregiver).  Consent for procedure obtained.  Time Out: Verified patient identification, verified procedure, site/side was marked, verified correct patient position, special equipment/implants available, medications/allergies/relevent history reviewed, required imaging and test results available.  Performed  Patient placed on cardiac monitor, pulse oximetry, supplemental oxygen as necessary.  Sedation given: Propofol per anesthesia Pacer pads placed anterior and posterior chest.  Cardioverted 1 time(s).  Cardioverted at 150J biphasic.  Impression: Findings: Post procedure EKG shows: NSR Complications: None Patient did tolerate procedure well.  Plan: Return to [facility] - disposition per orthopedics service Continue Xarelto 20 mg daily. We will arrange follow-up with Dr. Alanda Amass in 1-2 weeks.  Time Spent Directly with the Patient:  30 minutes   Chrystie Nose, MD, [facility] Attending Cardiologist [facility]  Reynalda Canny C 04/12/2013, 2:26 PM

## 2013-04-12 NOTE — Progress Notes (Signed)
OT Cancellation Note  Patient Details Name: [name] [name] MRN: [id number] DOB: 24-Apr-1935   Cancelled Treatment:    Reason Eval/Treat Not Completed: Other (comment)  Pt had cardioversion done this afternoon.  Prefers to wait until tomorrow.    Jastin Fore 04/12/2013, 4:19 PM Marica Otter, OTR/L [phone] 04/12/2013

## 2013-04-12 NOTE — Progress Notes (Addendum)
PT NOTE  Pt scheduled for cardioversion procedure at [facility] ~12:00 per RN. Will hold PT this am and check back later this afternoon for PT tx session, if able-if not, will check back on tomorrow. Per RN, there is a question as to whether pt will return to [facility] or stay at [facility].  Rebeca Alert, PT [phone]

## 2013-04-12 NOTE — H&P (Signed)
   INTERVAL PROCEDURE H&P  History and Physical Interval Note:  04/12/2013 10:17 AM  Atrial flutter : planned DCCV today (he has been in a-flutter <48 hours on xarelto)  Donney Dice has presented today for their planned procedure. The various methods of treatment have been discussed with the patient and family. After consideration of risks, benefits and other options for treatment, the patient has consented to the procedure.  The patients' outpatient history has been reviewed, patient examined, and no change in status from most recent office note within the past 30 days. I have reviewed the patients' chart and labs and will proceed as planned. Questions were answered to the patient's satisfaction.   Chrystie Nose, MD, [facility] Attending Cardiologist [facility]  Salihah Peckham C 04/12/2013, 10:17 AM

## 2013-04-13 LAB — CBC
Hemoglobin: 10.6 g/dL — ABNORMAL LOW (ref 13.0–17.0)
MCH: 32.2 pg (ref 26.0–34.0)
MCV: 95.4 fL (ref 78.0–100.0)
RBC: 3.29 MIL/uL — ABNORMAL LOW (ref 4.22–5.81)
WBC: 10.9 10*3/uL — ABNORMAL HIGH (ref 4.0–10.5)

## 2013-04-13 LAB — GLUCOSE, CAPILLARY: Glucose-Capillary: 153 mg/dL — ABNORMAL HIGH (ref 70–99)

## 2013-04-13 MED ORDER — RIVAROXABAN 20 MG PO TABS: 20.0000 mg | ORAL_TABLET | Freq: Every day | ORAL | Status: DC

## 2013-04-13 MED ORDER — METHOCARBAMOL 500 MG PO TABS: 500.0000 mg | ORAL_TABLET | Freq: Four times a day (QID) | ORAL | Status: DC | PRN

## 2013-04-13 MED ORDER — OXYCODONE HCL 5 MG PO TABS: 5.0000 mg | ORAL_TABLET | ORAL | Status: DC | PRN

## 2013-04-13 NOTE — Progress Notes (Signed)
OT Cancellation Note  Patient Details Name: [name] [name] MRN: [id number] DOB: March 31, 1935   Cancelled Treatment:    Reason Eval/Treat Not Completed: Fatigue/lethargy limiting ability to participate;Pain limiting ability to participate.  Will reattempt  Kerin Cecchi, Ursula Alert M 04/13/2013, 3:52 PM

## 2013-04-13 NOTE — Progress Notes (Signed)
Physical Therapy Treatment Patient Details Name: [name] [name] MRN: [id number] DOB: 03/17/1935 Today's Date: 04/13/2013 Time: 1478-2956    PT Assessment / Plan / Recommendation  PT Comments   Pt for cardioversion yesterday, so did not have PT or mobility.  Today, he has some initial difficulty with pain and decreased quad activity in right leg; KI applied.  Pt was assisted to BCS then, with short distance ambulation. Hopeful he will progress ambulation with second PT session later today and continue with plan to d/c to home with HHPT.  If unable to progress, he may need short term SNF stay prior to home.  Follow Up Recommendations  Home health PT;SNF;Supervision/Assistance - 24 hour     Does the patient have the potential to tolerate intense rehabilitation     Barriers to Discharge        Equipment Recommendations  None recommended by PT    Recommendations for Other Services    Frequency     Progress towards PT Goals Progress towards PT goals: Progressing toward goals  Plan Current plan remains appropriate    Precautions / Restrictions Precautions Precautions: Knee Required Braces or Orthoses: Knee Immobilizer - Right Knee Immobilizer - Right: Discontinue once straight leg raise with < 10 degree lag (KI applied today as pt having difficulty wth quad activation) Restrictions Weight Bearing Restrictions: Yes LLE Weight Bearing: Weight bearing as tolerated   Pertinent Vitals/Pain Pt with pain in right LE with mobility,  RN in to provide to pain medicine    Mobility  Bed Mobility Bed Mobility: Supine to Sit Supine to Sit: 1: +2 Total assist Details for Bed Mobility Assistance: pt needs assist to move right leg.  Has pain in it with mobility Transfers Transfers: Sit to Stand;Stand to Sit Sit to Stand: 1: +2 Total assist Sit to Stand: Patient Percentage: 50% Stand to Sit: 1: +2 Total assist Stand to Sit: Patient Percentage: 50% Details for Transfer Assistance: Pt  struggliing to move this am, but seemed to do better as he once he got going. Ambulation/Gait Ambulation/Gait Assistance: 1: +2 Total assist Ambulation/Gait: Patient Percentage: 70% Ambulation Distance (Feet): 10 Feet Assistive device: Rolling walker Ambulation/Gait Assistance Details: needs assist for safety and balance with verbal cues for technique and walker management Gait Pattern: Step-to pattern;Trunk flexed;Decreased step length - right;Decreased step length - left;Antalgic Gait velocity: decreased with obvious increased effort to walk General Gait Details: pt tends to step too far into walker and keep trunk flexed. He then has difficulty achievnig balance to  advance walker  for next step Stairs: No Wheelchair Mobility Wheelchair Mobility: No    Exercises Total Joint Exercises Ankle Circles/Pumps: AROM;Both;Supine;10 reps Quad Sets: AROM;Both;10 reps;Supine Towel Squeeze: AROM;Both;5 reps;Supine Short Arc Quad: PROM;Right;10 reps (pt with difficutly with quad activation) Heel Slides: AROM;Left;5 reps;Supine Other Exercises Other Exercises: bilater UE AROM and hip to hip to activate core Other Exercises: abd sets   PT Diagnosis:    PT Problem List:   PT Treatment Interventions:     PT Goals (current goals can now be found in the care plan section)    Visit Information  Last PT Received On: 04/13/13 Assistance Needed: +2 History of Present Illness: s/p R TKA revision 7/9 , pt with cardioversion 7/11 therefore slow progress with PT    Subjective Data      Cognition  Cognition Arousal/Alertness: Awake/alert Behavior During Therapy: WFL for tasks assessed/performed Overall Cognitive Status: Within Functional Limits for tasks assessed    Balance  Static Sitting Balance Static Sitting - Balance Support: Bilateral upper extremity supported;Feet supported Static Sitting - Level of Assistance: 7: Independent Static Sitting - Comment/# of Minutes: pt with c/o pain in  right leg if it is not supported or extended way far out in front of him Static Standing Balance Static Standing - Balance Support: Bilateral upper extremity supported;During functional activity Static Standing - Level of Assistance: 4: Min assist  End of Session PT - End of Session Equipment Utilized During Treatment: Gait belt Activity Tolerance: Patient limited by fatigue;Patient limited by pain Patient left: in chair;with call bell/phone within reach Nurse Communication: Mobility status CPM Right Knee CPM Right Knee: Off   GP  Bayard Hugger. [place], [place] [phone]  04/13/2013, 9:47 AM

## 2013-04-13 NOTE — Progress Notes (Signed)
   Subjective: 1 Day Post-Op Procedure(s) (LRB): CARDIOVERSION (N/A) Patient reports pain as mild.   Plan is to go Home after hospital stay. Feels better overall post-cardioversion but still weak and has not done much with PT yet  Objective: Vital signs in last 24 hours: Temp:  [97.7 F (36.5 C)-98.7 F (37.1 C)] 98.7 F (37.1 C) (07/12 0500) Pulse Rate:  [64-92] 65 (07/12 0500) Resp:  [13-24] 18 (07/12 0500) BP: (145-161)/(63-99) 146/65 mmHg (07/12 0500) SpO2:  [96 %-100 %] 98 % (07/12 0500)  Intake/Output from previous day:  Intake/Output Summary (Last 24 hours) at 04/13/13 0831 Last data filed at 04/13/13 0745  Gross per 24 hour  Intake 1536.67 ml  Output    950 ml  Net 586.67 ml    Intake/Output this shift: Total I/O In: -  Out: 200 [Urine:200]  Labs:  Recent Labs  04/11/13 0449 04/12/13 0442 04/13/13 0520  HGB 11.7* 11.0* 10.6*    Recent Labs  04/12/13 0442 04/13/13 0520  WBC 12.8* 10.9*  RBC 3.49* 3.29*  HCT 32.8* 31.4*  PLT 205 208    Recent Labs  04/11/13 0449 04/12/13 0442  NA 132* 132*  K 4.5 4.6  CL 99 100  CO2 26 24  BUN 14 11  CREATININE 0.76 0.69  GLUCOSE 133* 153*  CALCIUM 8.3* 8.4   No results found for this basename: LABPT, INR,  in the last 72 hours  EXAM General - Patient is Alert, Appropriate and Oriented Extremity - Neurologically intact Neurovascular intact No cellulitis present Compartment soft Dressing/Incision - clean, dry, no drainage Motor Function - intact, moving foot and toes well on exam.   Past Medical History  Diagnosis Date  . Coronary artery disease   . Hypertension   . Hyperlipidemia   . Heart murmur   . Diabetes mellitus without complication   . GERD (gastroesophageal reflux disease)   . History of kidney stones   . History of gout   . Sleep apnea     USES C-PAP  . Cyst of eye     X3 CYSTS in left eyeHX OF LOSS OF VISION - RESOLVED AT PRESENT  . Pneumonia     hx of  . Arthritis   .  Still's disease 1997    in remission  . Complication of anesthesia     can not pee after surgery    Assessment/Plan: 1 Day Post-Op Procedure(s) (LRB): CARDIOVERSION (N/A) Principal Problem:   Failed total knee replacement Active Problems:   S/P CABG (coronary artery bypass graft)   H/O aortic valve replacement with porcine valve   OSA on CPAP   DVT (deep venous thrombosis)   Atrial flutter   Plan for discharge tomorrow Needs more progress with PT and I feel he wont be ready until tomorrow  DVT Prophylaxis - Xarelto Weight-Bearing as tolerated to right leg  Kafi Dotter V 04/13/2013, 8:31 AM

## 2013-04-13 NOTE — Progress Notes (Signed)
Physical Therapy Treatment Patient Details Name: [name] [name] MRN: [id number] DOB: 09-19-1935 Today's Date: 04/13/2013 Time: 4098-1191 PT Time Calculation (min): 35 min  PT Assessment / Plan / Recommendation  PT Comments   Pt needed to return to bathroom this session and was very fatigued upon returning.  He did work with me on exercises and was able to initiate better quad activity.  I talked with him about the possiblity of short term SNF to get as functional as possible prior to d/c to home.  He said he has a friend who went to [facility] after their knee replacement and they did well.  He would like to explore this option.  Follow Up Recommendations  SNF;Supervision/Assistance - 24 hour (pt agreed to look into short term SNF)     Does the patient have the potential to tolerate intense rehabilitation     Barriers to Discharge        Equipment Recommendations  None recommended by PT    Recommendations for Other Services    Frequency 7X/week   Progress towards PT Goals Progress towards PT goals: Progressing toward goals  Plan Discharge plan needs to be updated    Precautions / Restrictions Precautions Precautions: Knee Required Braces or Orthoses: Knee Immobilizer - Right Knee Immobilizer - Right: Discontinue once straight leg raise with < 10 degree lag (KI applied today as pt having difficulty wth quad activation) Restrictions Weight Bearing Restrictions: Yes LLE Weight Bearing: Weight bearing as tolerated   Pertinent Vitals/Pain Pain in right knee    Mobility  Bed Mobility Bed Mobility: Sit to Supine Supine to Sit: 1: +2 Total assist Sit to Supine: 3: Mod assist Details for Bed Mobility Assistance: pt needs assist to move right leg.  Has pain in it with mobility Transfers Transfers: Sit to Stand;Stand to Sit Sit to Stand: 1: +2 Total assist Sit to Stand: Patient Percentage: 50% Stand to Sit: 1: +2 Total assist Stand to Sit: Patient Percentage: 50% Details for  Transfer Assistance: Pt needs assist to lift hips up and move weight forward onto feet.  Ambulation/Gait Ambulation/Gait Assistance: 1: +2 Total assist Ambulation/Gait: Patient Percentage: 70% Ambulation Distance (Feet): 20 Feet Assistive device: Rolling walker Ambulation/Gait Assistance Details: multimodal cues for posture, safety, and to guide RW and verbal cues for sequence Gait Pattern: Step-to pattern;Trunk flexed;Decreased step length - right;Decreased step length - left;Antalgic Gait velocity: decreased with obvious increased effort to walk General Gait Details: pt tends to step too far into walker and keep trunk flexed. He then has difficulty achievnig balance to  advance walker  for next step Stairs: No Wheelchair Mobility Wheelchair Mobility: No    Exercises Total Joint Exercises Ankle Circles/Pumps: AROM;Both;Supine;10 reps Quad Sets: AROM;Both;10 reps;Supine Gluteal Sets: AROM;Both;10 reps;Standing Towel Squeeze: AROM;Both;5 reps;Supine Short Arc Quad: PROM;Right;10 reps (worked on isometric and eccentric quads over blanket roll) Heel Slides: 5 reps;Supine;AAROM;Right Straight Leg Raises: AAROM;Right;5 reps;Supine Long Arc Quad: AAROM;Right;10 reps;Seated Knee Flexion: AAROM;10 reps;Seated Goniometric ROM: 50 Other Exercises Other Exercises: bilater UE AROM and hip to hip to activate core Other Exercises: abd sets   PT Diagnosis:    PT Problem List:   PT Treatment Interventions:     PT Goals (current goals can now be found in the care plan section)    Visit Information  Last PT Received On: 04/13/13 Assistance Needed: +2 History of Present Illness: s/p R TKA revision 7/9 , pt with cardioversion 7/11 therefore slow progress with PT    Subjective Data  Cognition  Cognition Arousal/Alertness: Awake/alert Behavior During Therapy: WFL for tasks assessed/performed Overall Cognitive Status: Within Functional Limits for tasks assessed    Balance  Static  Sitting Balance Static Sitting - Balance Support: Bilateral upper extremity supported;Feet supported Static Sitting - Level of Assistance: 7: Independent Static Sitting - Comment/# of Minutes: pt with c/o pain in right leg if it is not supported or extended way far out in front of him Static Standing Balance Static Standing - Balance Support: Bilateral upper extremity supported;During functional activity Static Standing - Level of Assistance: 4: Min assist  End of Session PT - End of Session Equipment Utilized During Treatment: Gait belt Activity Tolerance: Patient limited by fatigue;Patient limited by pain Patient left: in bed;with call bell/phone within reach Nurse Communication: Mobility status CPM Right Knee CPM Right Knee: Off   GP     Donnetta Hail 04/13/2013, 12:20 PM

## 2013-04-14 LAB — GLUCOSE, CAPILLARY
Glucose-Capillary: 144 mg/dL — ABNORMAL HIGH (ref 70–99)
Glucose-Capillary: 163 mg/dL — ABNORMAL HIGH (ref 70–99)

## 2013-04-14 NOTE — Progress Notes (Signed)
Physical Therapy Treatment Patient Details Name: [name] [name] MRN: [id number] DOB: 06/27/1935 Today's Date: 04/14/2013 Time: 1125-1150 PT Time Calculation (min): 25 min  PT Assessment / Plan / Recommendation  PT Comments   *Pt continues to progress slowly. He ambulated 61' with RW today. Mobility limited by pain and fatigue. SNF recommended. **  Follow Up Recommendations  SNF;Supervision/Assistance - 24 hour (pt agreed to look into short term SNF)     Does the patient have the potential to tolerate intense rehabilitation     Barriers to Discharge        Equipment Recommendations  None recommended by PT    Recommendations for Other Services    Frequency 7X/week   Progress towards PT Goals Progress towards PT goals: Progressing toward goals  Plan Current plan remains appropriate    Precautions / Restrictions Precautions Precautions: Knee Required Braces or Orthoses: Knee Immobilizer - Right Knee Immobilizer - Right: Discontinue once straight leg raise with < 10 degree lag (KI applied today as pt having difficulty wth quad activation) Restrictions Weight Bearing Restrictions: Yes RLE Weight Bearing: Weight bearing as tolerated LLE Weight Bearing: Weight bearing as tolerated   Pertinent Vitals/Pain *8/10 R knee with activity Pain meds requested, ice applied**    Mobility  Bed Mobility Bed Mobility: Supine to Sit Supine to Sit: 3: Mod assist Details for Bed Mobility Assistance: pt needs assist to move right leg and to elevate trunk Transfers Transfers: Sit to Stand;Stand to Sit Sit to Stand: 1: +2 Total assist Sit to Stand: Patient Percentage: 60% Stand to Sit: 1: +2 Total assist Stand to Sit: Patient Percentage: 80% Details for Transfer Assistance: Pt needs assist to lift hips up and move weight forward onto feet.  Ambulation/Gait Ambulation/Gait Assistance: 1: +2 Total assist Ambulation/Gait: Patient Percentage: 80% Ambulation Distance (Feet): 30 Feet Assistive  device: Rolling walker Ambulation/Gait Assistance Details: distance limited by pain/fatigue Gait Pattern: Step-to pattern;Trunk flexed;Decreased step length - right;Decreased step length - left;Antalgic Gait velocity: decreased with obvious increased effort to walk General Gait Details: trunk flexed, able to correct with verbal cues Stairs: No Wheelchair Mobility Wheelchair Mobility: No    Exercises Total Joint Exercises Ankle Circles/Pumps: AROM;Both;Supine;10 reps Quad Sets: AROM;Both;10 reps;Supine Short Arc Quad: Right;10 reps;AAROM (worked on isometric and eccentric quads over blanket roll) Heel Slides: 5 reps;Supine;AAROM;Right Goniometric ROM: R knee flexion AROM 50*   PT Diagnosis:    PT Problem List:   PT Treatment Interventions:     PT Goals (current goals can now be found in the care plan section) Acute Rehab PT Goals Patient Stated Goal: pt wants to go home PT Goal Formulation: With patient Time For Goal Achievement: 04/18/13 Potential to Achieve Goals: Good  Visit Information  Last PT Received On: 04/14/13 Assistance Needed: +2 History of Present Illness: s/p R TKA revision 7/9 , pt with cardioversion 7/11 therefore slow progress with PT    Subjective Data  Patient Stated Goal: pt wants to go home   Cognition  Cognition Arousal/Alertness: Awake/alert Behavior During Therapy: WFL for tasks assessed/performed Overall Cognitive Status: Within Functional Limits for tasks assessed    Balance     End of Session PT - End of Session Equipment Utilized During Treatment: Gait belt Activity Tolerance: Patient limited by fatigue;Patient limited by pain Patient left: in bed;with call bell/phone within reach Nurse Communication: Mobility status   GP     Ralene Bathe Kistler 04/14/2013, 12:17 PM [phone]

## 2013-04-14 NOTE — Progress Notes (Addendum)
   Subjective: 2 Days Post-Op Procedure(s) (LRB): CARDIOVERSION (N/A)   Patient reports pain as mild with the knee. He states that he is still feeling very fatigued. States that it took one person, but would have been much better with 2 to get him to the bedside commode. He states that someone talked to him about a SNF yesterday and that he would like to explore that option.  Objective:   VITALS:   Filed Vitals:   04/14/13  BP: 146/71  Pulse: 64  Temp: 97.6 F (36.4 C)   Resp: 12    Neurovascular intact Dorsiflexion/Plantar flexion intact Incision: scant drainage No cellulitis present Compartment soft  LABS  Recent Labs  04/12/13 0442 04/13/13 0520  HGB 11.0* 10.6*  HCT 32.8* 31.4*  WBC 12.8* 10.9*  PLT 205 208     Recent Labs  04/12/13 0442  NA 132*  K 4.6  BUN 11  CREATININE 0.69  GLUCOSE 153*     Assessment/Plan: 2 Days Post-Op Procedure(s) (LRB): CARDIOVERSION (N/A)  Social work consult ordered Up with therapy Discharge home with home health vs SNF, whenever he is ready for d/c.   [name] [name]   PAC  04/14/2013, 9:16 AM

## 2013-04-15 ENCOUNTER — Telehealth: Payer: Self-pay | Admitting: Cardiovascular Disease

## 2013-04-15 ENCOUNTER — Encounter (HOSPITAL_COMMUNITY): Payer: Self-pay | Admitting: Internal Medicine

## 2013-04-15 LAB — GLUCOSE, CAPILLARY: Glucose-Capillary: 133 mg/dL — ABNORMAL HIGH (ref 70–99)

## 2013-04-15 MED ORDER — METHOCARBAMOL 500 MG PO TABS: 500.0000 mg | ORAL_TABLET | Freq: Four times a day (QID) | ORAL | Status: DC | PRN

## 2013-04-15 MED ORDER — OXYCODONE HCL 5 MG PO TABS: 5.0000 mg | ORAL_TABLET | ORAL | Status: AC | PRN

## 2013-04-15 MED ORDER — RIVAROXABAN 20 MG PO TABS: 20.0000 mg | ORAL_TABLET | Freq: Every day | ORAL | Status: AC

## 2013-04-15 NOTE — Progress Notes (Signed)
   Subjective: 3 Days Post-Op Procedure(s) (LRB): CARDIOVERSION (N/A) Patient reports pain as mild.   Plan is to go Skilled nursing facility after hospital stay.  Objective: Vital signs in last 24 hours: Temp:  [98.1 F (36.7 C)-98.8 F (37.1 C)] 98.1 F (36.7 C) (07/14 0514) Pulse Rate:  [61-63] 61 (07/14 0514) Resp:  [12-18] 18 (07/14 0514) BP: (116-166)/(47-76) 166/76 mmHg (07/14 0514) SpO2:  [97 %-100 %] 98 % (07/14 0514)  Intake/Output from previous day:  Intake/Output Summary (Last 24 hours) at 04/15/13 0704 Last data filed at 04/15/13 0517  Gross per 24 hour  Intake   1990 ml  Output   1700 ml  Net    290 ml    Intake/Output this shift:    Labs:  Recent Labs  04/13/13 0520  HGB 10.6*    Recent Labs  04/13/13 0520  WBC 10.9*  RBC 3.29*  HCT 31.4*  PLT 208   No results found for this basename: NA, K, CL, CO2, BUN, CREATININE, GLUCOSE, CALCIUM,  in the last 72 hours No results found for this basename: LABPT, INR,  in the last 72 hours  EXAM General - Patient is Alert, Appropriate and Oriented Extremity - Neurologically intact Neurovascular intact Incision: dressing C/D/I No cellulitis present Compartment soft Dressing/Incision - clean, dry, no drainage Motor Function - intact, moving foot and toes well on exam.   Past Medical History  Diagnosis Date  . Coronary artery disease   . Hypertension   . Hyperlipidemia   . Heart murmur   . Diabetes mellitus without complication   . GERD (gastroesophageal reflux disease)   . History of kidney stones   . History of gout   . Sleep apnea     USES C-PAP  . Cyst of eye     X3 CYSTS in left eyeHX OF LOSS OF VISION - RESOLVED AT PRESENT  . Pneumonia     hx of  . Arthritis   . Still's disease 1997    in remission  . Complication of anesthesia     can not pee after surgery    Assessment/Plan: 3 Days Post-Op Procedure(s) (LRB): CARDIOVERSION (N/A) Principal Problem:   Failed total knee  replacement Active Problems:   S/P CABG (coronary artery bypass graft)   H/O aortic valve replacement with porcine valve   OSA on CPAP   DVT (deep venous thrombosis)   Atrial flutter   Advance diet Up with therapy D/C IV fluids Discharge to SNF when bed arranged. Wants to go to [facility]  DVT Prophylaxis - Xarelto Weight-Bearing as tolerated to right leg  Lasean Rahming V 04/15/2013, 7:04 AM

## 2013-04-15 NOTE — Progress Notes (Signed)
OT  Note  Patient Details Name: [name] [name] MRN: [id number] DOB: 10/31/1934   Cancelled Treatment:    Reason Eval/Treat Not Completed: Other (comment) (plan is now STSNF. will defer OT eval to SNF)  Ryonna Cimini, Karin Golden D 04/15/2013, 10:08 AM

## 2013-04-15 NOTE — Progress Notes (Signed)
Patient discharge to [facility], report given to [facility] RN, PIV removed no s/s of swelling or infiltration on insertion site, surgical site dressing dry and intact no s/s of infection . P/u by [facility]

## 2013-04-15 NOTE — Progress Notes (Signed)
Patient is set to discharge to [facility] today. Patient & wife, Shary Key aware. Discharge packet in [place]. [facility] scheduled for transport pickup (Service Request Id: [id number]).  Clinical Social Work Department CLINICAL SOCIAL WORK PLACEMENT NOTE 04/15/2013  Patient:  GAL, FELDHAUS  Account Number:  [id number] Admit date:  04/10/2013  Clinical Social Worker:  Orpah Greek  Date/time:  04/15/2013 12:22 PM  Clinical Social Work is seeking post-discharge placement for this patient at the following level of care:   SKILLED NURSING   (*CSW will update this form in Epic as items are completed)   04/15/2013  Patient/family provided with [facility] Department of Clinical Social Work's list of facilities offering this level of care within the geographic area requested by the patient (or if unable, by the patient's family).  04/15/2013  Patient/family informed of their freedom to choose among providers that offer the needed level of care, that participate in Medicare, Medicaid or managed care program needed by the patient, have an available bed and are willing to accept the patient.  04/15/2013  Patient/family informed of [facility]' ownership interest in [facility], as well as of the fact that they are under no obligation to receive care at this facility.  PASARR submitted to EDS on 04/15/2013 PASARR number received from EDS on 04/15/2013  FL2 transmitted to all facilities in geographic area requested by pt/family on  04/15/2013 FL2 transmitted to all facilities within larger geographic area on   Patient informed that his/her managed care company has contracts with or will negotiate with  certain facilities, including the following:     Patient/family informed of bed offers received:  04/15/2013 Patient chooses bed at [facility] Physician recommends and patient chooses bed at    Patient to be transferred to [facility] SNF on  04/15/2013 Patient to be  transferred to facility by PTAR  The following physician request were entered in Epic:   Additional Comments:   Unice Bailey, LCSW [facility] Clinical Social Worker cell #: [phone]

## 2013-04-15 NOTE — Telephone Encounter (Signed)
Pt had knee surgery and will call back to schedule his fu appointment when he is able to. He is still in the hospital (7/14).

## 2013-04-15 NOTE — Progress Notes (Signed)
Physical Therapy Treatment Patient Details Name: [name] [name] MRN: [id number] DOB: [birth year] Today's Date: 04/15/2013 Time: 4098-1191 PT Time Calculation (min): 24 min  PT Assessment / Plan / Recommendation  PT Comments   Pre medicated pt was able to tolerate amb.  Pt progressing slower that expected so now plans to D/C to [facility].    Follow Up Recommendations  SNF     Does the patient have the potential to tolerate intense rehabilitation     Barriers to Discharge        Equipment Recommendations  None recommended by PT    Recommendations for Other Services    Frequency 7X/week   Progress towards PT Goals Progress towards PT goals: Progressing toward goals  Plan Current plan remains appropriate    Precautions / Restrictions Precautions Precautions: Knee Required Braces or Orthoses: Knee Immobilizer - Right Knee Immobilizer - Right: Discontinue once straight leg raise with < 10 degree lag Restrictions Weight Bearing Restrictions: No RLE Weight Bearing: Weight bearing as tolerated    Pertinent Vitals/Pain C/o 4/19 R knee pain     Mobility  Bed Mobility Bed Mobility: Not assessed Supine to Sit: 3: Mod assist Details for Bed Mobility Assistance: Pt OOB in recliner Transfers Transfers: Sit to Stand;Stand to Sit Sit to Stand: 1: +2 Total assist;From chair/3-in-1 Sit to Stand: Patient Percentage: 60% Stand to Sit: 1: +2 Total assist;To chair/3-in-1 Stand to Sit: Patient Percentage: 80% Details for Transfer Assistance: increased time and assist with R LE during stand to sit Ambulation/Gait Ambulation/Gait Assistance: 1: +2 Total assist Ambulation/Gait: Patient Percentage: 80% Ambulation Distance (Feet): 31 Feet Assistive device: Rolling walker Ambulation/Gait Assistance Details: increased time and MAX c/o fatigue.  Pt stated he has not slept well since he got here.  Ra 98% and HR 74. Gait Pattern: Step-to pattern;Trunk flexed;Decreased step length -  right;Decreased step length - left;Antalgic Gait velocity: decreased with obvious increased effort to walk General Gait Details: trunk flexed, able to correct with verbal cues    Exercises   Total Knee Replacement TE's 10 reps B LE ankle pumps 10 reps knee presses 10 reps heel slides  10 reps SAQ's 10 reps SLR's 10 reps ABD Followed by [facility]    PT Goals (current goals can now be found in the care plan section)    Visit Information  Last PT Received On: 04/15/13 Assistance Needed: +2 History of Present Illness: s/p R TKA revision 7/9 , pt with cardioversion 7/11 therefore slow progress with PT    Subjective Data      Cognition       Balance     End of Session PT - End of Session Equipment Utilized During Treatment: Gait belt Activity Tolerance: Patient limited by fatigue;Patient limited by pain Patient left: in chair Nurse Communication: Mobility status   Felecia Shelling  PTA [facility]  Acute  Rehab Pager      [phone]

## 2013-04-15 NOTE — Progress Notes (Signed)
Clinical Social Work Department BRIEF PSYCHOSOCIAL ASSESSMENT 04/15/2013  Patient:  THEON, SOBOTKA     Account Number:  [id number]     Admit date:  04/10/2013  Clinical Social Worker:  Orpah Greek  Date/Time:  04/15/2013 12:18 PM  Referred by:  Physician  Date Referred:  04/15/2013 Referred for  SNF Placement   Other Referral:   Interview type:  Patient Other interview type:   and wife    PSYCHOSOCIAL DATA Living Status:  WIFE Admitted from facility:   Level of care:   Primary support name:  Zanden Colver (wife) h#: [phone] c#: [phone] Primary support relationship to patient:  SPOUSE Degree of support available:   good    CURRENT CONCERNS Current Concerns  Post-Acute Placement   Other Concerns:    SOCIAL WORK ASSESSMENT / PLAN CSW received consult from MD that patient is now open to idea of SNF - requesting [facility] SNF.   Assessment/plan status:  Information/Referral to [facility] Other assessment/ plan:   Information/referral to community resources:   CSW completed FL2 and faxed information out to [facility] - confirmed with [facility] that they will be able to offer a bed for patient.    PATIENT'S/FAMILY'S RESPONSE TO PLAN OF CARE: Patient & wife were pleased to hear that [facility] would be able to take him today. Avel Peace, PA made aware & will do D/C Summary.       Unice Bailey, LCSW [facility] Clinical Social Worker cell #: [phone]

## 2013-04-15 NOTE — Discharge Summary (Signed)
Physician Discharge Summary   Patient ID: [name] [name] MRN: [id number] DOB/AGE: October 03, 1935 78 y.o.  Admit date: 04/10/2013 Discharge date: [id number]  Primary Diagnosis:  Unstable right total knee arthroplasty.  Admission Diagnoses:  Past Medical History  Diagnosis Date  . Coronary artery disease   . Hypertension   . Hyperlipidemia   . Heart murmur   . Diabetes mellitus without complication   . GERD (gastroesophageal reflux disease)   . History of kidney stones   . History of gout   . Sleep apnea     USES C-PAP  . Cyst of eye     X3 CYSTS in left eyeHX OF LOSS OF VISION - RESOLVED AT PRESENT  . Pneumonia     hx of  . Arthritis   . Still's disease 1997    in remission  . Complication of anesthesia     can not pee after surgery   Discharge Diagnoses:   Principal Problem:   Failed total knee replacement Active Problems:   S/P CABG (coronary artery bypass graft)   H/O aortic valve replacement with porcine valve   OSA on CPAP   DVT (deep venous thrombosis)   Atrial flutter  Estimated body mass index is 34.5 kg/(m^2) as calculated from the following:   Height as of this encounter: 5' 10.5" (1.791 m).   Weight as of this encounter: 110.678 kg (244 lb).  Procedure:  PROCEDURE #1 Right total knee arthroplasty revision.  Procedure #2 CARDIOVERSION (N/A)   Consults: cardiology - Chrystie Nose, MD, [facility]  HPI: [name] [name] is a 78 year old male, who has a  painful unstable right total knee arthroplasty. He has had progressive  worsening pain and instability to the point where it has given out on  him. He presents now for total knee arthroplasty revision.  Laboratory Data: Admission on 04/10/2013  Component Date Value Range Status  . Glucose-Capillary 04/10/2013 155* 70 - 99 mg/dL Final  . Glucose-Capillary 04/10/2013 156* 70 - 99 mg/dL Final  . Comment 1 78/46/9629 Documented in Chart   Final  . WBC 04/11/2013 10.7* 4.0 - 10.5 K/uL Final  . RBC 04/11/2013 3.61*  4.22 - 5.81 MIL/uL Final  . Hemoglobin 04/11/2013 11.7* 13.0 - 17.0 g/dL Final  . HCT 52/84/1324 34.3* 39.0 - 52.0 % Final  . MCV 04/11/2013 95.0  78.0 - 100.0 fL Final  . MCH 04/11/2013 32.4  26.0 - 34.0 pg Final  . MCHC 04/11/2013 34.1  30.0 - 36.0 g/dL Final  . RDW 40/07/2724 13.8  11.5 - 15.5 % Final  . Platelets 04/11/2013 233  150 - 400 K/uL Final  . Sodium 04/11/2013 132* 135 - 145 mEq/L Final  . Potassium 04/11/2013 4.5  3.5 - 5.1 mEq/L Final  . Chloride 04/11/2013 99  96 - 112 mEq/L Final  . CO2 04/11/2013 26  19 - 32 mEq/L Final  . Glucose, Bld 04/11/2013 133* 70 - 99 mg/dL Final  . BUN 36/64/4034 14  6 - 23 mg/dL Final  . Creatinine, Ser 04/11/2013 0.76  0.50 - 1.35 mg/dL Final  . Calcium 74/25/9563 8.3* 8.4 - 10.5 mg/dL Final  . GFR calc non Af Amer 04/11/2013 86* >90 mL/min Final  . GFR calc Af Amer 04/11/2013 >90  >90 mL/min Final   Comment:                                 The eGFR has been  calculated                          using the CKD EPI equation.                          This calculation has not been                          validated in all clinical                          situations.                          eGFR's persistently                          <90 mL/min signify                          possible Chronic Kidney Disease.  . Glucose-Capillary 04/10/2013 127* 70 - 99 mg/dL Final  . Glucose-Capillary 04/10/2013 165* 70 - 99 mg/dL Final  . Glucose-Capillary 04/11/2013 140* 70 - 99 mg/dL Final  . WBC 09/81/1914 12.8* 4.0 - 10.5 K/uL Final  . RBC 04/12/2013 3.49* 4.22 - 5.81 MIL/uL Final  . Hemoglobin 04/12/2013 11.0* 13.0 - 17.0 g/dL Final  . HCT 78/29/5621 32.8* 39.0 - 52.0 % Final  . MCV 04/12/2013 94.0  78.0 - 100.0 fL Final  . MCH 04/12/2013 31.5  26.0 - 34.0 pg Final  . MCHC 04/12/2013 33.5  30.0 - 36.0 g/dL Final  . RDW 30/86/5784 13.7  11.5 - 15.5 % Final  . Platelets 04/12/2013 205  150 - 400 K/uL Final  . Sodium 04/12/2013 132* 135 - 145 mEq/L  Final  . Potassium 04/12/2013 4.6  3.5 - 5.1 mEq/L Final  . Chloride 04/12/2013 100  96 - 112 mEq/L Final  . CO2 04/12/2013 24  19 - 32 mEq/L Final  . Glucose, Bld 04/12/2013 153* 70 - 99 mg/dL Final  . BUN 69/62/9528 11  6 - 23 mg/dL Final  . Creatinine, Ser 04/12/2013 0.69  0.50 - 1.35 mg/dL Final  . Calcium 41/32/4401 8.4  8.4 - 10.5 mg/dL Final  . GFR calc non Af Amer 04/12/2013 90* >90 mL/min Final  . GFR calc Af Amer 04/12/2013 >90  >90 mL/min Final   Comment:                                 The eGFR has been calculated                          using the CKD EPI equation.                          This calculation has not been                          validated in all clinical                          situations.  eGFR's persistently                          <90 mL/min signify                          possible Chronic Kidney Disease.  . Glucose-Capillary 04/11/2013 153* 70 - 99 mg/dL Final  . Glucose-Capillary 04/11/2013 216* 70 - 99 mg/dL Final  . Glucose-Capillary 04/11/2013 201* 70 - 99 mg/dL Final  . Glucose-Capillary 04/12/2013 132* 70 - 99 mg/dL Final  . Comment 1 14/78/2956 Notify RN   Final  . Comment 2 04/12/2013 Documented in Chart   Final  . Glucose-Capillary 04/12/2013 120* 70 - 99 mg/dL Final  . Comment 1 21/30/8657 Notify RN   Final  . Comment 2 04/12/2013 Documented in Chart   Final  . Glucose-Capillary 04/12/2013 129* 70 - 99 mg/dL Final  . Comment 1 84/69/6295 Notify RN   Final  . Comment 2 04/12/2013 Documented in Chart   Final  . WBC 04/13/2013 10.9* 4.0 - 10.5 K/uL Final  . RBC 04/13/2013 3.29* 4.22 - 5.81 MIL/uL Final  . Hemoglobin 04/13/2013 10.6* 13.0 - 17.0 g/dL Final  . HCT 28/41/3244 31.4* 39.0 - 52.0 % Final  . MCV 04/13/2013 95.4  78.0 - 100.0 fL Final  . MCH 04/13/2013 32.2  26.0 - 34.0 pg Final  . MCHC 04/13/2013 33.8  30.0 - 36.0 g/dL Final  . RDW 10/05/7251 14.0  11.5 - 15.5 % Final  . Platelets 04/13/2013 208  150 -  400 K/uL Final  . Glucose-Capillary 04/12/2013 102* 70 - 99 mg/dL Final  . Glucose-Capillary 04/13/2013 144* 70 - 99 mg/dL Final  . Glucose-Capillary 04/13/2013 112* 70 - 99 mg/dL Final  . Glucose-Capillary 04/13/2013 146* 70 - 99 mg/dL Final  . Glucose-Capillary 04/13/2013 153* 70 - 99 mg/dL Final  . Comment 1 66/44/0347 Notify RN   Final  . Glucose-Capillary 04/14/2013 128* 70 - 99 mg/dL Final  . Glucose-Capillary 04/14/2013 144* 70 - 99 mg/dL Final  . Glucose-Capillary 04/14/2013 163* 70 - 99 mg/dL Final  . Glucose-Capillary 04/14/2013 129* 70 - 99 mg/dL Final  . Comment 1 42/59/5638 Documented in Chart   Final  . Comment 2 04/14/2013 Notify RN   Final  . Glucose-Capillary 04/15/2013 133* 70 - 99 mg/dL Final  . Comment 1 75/64/3329 Documented in Chart   Final  . Comment 2 04/15/2013 Notify RN   Final  . Glucose-Capillary 04/15/2013 148* 70 - 99 mg/dL Final  . Comment 1 51/88/4166 Documented in Chart   Final  . Comment 2 04/15/2013 Notify RN   Final  Hospital Outpatient Visit on 04/02/2013  Component Date Value Range Status  . MRSA, PCR 04/02/2013 NEGATIVE  NEGATIVE Final  . Staphylococcus aureus 04/02/2013 NEGATIVE  NEGATIVE Final   Comment:                                 The Xpert SA Assay (FDA                          approved for NASAL specimens                          in patients over 57 years of age),  is one component of                          a comprehensive surveillance                          program.  Test performance has                          been validated by [facility] for patients greater                          than or equal to 60 year old.                          It is not intended                          to diagnose infection nor to                          guide or monitor treatment.  Marland Kitchen aPTT 04/02/2013 23* 24 - 37 seconds Final  . WBC 04/02/2013 14.9* 4.0 - 10.5 K/uL Final  . RBC 04/02/2013 4.51   4.22 - 5.81 MIL/uL Final  . Hemoglobin 04/02/2013 14.4  13.0 - 17.0 g/dL Final  . HCT 69/62/9528 42.6  39.0 - 52.0 % Final  . MCV 04/02/2013 94.5  78.0 - 100.0 fL Final  . MCH 04/02/2013 31.9  26.0 - 34.0 pg Final  . MCHC 04/02/2013 33.8  30.0 - 36.0 g/dL Final  . RDW 41/32/4401 14.3  11.5 - 15.5 % Final  . Platelets 04/02/2013 232  150 - 400 K/uL Final  . Sodium 04/02/2013 134* 135 - 145 mEq/L Final  . Potassium 04/02/2013 3.8  3.5 - 5.1 mEq/L Final  . Chloride 04/02/2013 98  96 - 112 mEq/L Final  . CO2 04/02/2013 23  19 - 32 mEq/L Final  . Glucose, Bld 04/02/2013 145* 70 - 99 mg/dL Final  . BUN 02/72/5366 12  6 - 23 mg/dL Final  . Creatinine, Ser 04/02/2013 0.71  0.50 - 1.35 mg/dL Final  . Calcium 44/12/4740 9.0  8.4 - 10.5 mg/dL Final  . Total Protein 04/02/2013 7.0  6.0 - 8.3 g/dL Final  . Albumin 59/56/3875 3.3* 3.5 - 5.2 g/dL Final  . AST 64/33/2951 24  0 - 37 U/L Final  . ALT 04/02/2013 19  0 - 53 U/L Final  . Alkaline Phosphatase 04/02/2013 65  39 - 117 U/L Final  . Total Bilirubin 04/02/2013 0.5  0.3 - 1.2 mg/dL Final  . GFR calc non Af Amer 04/02/2013 89* >90 mL/min Final  . GFR calc Af Amer 04/02/2013 >90  >90 mL/min Final   Comment:                                 The eGFR has been calculated                          using the CKD EPI equation.  This calculation has not been                          validated in all clinical                          situations.                          eGFR's persistently                          <90 mL/min signify                          possible Chronic Kidney Disease.  Marland Kitchen Prothrombin Time 04/02/2013 12.8  11.6 - 15.2 seconds Final  . INR 04/02/2013 0.98  0.00 - 1.49 Final  . Color, Urine 04/02/2013 AMBER* YELLOW Final   BIOCHEMICALS MAY BE AFFECTED BY COLOR  . APPearance 04/02/2013 CLEAR  CLEAR Final  . Specific Gravity, Urine 04/02/2013 1.027  1.005 - 1.030 Final  . pH 04/02/2013 5.5  5.0 - 8.0 Final  .  Glucose, UA 04/02/2013 100* NEGATIVE mg/dL Final  . Hgb urine dipstick 04/02/2013 NEGATIVE  NEGATIVE Final  . Bilirubin Urine 04/02/2013 SMALL* NEGATIVE Final  . Ketones, ur 04/02/2013 NEGATIVE  NEGATIVE mg/dL Final  . Protein, ur 40/98/1191 100* NEGATIVE mg/dL Final  . Urobilinogen, UA 04/02/2013 0.2  0.0 - 1.0 mg/dL Final  . Nitrite 47/82/9562 NEGATIVE  NEGATIVE Final  . Leukocytes, UA 04/02/2013 NEGATIVE  NEGATIVE Final  . ABO/RH(D) 04/02/2013 A POS   Final  . Antibody Screen 04/02/2013 NEG   Final  . Sample Expiration 04/02/2013 04/13/2013   Final  . RBC / HPF 04/02/2013 0-2  <3 RBC/hpf Final  . Casts 04/02/2013 HYALINE CASTS* NEGATIVE Final  . Urine-Other 04/02/2013 MUCOUS PRESENT   Final  . ABO/RH(D) 04/02/2013 A POS   Final     X-Rays:No results found.  EKG: Orders placed during the hospital encounter of 04/10/13  . EKG 12-LEAD  . EKG 12-LEAD  . EKG 12-LEAD  . EKG 12-LEAD  . EKG 12-LEAD     Hospital Course: ABDUL BEIRNE is a 78 y.o. who was admitted to [facility]. They were brought to the operating room on 04/10/2013 - 04/12/2013 and underwent Procedure(s): CARDIOVERSION.  Patient tolerated the procedure well and was later transferred to the recovery room and then to the orthopaedic floor for postoperative care.  They were given PO and IV analgesics for pain control following their surgery.  They were given 24 hours of postoperative antibiotics of  Anti-infectives   Start     Dose/Rate Route Frequency Ordered Stop   04/10/13 1800  ceFAZolin (ANCEF) IVPB 1 g/50 mL premix     1 g 100 mL/hr over 30 Minutes Intravenous Every 6 hours 04/10/13 1526 04/11/13 0010   04/10/13 0830  ceFAZolin (ANCEF) IVPB 2 g/50 mL premix     2 g 100 mL/hr over 30 Minutes Intravenous On call to O.R. 04/10/13 0820 04/10/13 1125     and started on DVT prophylaxis in the form of Xarelto.   PT and OT were ordered for total joint protocol.  Discharge planning consulted to help with postop  disposition and equipment needs. Cardiology consult was called postop due the patient being in atrial flutter. IMPRESSION:  1. Typical atrial flutter with 4:1 conduction - asymptomatic 2. H/O CABG - no chest pain 3. AVR - bioprosthetic 4. Trifascicular block 5. OSA - on CPAP RECOMMENDATION:  1. Mr. Gacek has typical atrial flutter with 4:1 conduction and is asymptomatic. He has a known underlying trifascicular block and is on a higher dose of metoprolol, so rate control is not an issue. I suspect that the flutter may have been a consequence of anesthesia. This could resolve on its own, as flutter is an inherently unstable rhythm. If not, he will need elective cardioversion. I would recommend increasing Xarelto to an antithrombotic dose for a-fib/flutter. If there is no spontaneous conversion tomorrow, we could consider elective cardioversion on Friday. Alternatively, it could be performed at any time as an outpatient (he will not need TEE, since anticoagulation was started within 48 hours of onset). I will discuss the case with Dr. Alanda Amass. Thanks for consulting [place].  Time Spent Directly with Patient:  30 minutes  Chrystie Nose, MD, [facility] POD 1 - Patient had a rough night on the evening of surgery.  They started to get up OOB with therapy on day one. Hemovac drain was pulled without difficulty.   Plan: Day 1 S/P re-do right TKR, found to be in post-operative atrial flutter yesterday. Telemetry shows continue A-flutter/fib. Ventricular response is contolled in the 70s. He is asymptomatic. Continue on Lopressor, at 75 mg BID. Xarelto was adjusted yesterday to full antithrombotic dose. ? Transfer to [facility] for [facility] tomorrow with Dr. Rennis Golden, vs OP DDCV. POD 2 - Planned for cardioversion per cardiology. Cardioverted 1 time(s).  Cardioverted at 150J biphasic.  Impression:  Findings: Post procedure EKG shows: NSR  Complications: None  Patient did tolerate procedure well.  Plan:  Return to [facility] -  disposition per orthopedics service  Continue Xarelto 20 mg daily.  We will arrange follow-up with Dr. Alanda Amass in 1-2 weeks. Dressing was changed on day two and the incision was healing well. DVT Prophylaxis - Xarelto, Increased by Cardiology to Full Dose. POD 3 -Felt better overall post-cardioversion but still weak and has not done much with PT yet. Planned for discharge the next day.  Needed more progress with PT and it was felt that he wouldn't be ready until the next day, POD 4 - Patient reported pain as mild with the knee. He stated that he was still feeling very fatigued. Stateed that it took one person, but would have been much better with 2 to get him to the bedside commode. He stated that someone talked to him about a SNF yesterday and that he wanted to explore that option. Social work consult ordered. Up with therapy  Discharge home with home health vs SNF, whenever he was ready for d/c. POD 5 - Seen in rounds by Dr. Lequita Halt.  Doing okay. Discharge to SNF when bed arranged. Wanted to go to [facility] Patient was seen in rounds and was ready to go to SNF once the social worker located bed.  Discharge Medications: Prior to Admission medications   Medication Sig Start Date End Date Taking? Authorizing Provider  allopurinol (ZYLOPRIM) 100 MG tablet Take 100 mg by mouth daily.    Yes Historical Provider, MD  B Complex-C (B-COMPLEX WITH VITAMIN C) tablet Take 1 tablet by mouth daily.   Yes Historical Provider, MD  donepezil (ARICEPT) 10 MG tablet Take 10 mg by mouth daily before breakfast.   Yes Historical Provider, MD  FLUoxetine (PROZAC) 20 MG capsule Take 20 mg by  mouth every evening.   Yes Historical Provider, MD  metFORMIN (GLUCOPHAGE) 500 MG tablet Take 500 mg by mouth 2 (two) times daily with a meal.   Yes Historical Provider, MD  metoprolol (LOPRESSOR) 50 MG tablet Take 75 mg by mouth 2 (two) times daily.    Yes Historical Provider, MD  omega-3 acid ethyl esters (LOVAZA) 1 G capsule  Take 1 g by mouth daily.   Yes Historical Provider, MD  pantoprazole (PROTONIX) 40 MG tablet Take 40 mg by mouth every evening.   Yes Historical Provider, MD  ramipril (ALTACE) 10 MG capsule Take 10 mg by mouth daily.    Yes Historical Provider, MD  simvastatin (ZOCOR) 20 MG tablet Take 20 mg by mouth every evening.   Yes Historical Provider, MD  temazepam (RESTORIL) 30 MG capsule Take 30 mg by mouth at bedtime as needed for sleep.   Yes Historical Provider, MD  traMADol (ULTRAM) 50 MG tablet Take 50 mg by mouth 2 (two) times daily.   Yes Historical Provider, MD  vitamin C (ASCORBIC ACID) 500 MG tablet Take 500 mg by mouth daily.   Yes Historical Provider, MD  vitamin E 1000 UNIT capsule Take 1,000 Units by mouth daily.   Yes Historical Provider, MD  aspirin EC 81 MG tablet Take 162 mg by mouth every evening.    Historical Provider, MD  methocarbamol (ROBAXIN) 500 MG tablet Take 1 tablet (500 mg total) by mouth every 6 (six) hours as needed. 04/13/13   Loanne Drilling, MD  Multiple Vitamin (MULTIVITAMIN WITH MINERALS) TABS Take 1 tablet by mouth daily.    Historical Provider, MD  oxyCODONE (OXY IR/ROXICODONE) 5 MG immediate release tablet Take 1-2 tablets (5-10 mg total) by mouth every 3 (three) hours as needed. 04/13/13   Loanne Drilling, MD  Rivaroxaban (XARELTO) 20 MG TABS Take 1 tablet (20 mg total) by mouth daily with breakfast. Take Xarelto for two and a half more weeks, then discontinue Xarelto.  04/13/13   Loanne Drilling, MD  Tamsulosin HCl (FLOMAX) 0.4 MG CAPS Take 0.4 mg by mouth daily.    Historical Provider, MD    Diet: Cardiac diet and Diabetic diet Activity:WBAT Follow-up:in 2 weeks Disposition - Skilled nursing facility - [facility] Discharged Condition: good       Discharge Orders   Future Orders Complete By Expires     Call MD / Call 911  As directed     Comments:      If you experience chest pain or shortness of breath, CALL 911 and be transported to the  hospital emergency room.  If you develope a fever above 101 F, pus (white drainage) or increased drainage or redness at the wound, or calf pain, call your surgeon's office.    Change dressing  As directed     Comments:      Change dressing daily with sterile 4 x 4 inch gauze dressing and apply TED hose. Do not submerge the incision under water.    Constipation Prevention  As directed     Comments:      Drink plenty of fluids.  Prune juice may be helpful.  You may use a stool softener, such as Colace (over the counter) 100 mg twice a day.  Use MiraLax (over the counter) for constipation as needed.    Diet - low sodium heart healthy  As directed     Diet Carb Modified  As directed     Discharge  instructions  As directed     Comments:      Pick up stool softner and laxative for home. Do not submerge incision under water. May shower. Continue to use ice for pain and swelling from surgery.   Take Xarelto for two and a half more weeks, then discontinue Xarelto. Once completed the Xarelto, the patient may resume their Plavix.  When discharged from the skilled rehab facility, please have the facility set up the patient's Home Health Physical Therapy prior to being released.  Also provide the patient with their medications at time of release from the facility to include their pain medication, the muscle relaxants, and their blood thinner medication.  If the patient is still at the rehab facility at time of follow up appointment, please also assist the patient in arranging follow up appointment in our office and any transportation needs.    Do not put a pillow under the knee. Place it under the heel.  As directed     Do not sit on low chairs, stoools or toilet seats, as it may be difficult to get up from low surfaces  As directed     Driving restrictions  As directed     Comments:      No driving until released by the physician.    Increase activity slowly as tolerated  As directed     Lifting  restrictions  As directed     Comments:      No lifting until released by the physician.    Patient may shower  As directed     Comments:      You may shower without a dressing once there is no drainage.  Do not wash over the wound.  If drainage remains, do not shower until drainage stops.    TED hose  As directed     Comments:      Use stockings (TED hose) for 3 weeks on both leg(s).  You may remove them at night for sleeping.    Weight bearing as tolerated  As directed      CPAP at night while at [facility]   Medication List    STOP taking these medications       clopidogrel 75 MG tablet  Commonly known as:  PLAVIX      TAKE these medications       allopurinol 100 MG tablet  Commonly known as:  ZYLOPRIM  Take 100 mg by mouth daily.     aspirin EC 81 MG tablet  Take 162 mg by mouth every evening.     B-complex with vitamin C tablet  Take 1 tablet by mouth daily.     donepezil 10 MG tablet  Commonly known as:  ARICEPT  Take 10 mg by mouth daily before breakfast.     FLUoxetine 20 MG capsule  Commonly known as:  PROZAC  Take 20 mg by mouth every evening.     metFORMIN 500 MG tablet  Commonly known as:  GLUCOPHAGE  Take 500 mg by mouth 2 (two) times daily with a meal.     methocarbamol 500 MG tablet  Commonly known as:  ROBAXIN  Take 1 tablet (500 mg total) by mouth every 6 (six) hours as needed.     metoprolol 50 MG tablet  Commonly known as:  LOPRESSOR  Take 75 mg by mouth 2 (two) times daily.     multivitamin with minerals Tabs  Take 1 tablet by mouth daily.  omega-3 acid ethyl esters 1 G capsule  Commonly known as:  LOVAZA  Take 1 g by mouth daily.     oxyCODONE 5 MG immediate release tablet  Commonly known as:  Oxy IR/ROXICODONE  Take 1-2 tablets (5-10 mg total) by mouth every 3 (three) hours as needed.     pantoprazole 40 MG tablet  Commonly known as:  PROTONIX  Take 40 mg by mouth every evening.     ramipril 10 MG capsule    Commonly known as:  ALTACE  Take 10 mg by mouth daily.     Rivaroxaban 20 MG Tabs  Commonly known as:  XARELTO  Take 1 tablet (20 mg total) by mouth daily with breakfast.     simvastatin 20 MG tablet  Commonly known as:  ZOCOR  Take 20 mg by mouth every evening.     tamsulosin 0.4 MG Caps  Commonly known as:  FLOMAX  Take 0.4 mg by mouth daily.     temazepam 30 MG capsule  Commonly known as:  RESTORIL  Take 30 mg by mouth at bedtime as needed for sleep.     traMADol 50 MG tablet  Commonly known as:  ULTRAM  Take 50 mg by mouth 2 (two) times daily.     vitamin C 500 MG tablet  Commonly known as:  ASCORBIC ACID  Take 500 mg by mouth daily.     vitamin E 1000 UNIT capsule  Take 1,000 Units by mouth daily.       Follow-up Information   Follow up with Loanne Drilling, MD. Schedule an appointment as soon as possible for a visit on 04/25/2013.   Contact information:   [address] [place] [address] [phone]       Schedule an appointment as soon as possible for a visit with Susa Griffins A, MD. (Follow up in 1-2 weeks.  Call for appointment as soon as possible.)    Contact information:   [address] [place] [address] [phone]       Signed: Patrica Duel 04/15/2013, 12:56 PM

## 2013-04-15 NOTE — Progress Notes (Signed)
Physical Therapy Treatment Patient Details Name: DECKLAN MAU MRN: [id number] DOB: 02/16/1935 Today's Date: 04/15/2013 Time: 8469-6295 PT Time Calculation (min): 15 min  PT Assessment / Plan / Recommendation  PT Comments   POD # 5 R TKRevision along with POD # 3 Cardiac Conversion.  Applied KI as pt was unable to perfrom SLR and instructed on use and when to D/C.  Assisted pt OOB to attempt amb however pt was unable to functionally take steps 2nd pain.  Ransfered pt to recliner and applied ICE and notified RN on pain maeds.    Follow Up Recommendations  SNF     Does the patient have the potential to tolerate intense rehabilitation     Barriers to Discharge        Equipment Recommendations  None recommended by PT    Recommendations for Other Services    Frequency 7X/week   Progress towards PT Goals Progress towards PT goals: Progressing toward goals  Plan Current plan remains appropriate    Precautions / Restrictions Precautions Precautions: Knee Required Braces or Orthoses: Knee Immobilizer - Right Knee Immobilizer - Right: Discontinue once straight leg raise with < 10 degree lag Restrictions Weight Bearing Restrictions: No RLE Weight Bearing: Weight bearing as tolerated    Pertinent Vitals/Pain 8/10 R knee pain meds requested ICE applied    Mobility  Bed Mobility Bed Mobility: Supine to Sit Supine to Sit: 3: Mod assist Details for Bed Mobility Assistance: pt needs assist to move right leg and to elevate trunk Transfers Transfers: Sit to Stand;Stand to Sit Sit to Stand: 1: +2 Total assist Sit to Stand: Patient Percentage: 60% Stand to Sit: 1: +2 Total assist Stand to Sit: Patient Percentage: 80% Details for Transfer Assistance: Pt needs assist to lift hips up and move weight forward onto feet.  Ambulation/Gait Ambulation/Gait Assistance Details: Unable to amb 2nd pain level so assisted to recliner.      PT Goals (current goals can now be found in the care  plan section)    Visit Information  Last PT Received On: 04/15/13 Assistance Needed: +2 History of Present Illness: s/p R TKA revision 7/9 , pt with cardioversion 7/11 therefore slow progress with PT    Subjective Data      Cognition       Balance     End of Session PT - End of Session Equipment Utilized During Treatment: Gait belt Activity Tolerance: Patient limited by fatigue;Patient limited by pain Nurse Communication: Mobility status   Felecia Shelling  PTA [facility]  Acute  Rehab Pager      [phone]

## 2013-04-29 ENCOUNTER — Other Ambulatory Visit: Payer: Self-pay | Admitting: Cardiovascular Disease

## 2013-04-29 LAB — CBC WITH DIFFERENTIAL/PLATELET
Basophils Absolute: 0 10*3/uL (ref 0.0–0.1)
Basophils Relative: 0 % (ref 0–1)
Eosinophils Absolute: 0.1 10*3/uL (ref 0.0–0.7)
Eosinophils Relative: 1 % (ref 0–5)
HCT: 36.6 % — ABNORMAL LOW (ref 39.0–52.0)
Hemoglobin: 12.3 g/dL — ABNORMAL LOW (ref 13.0–17.0)
MCH: 31.1 pg (ref 26.0–34.0)
MCHC: 33.6 g/dL (ref 30.0–36.0)
MCV: 92.4 fL (ref 78.0–100.0)
Monocytes Absolute: 0.8 10*3/uL (ref 0.1–1.0)
Monocytes Relative: 8 % (ref 3–12)
Neutro Abs: 7.5 10*3/uL (ref 1.7–7.7)
RDW: 14.4 % (ref 11.5–15.5)

## 2013-04-29 LAB — TSH: TSH: 1.873 u[IU]/mL (ref 0.350–4.500)

## 2013-04-29 LAB — COMPREHENSIVE METABOLIC PANEL
AST: 18 U/L (ref 0–37)
Albumin: 3.5 g/dL (ref 3.5–5.2)
Alkaline Phosphatase: 92 U/L (ref 39–117)
BUN: 12 mg/dL (ref 6–23)
Creat: 0.9 mg/dL (ref 0.50–1.35)
Glucose, Bld: 159 mg/dL — ABNORMAL HIGH (ref 70–99)
Potassium: 4.5 mEq/L (ref 3.5–5.3)
Total Bilirubin: 0.8 mg/dL (ref 0.3–1.2)

## 2013-04-29 LAB — MAGNESIUM: Magnesium: 1.5 mg/dL (ref 1.5–2.5)

## 2013-05-02 ENCOUNTER — Other Ambulatory Visit: Payer: Self-pay | Admitting: *Deleted

## 2013-05-02 DIAGNOSIS — R0989 Other specified symptoms and signs involving the circulatory and respiratory systems: Secondary | ICD-10-CM

## 2013-05-24 ENCOUNTER — Other Ambulatory Visit: Payer: Self-pay | Admitting: Cardiovascular Disease

## 2013-05-24 LAB — CBC WITH DIFFERENTIAL/PLATELET
Basophils Absolute: 0 10*3/uL (ref 0.0–0.1)
Basophils Relative: 0 % (ref 0–1)
HCT: 34.6 % — ABNORMAL LOW (ref 39.0–52.0)
Hemoglobin: 11.8 g/dL — ABNORMAL LOW (ref 13.0–17.0)
Lymphocytes Relative: 12 % (ref 12–46)
MCHC: 34.1 g/dL (ref 30.0–36.0)
Monocytes Relative: 7 % (ref 3–12)
Neutro Abs: 8.3 10*3/uL — ABNORMAL HIGH (ref 1.7–7.7)
Neutrophils Relative %: 80 % — ABNORMAL HIGH (ref 43–77)
WBC: 10.3 10*3/uL (ref 4.0–10.5)

## 2013-05-24 LAB — COMPREHENSIVE METABOLIC PANEL
ALT: 12 U/L (ref 0–53)
Albumin: 3.1 g/dL — ABNORMAL LOW (ref 3.5–5.2)
Alkaline Phosphatase: 78 U/L (ref 39–117)
CO2: 25 mEq/L (ref 19–32)
Potassium: 3.8 mEq/L (ref 3.5–5.3)
Sodium: 132 mEq/L — ABNORMAL LOW (ref 135–145)
Total Bilirubin: 0.7 mg/dL (ref 0.3–1.2)
Total Protein: 6 g/dL (ref 6.0–8.3)

## 2013-05-24 LAB — SEDIMENTATION RATE: Sed Rate: 30 mm/hr — ABNORMAL HIGH (ref 0–16)

## 2013-05-31 ENCOUNTER — Inpatient Hospital Stay (HOSPITAL_COMMUNITY): Admission: RE | Admit: 2013-05-31 | Payer: Medicare Other | Source: Ambulatory Visit

## 2013-08-03 DIAGNOSIS — I7410 Embolism and thrombosis of unspecified parts of aorta: Secondary | ICD-10-CM

## 2013-08-03 DIAGNOSIS — R578 Other shock: Secondary | ICD-10-CM

## 2013-08-03 HISTORY — DX: Other shock: R57.8

## 2013-08-03 HISTORY — DX: Embolism and thrombosis of unspecified parts of aorta: I74.10

## 2013-08-16 ENCOUNTER — Inpatient Hospital Stay (HOSPITAL_COMMUNITY)
Admission: EM | Admit: 2013-08-16 | Discharge: 2013-09-02 | DRG: 237 | Disposition: E | Payer: Medicare Other | Attending: Internal Medicine | Admitting: Internal Medicine

## 2013-08-16 ENCOUNTER — Encounter (HOSPITAL_COMMUNITY): Payer: Self-pay | Admitting: Emergency Medicine

## 2013-08-16 ENCOUNTER — Emergency Department (HOSPITAL_COMMUNITY): Payer: Medicare Other

## 2013-08-16 DIAGNOSIS — R112 Nausea with vomiting, unspecified: Secondary | ICD-10-CM | POA: Diagnosis not present

## 2013-08-16 DIAGNOSIS — Z113 Encounter for screening for infections with a predominantly sexual mode of transmission: Secondary | ICD-10-CM

## 2013-08-16 DIAGNOSIS — I700 Atherosclerosis of aorta: Secondary | ICD-10-CM | POA: Diagnosis present

## 2013-08-16 DIAGNOSIS — K219 Gastro-esophageal reflux disease without esophagitis: Secondary | ICD-10-CM | POA: Diagnosis present

## 2013-08-16 DIAGNOSIS — A419 Sepsis, unspecified organism: Secondary | ICD-10-CM | POA: Diagnosis not present

## 2013-08-16 DIAGNOSIS — Z7901 Long term (current) use of anticoagulants: Secondary | ICD-10-CM

## 2013-08-16 DIAGNOSIS — T84018S Broken internal joint prosthesis, other site, sequela: Secondary | ICD-10-CM

## 2013-08-16 DIAGNOSIS — I2510 Atherosclerotic heart disease of native coronary artery without angina pectoris: Secondary | ICD-10-CM | POA: Diagnosis present

## 2013-08-16 DIAGNOSIS — E876 Hypokalemia: Secondary | ICD-10-CM | POA: Diagnosis not present

## 2013-08-16 DIAGNOSIS — I743 Embolism and thrombosis of arteries of the lower extremities: Principal | ICD-10-CM | POA: Diagnosis present

## 2013-08-16 DIAGNOSIS — I4891 Unspecified atrial fibrillation: Secondary | ICD-10-CM | POA: Diagnosis present

## 2013-08-16 DIAGNOSIS — I76 Septic arterial embolism: Secondary | ICD-10-CM

## 2013-08-16 DIAGNOSIS — I442 Atrioventricular block, complete: Secondary | ICD-10-CM | POA: Diagnosis present

## 2013-08-16 DIAGNOSIS — Z79899 Other long term (current) drug therapy: Secondary | ICD-10-CM

## 2013-08-16 DIAGNOSIS — Z951 Presence of aortocoronary bypass graft: Secondary | ICD-10-CM

## 2013-08-16 DIAGNOSIS — Z87891 Personal history of nicotine dependence: Secondary | ICD-10-CM

## 2013-08-16 DIAGNOSIS — M083 Juvenile rheumatoid polyarthritis (seronegative): Secondary | ICD-10-CM | POA: Diagnosis present

## 2013-08-16 DIAGNOSIS — S8990XA Unspecified injury of unspecified lower leg, initial encounter: Secondary | ICD-10-CM | POA: Diagnosis present

## 2013-08-16 DIAGNOSIS — I998 Other disorder of circulatory system: Secondary | ICD-10-CM | POA: Diagnosis present

## 2013-08-16 DIAGNOSIS — D72829 Elevated white blood cell count, unspecified: Secondary | ICD-10-CM | POA: Diagnosis present

## 2013-08-16 DIAGNOSIS — Y831 Surgical operation with implant of artificial internal device as the cause of abnormal reaction of the patient, or of later complication, without mention of misadventure at the time of the procedure: Secondary | ICD-10-CM | POA: Diagnosis not present

## 2013-08-16 DIAGNOSIS — I4892 Unspecified atrial flutter: Secondary | ICD-10-CM | POA: Diagnosis not present

## 2013-08-16 DIAGNOSIS — Z781 Physical restraint status: Secondary | ICD-10-CM | POA: Diagnosis not present

## 2013-08-16 DIAGNOSIS — I10 Essential (primary) hypertension: Secondary | ICD-10-CM | POA: Diagnosis present

## 2013-08-16 DIAGNOSIS — Z66 Do not resuscitate: Secondary | ICD-10-CM | POA: Diagnosis not present

## 2013-08-16 DIAGNOSIS — R4182 Altered mental status, unspecified: Secondary | ICD-10-CM | POA: Diagnosis not present

## 2013-08-16 DIAGNOSIS — T826XXS Infection and inflammatory reaction due to cardiac valve prosthesis, sequela: Secondary | ICD-10-CM

## 2013-08-16 DIAGNOSIS — Z515 Encounter for palliative care: Secondary | ICD-10-CM

## 2013-08-16 DIAGNOSIS — Y849 Medical procedure, unspecified as the cause of abnormal reaction of the patient, or of later complication, without mention of misadventure at the time of the procedure: Secondary | ICD-10-CM | POA: Diagnosis not present

## 2013-08-16 DIAGNOSIS — R404 Transient alteration of awareness: Secondary | ICD-10-CM | POA: Diagnosis not present

## 2013-08-16 DIAGNOSIS — Z9889 Other specified postprocedural states: Secondary | ICD-10-CM

## 2013-08-16 DIAGNOSIS — I4510 Unspecified right bundle-branch block: Secondary | ICD-10-CM | POA: Diagnosis present

## 2013-08-16 DIAGNOSIS — E871 Hypo-osmolality and hyponatremia: Secondary | ICD-10-CM | POA: Diagnosis present

## 2013-08-16 DIAGNOSIS — I70229 Atherosclerosis of native arteries of extremities with rest pain, unspecified extremity: Secondary | ICD-10-CM | POA: Diagnosis present

## 2013-08-16 DIAGNOSIS — T826XXA Infection and inflammatory reaction due to cardiac valve prosthesis, initial encounter: Secondary | ICD-10-CM

## 2013-08-16 DIAGNOSIS — E118 Type 2 diabetes mellitus with unspecified complications: Secondary | ICD-10-CM | POA: Diagnosis present

## 2013-08-16 DIAGNOSIS — T827XXA Infection and inflammatory reaction due to other cardiac and vascular devices, implants and grafts, initial encounter: Secondary | ICD-10-CM | POA: Diagnosis present

## 2013-08-16 DIAGNOSIS — E46 Unspecified protein-calorie malnutrition: Secondary | ICD-10-CM | POA: Diagnosis present

## 2013-08-16 DIAGNOSIS — I739 Peripheral vascular disease, unspecified: Secondary | ICD-10-CM | POA: Diagnosis present

## 2013-08-16 DIAGNOSIS — Z952 Presence of prosthetic heart valve: Secondary | ICD-10-CM

## 2013-08-16 DIAGNOSIS — Z7982 Long term (current) use of aspirin: Secondary | ICD-10-CM

## 2013-08-16 DIAGNOSIS — I82409 Acute embolism and thrombosis of unspecified deep veins of unspecified lower extremity: Secondary | ICD-10-CM | POA: Diagnosis present

## 2013-08-16 DIAGNOSIS — I214 Non-ST elevation (NSTEMI) myocardial infarction: Secondary | ICD-10-CM | POA: Diagnosis not present

## 2013-08-16 DIAGNOSIS — Z953 Presence of xenogenic heart valve: Secondary | ICD-10-CM

## 2013-08-16 DIAGNOSIS — I509 Heart failure, unspecified: Secondary | ICD-10-CM | POA: Diagnosis present

## 2013-08-16 DIAGNOSIS — I359 Nonrheumatic aortic valve disorder, unspecified: Secondary | ICD-10-CM | POA: Diagnosis present

## 2013-08-16 DIAGNOSIS — G4733 Obstructive sleep apnea (adult) (pediatric): Secondary | ICD-10-CM | POA: Diagnosis present

## 2013-08-16 DIAGNOSIS — R578 Other shock: Secondary | ICD-10-CM | POA: Diagnosis present

## 2013-08-16 DIAGNOSIS — IMO0002 Reserved for concepts with insufficient information to code with codable children: Secondary | ICD-10-CM | POA: Diagnosis not present

## 2013-08-16 DIAGNOSIS — I498 Other specified cardiac arrhythmias: Secondary | ICD-10-CM | POA: Diagnosis not present

## 2013-08-16 DIAGNOSIS — I519 Heart disease, unspecified: Secondary | ICD-10-CM | POA: Diagnosis not present

## 2013-08-16 DIAGNOSIS — I330 Acute and subacute infective endocarditis: Secondary | ICD-10-CM | POA: Diagnosis present

## 2013-08-16 DIAGNOSIS — Z86718 Personal history of other venous thrombosis and embolism: Secondary | ICD-10-CM

## 2013-08-16 DIAGNOSIS — I96 Gangrene, not elsewhere classified: Secondary | ICD-10-CM | POA: Diagnosis present

## 2013-08-16 DIAGNOSIS — E785 Hyperlipidemia, unspecified: Secondary | ICD-10-CM | POA: Diagnosis present

## 2013-08-16 DIAGNOSIS — D62 Acute posthemorrhagic anemia: Secondary | ICD-10-CM | POA: Diagnosis not present

## 2013-08-16 DIAGNOSIS — F0390 Unspecified dementia without behavioral disturbance: Secondary | ICD-10-CM | POA: Diagnosis present

## 2013-08-16 HISTORY — DX: Personal history of transient ischemic attack (TIA), and cerebral infarction without residual deficits: Z86.73

## 2013-08-16 HISTORY — DX: Major depressive disorder, single episode, unspecified: F32.9

## 2013-08-16 HISTORY — DX: Peripheral vascular disease, unspecified: I73.9

## 2013-08-16 HISTORY — DX: Presence of aortocoronary bypass graft: Z95.1

## 2013-08-16 HISTORY — DX: Embolism and thrombosis of unspecified parts of aorta: I74.10

## 2013-08-16 HISTORY — DX: Depression, unspecified: F32.A

## 2013-08-16 HISTORY — DX: Personal history of other venous thrombosis and embolism: Z86.718

## 2013-08-16 HISTORY — DX: Unspecified atrial flutter: I48.92

## 2013-08-16 HISTORY — DX: Cerebral infarction, unspecified: I63.9

## 2013-08-16 HISTORY — DX: Nonrheumatic aortic (valve) stenosis: I35.0

## 2013-08-16 HISTORY — DX: Other shock: R57.8

## 2013-08-16 MED ORDER — POTASSIUM CHLORIDE CRYS ER 20 MEQ PO TBCR
20.0000 meq | EXTENDED_RELEASE_TABLET | Freq: Every day | ORAL | Status: DC
  Administered 2013-08-17: 20 meq via ORAL
  Filled 2013-08-16 (×2): qty 1

## 2013-08-16 MED ORDER — FLUOXETINE HCL 20 MG PO CAPS
20.0000 mg | ORAL_CAPSULE | Freq: Every day | ORAL | Status: DC
  Administered 2013-08-17: 20 mg via ORAL
  Filled 2013-08-16 (×2): qty 1

## 2013-08-16 MED ORDER — SIMVASTATIN 20 MG PO TABS
20.0000 mg | ORAL_TABLET | Freq: Every evening | ORAL | Status: DC
  Administered 2013-08-17: 20 mg via ORAL
  Filled 2013-08-16 (×2): qty 1

## 2013-08-16 MED ORDER — ADULT MULTIVITAMIN W/MINERALS CH
1.0000 | ORAL_TABLET | Freq: Every day | ORAL | Status: DC
  Filled 2013-08-16 (×2): qty 1

## 2013-08-16 MED ORDER — OXYCODONE HCL 5 MG PO TABS: 5.0000 mg | ORAL_TABLET | ORAL | Status: DC | PRN

## 2013-08-16 MED ORDER — INSULIN ASPART 100 UNIT/ML ~~LOC~~ SOLN
0.0000 [IU] | SUBCUTANEOUS | Status: DC
  Administered 2013-08-17: 2 [IU] via SUBCUTANEOUS
  Administered 2013-08-18 – 2013-08-20 (×3): 1 [IU] via SUBCUTANEOUS

## 2013-08-16 MED ORDER — TAMSULOSIN HCL 0.4 MG PO CAPS
0.4000 mg | ORAL_CAPSULE | Freq: Every day | ORAL | Status: DC
  Filled 2013-08-16 (×2): qty 1

## 2013-08-16 MED ORDER — SODIUM CHLORIDE 0.9 % IJ SOLN
3.0000 mL | Freq: Two times a day (BID) | INTRAMUSCULAR | Status: DC
  Administered 2013-08-16 – 2013-08-23 (×7): 3 mL via INTRAVENOUS

## 2013-08-16 MED ORDER — PANTOPRAZOLE SODIUM 40 MG PO TBEC: 40.0000 mg | DELAYED_RELEASE_TABLET | Freq: Every day | ORAL | Status: DC

## 2013-08-16 MED ORDER — DONEPEZIL HCL 10 MG PO TABS
10.0000 mg | ORAL_TABLET | Freq: Every day | ORAL | Status: DC
  Administered 2013-08-17: 10 mg via ORAL
  Filled 2013-08-16 (×2): qty 1

## 2013-08-16 MED ORDER — LORAZEPAM 1 MG PO TABS
1.0000 mg | ORAL_TABLET | Freq: Every day | ORAL | Status: DC
  Administered 2013-08-17: 1 mg via ORAL
  Filled 2013-08-16: qty 1

## 2013-08-16 MED ORDER — VITAMIN C 500 MG PO TABS
500.0000 mg | ORAL_TABLET | Freq: Every day | ORAL | Status: DC
  Filled 2013-08-16: qty 1

## 2013-08-16 MED ORDER — TEMAZEPAM 15 MG PO CAPS: 30.0000 mg | ORAL_CAPSULE | Freq: Every evening | ORAL | Status: DC | PRN

## 2013-08-16 MED ORDER — ATOMOXETINE HCL 10 MG PO CAPS: 10.0000 mg | ORAL_CAPSULE | Freq: Every evening | ORAL | Status: DC

## 2013-08-16 MED ORDER — TRAMADOL HCL 50 MG PO TABS
50.0000 mg | ORAL_TABLET | Freq: Two times a day (BID) | ORAL | Status: DC
  Administered 2013-08-17: 50 mg via ORAL
  Filled 2013-08-16: qty 1

## 2013-08-16 MED ORDER — ALLOPURINOL 100 MG PO TABS
100.0000 mg | ORAL_TABLET | Freq: Every day | ORAL | Status: DC
  Administered 2013-08-17: 100 mg via ORAL
  Filled 2013-08-16 (×2): qty 1

## 2013-08-16 MED ORDER — OMEGA-3-ACID ETHYL ESTERS 1 G PO CAPS
1.0000 g | ORAL_CAPSULE | Freq: Every day | ORAL | Status: DC
Start: 2013-08-17 — End: 2013-08-17
  Filled 2013-08-16: qty 1

## 2013-08-16 MED ORDER — RAMIPRIL 10 MG PO CAPS
10.0000 mg | ORAL_CAPSULE | Freq: Every day | ORAL | Status: DC
  Filled 2013-08-16: qty 1

## 2013-08-16 MED ORDER — METHOCARBAMOL 500 MG PO TABS
500.0000 mg | ORAL_TABLET | Freq: Four times a day (QID) | ORAL | Status: DC | PRN
  Filled 2013-08-16: qty 1

## 2013-08-16 MED ORDER — PREDNISONE 10 MG PO TABS
10.0000 mg | ORAL_TABLET | Freq: Every day | ORAL | Status: DC
  Filled 2013-08-16 (×2): qty 1

## 2013-08-16 MED ORDER — METOPROLOL TARTRATE 50 MG PO TABS
75.0000 mg | ORAL_TABLET | Freq: Two times a day (BID) | ORAL | Status: DC
  Filled 2013-08-16 (×3): qty 1

## 2013-08-16 MED ORDER — IOHEXOL 350 MG/ML SOLN
100.0000 mL | Freq: Once | INTRAVENOUS | Status: AC | PRN
  Administered 2013-08-16: 100 mL via INTRAVENOUS

## 2013-08-16 MED ORDER — ASPIRIN EC 81 MG PO TBEC
162.0000 mg | DELAYED_RELEASE_TABLET | Freq: Every day | ORAL | Status: DC
  Administered 2013-08-17: 162 mg via ORAL
  Filled 2013-08-16 (×2): qty 2

## 2013-08-16 NOTE — Consult Note (Addendum)
[facility]   Referred by:  [name]  Reason for referral: possible Left foot ischemia  History of Present Illness  [name] [name] is a 78 y.o. (1934-11-25) male with diabetes and hyperlipidemia who presents with chief complaint: purple left great toe.  Two weeks ago, patient fell and injuried his left foot.  His great toe on that side turned blue.  Over the last 2 weeks, he has not be able to resolve that injury.  He felt his left great toe was worsening in color and decided to go to [facility] at 1 PM.  The ED physician there notice asx femoral pulse and had pt transferred to [facility] for further evaluation.  Pain is described as aching, severity 3-6/10, and associated with manipulation of left great toe.  Patient has attempted to treat this pain with rest.  The patient has no rest pain symptoms also and no prior history of leg wounds/ulcers.  The wife notes a prior history of "bad circulation."  Per the family, the patient is on some form of anticoagulation.  Atherosclerotic risk factors include: HTN, Hyperlipidemia, and h/o smoking.  History is somewhat limited in this patient due to poor memory.  Past Medical History  Diagnosis Date  . Coronary artery disease   . Hypertension   . Hyperlipidemia   . Heart murmur   . Diabetes mellitus without complication   . GERD (gastroesophageal reflux disease)   . History of kidney stones   . History of gout   . Sleep apnea     USES C-PAP  . Cyst of eye     X3 CYSTS in left eyeHX OF LOSS OF VISION - RESOLVED AT PRESENT  . Pneumonia     hx of  . Arthritis   . Still's disease 1997    in remission  . Complication of anesthesia     can not pee after surgery   Past Surgical History  Procedure Laterality Date  . Joint replacement  1996/2005    bil  . Lung mass  1965    benign mass removed  . Colon resection      benign mass  . Hiatal hernia repair  2000  . Aortic valve replacement  2009  . Back surgery   2011  . Coronary artery bypass graft  2000    3 VESSELLS  . Lithotripsy    . Tonsillectomy  at 78 years old  . Hammer toe surgery Right     x2  . Bunionectomy Left   . Knee arthroscopy Right   . Total knee revision Right 04/10/2013    Procedure: RIGHT TOTAL KNEE REVISION;  Surgeon: Loanne Drilling, MD;  Location: [facility] ORS;  Service: Orthopedics;  Laterality: Right;  . Cardioversion N/A 04/12/2013    Procedure: CARDIOVERSION;  Surgeon: Chrystie Nose, MD;  Location: [facility] ENDOSCOPY;  Service: Cardiovascular;  Laterality: N/A;    History   Social History  . Marital Status: Married    Spouse Name: N/A    Number of Children: N/A  . Years of Education: N/A   Occupational History  . Not on file.   Social History Main Topics  . Smoking status: Former Smoker -- 5 years    Types: Cigarettes    Quit date: 11/21/1961  . Smokeless tobacco: Never Used  . Alcohol Use: Yes     Comment: rare  . Drug Use: No  . Sexual Activity: Not on file   Other Topics Concern  .  Not on file   Social History Narrative  . No narrative on file   Family History: patient cannot remember any medical problems in his father or mother  No current facility-administered medications on file prior to encounter.   Current Outpatient Prescriptions on File Prior to Encounter  Medication Sig Dispense Refill  . B Complex-C (B-COMPLEX WITH VITAMIN C) tablet Take 1 tablet by mouth daily.      . methocarbamol (ROBAXIN) 500 MG tablet Take 1 tablet (500 mg total) by mouth every 6 (six) hours as needed.  80 tablet  1  . metoprolol (LOPRESSOR) 50 MG tablet Take 75 mg by mouth 2 (two) times daily.       . Multiple Vitamin (MULTIVITAMIN WITH MINERALS) TABS Take 1 tablet by mouth daily.      Marland Kitchen omega-3 acid ethyl esters (LOVAZA) 1 G capsule Take 1 g by mouth daily.      Marland Kitchen oxyCODONE (OXY IR/ROXICODONE) 5 MG immediate release tablet Take 1-2 tablets (5-10 mg total) by mouth every 3 (three) hours as needed.  80 tablet  0  .  Rivaroxaban (XARELTO) 20 MG TABS Take 1 tablet (20 mg total) by mouth daily with breakfast.  21 tablet  1  . simvastatin (ZOCOR) 20 MG tablet Take 20 mg by mouth every evening.      . Tamsulosin HCl (FLOMAX) 0.4 MG CAPS Take 0.4 mg by mouth daily.      . temazepam (RESTORIL) 30 MG capsule Take 30 mg by mouth at bedtime as needed for sleep.      . traMADol (ULTRAM) 50 MG tablet Take 50 mg by mouth 2 (two) times daily.      . vitamin C (ASCORBIC ACID) 500 MG tablet Take 500 mg by mouth daily.      . vitamin E 1000 UNIT capsule Take 1,000 Units by mouth daily.        Allergies  Allergen Reactions  . Bee Venom    REVIEW OF SYSTEMS:  (Positives checked otherwise negative)  CARDIOVASCULAR:  []  chest pain, []  chest pressure, []  palpitations, []  shortness of breath when laying flat, []  shortness of breath with exertion,  [x]  pain in feet when walking, [x]  pain in feet when laying flat, [x]  history of blood clot in veins (DVT), []  history of phlebitis, []  swelling in legs, []  varicose veins  PULMONARY:  []  productive cough, []  asthma, []  wheezing  NEUROLOGIC:  []  weakness in arms or legs, []  numbness in arms or legs, []  difficulty speaking or slurred speech, [x]  temporary loss of vision in one eye, []  dizziness  HEMATOLOGIC:  []  bleeding problems, [x]  problems with blood clotting too easily  MUSCULOSKEL:  [x]  joint pain, []  joint swelling  GASTROINTEST:  []  vomiting blood, []  blood in stool     GENITOURINARY:  []  burning with urination, []  blood in urine, [x]  incontinence, [x]  frequency  PSYCHIATRIC:  []  history of major depression  INTEGUMENTARY:  []  rashes, []  ulcers  CONSTITUTIONAL:  []  fever, []  chills  For VQI Use Only  PRE-ADM LIVING: Home  AMB STATUS: Ambulatory  CAD Sx: None  PRIOR CHF: None  STRESS TEST: [x]  No, [ ]  Normal, [ ]  + ischemia, [ ]  + MI, [ ]  Both  Physical Examination  Filed Vitals:   08/24/2013 1934  BP: 114/78  Pulse: 85  Temp: 97.5 F (36.4 C)   TempSrc: Oral  Resp: 14  SpO2: 98%   There is no weight on file to calculate BMI.  General: A&O x 3, elderly, thin  Head: [place]/AT  Ear/Nose/Throat: Hearing grossly intact, nares w/o erythema or drainage, oropharynx w/o Erythema/Exudate, Mallampati score: 3  Eyes: PERRLA, EOMI  Neck: Supple, no nuchal rigidity, no palpable LAD  Pulmonary: Sym exp, good air movt, CTAB, no rales, rhonchi, & wheezing  Cardiac: RRR, Nl S1, S2, no rubs or gallops, +HSM  Vascular: Vessel Right Left  Radial Palpable Palpable  Brachial Palpable Palpable  Carotid Palpable, without bruit Palpable, without bruit  Aorta Not palpable N/A  Femoral Palpable Faintly Palpable  Popliteal Faintly palpable Not palpable  PT Faintly Palpable Not Palpable  DP Palpable Not Palpable   Gastrointestinal: soft, NTND, -G/R, - HSM, - masses, - CVAT B, incontinent with diaper  Musculoskeletal: M/S 5/5 throughout including 3-4/5 B PF/DF, R foot without ischemic changes, L great toe with echymosis and small area of black eschar, petechiae on dorsum of left foot, no ascending erythema   Neurologic: CN 2-12 intact , Pain and light touch intact in extremities including left foot, Motor exam as listed above  Psychiatric: Judgment intact, Mood & affect appropriate for pt's clinical situation, some psychomotor slowing  Dermatologic: See M/S exam for extremity exam, no rashes otherwise noted  Lymph : No Cervical, Axillary, or Inguinal lymphadenopathy   Laboratory: CBC:    Component Value Date/Time   WBC 10.3 05/24/2013 1035   RBC 3.88* 05/24/2013 1035   HGB 11.8* 05/24/2013 1035   HCT 34.6* 05/24/2013 1035   PLT 332 05/24/2013 1035   MCV 89.2 05/24/2013 1035   MCH 30.4 05/24/2013 1035   MCHC 34.1 05/24/2013 1035   RDW 15.0 05/24/2013 1035   LYMPHSABS 1.2 05/24/2013 1035   MONOABS 0.7 05/24/2013 1035   EOSABS 0.1 05/24/2013 1035   BASOSABS 0.0 05/24/2013 1035    BMP:    Component Value Date/Time   NA 132* 05/24/2013 1035    K 3.8 05/24/2013 1035   CL 97 05/24/2013 1035   CO2 25 05/24/2013 1035   GLUCOSE 133* 05/24/2013 1035   [name] 7 05/24/2013 1035   CREATININE 0.69 05/24/2013 1035   CREATININE 0.69 04/12/2013 0442   CALCIUM 8.5 05/24/2013 1035   GFRNONAA 90* 04/12/2013 0442   GFRAA >90 04/12/2013 0442    Coagulation: Lab Results  Component Value Date   INR 0.98 04/02/2013   INR 1.06 11/21/2012   INR 0.93 09/06/2010   No results found for this basename: PTT   Outside labs reviewed: PT elevated, Cr 1.36  Medical Decision Making  [name] [name] is a 78 y.o. male who presents with: likely acute on chronic L leg ischemia, multiple co-morbidities including: DM, Hyperlipidemia, s/p CABG and AVR, recent cardioversion for Afib, h/o DVT critical limb ischemia.   This patient does NOT have a threatened leg, as if he had acute occlusion, he would already have a non-viable leg as >6 hours has already elapsed.  He also has intact motor and sensation.  With multiple atherosclerotic risk factors, this patient likely has baseline peripheral arterial disease.  The history from the family is consistent with such.  The patient has no history or evidence of recurrent cardiac arrhythmia which might account for an embolism.  Additionally, the patient is on full dose anticoagulation with Xarelto, which would decrease the probability of such.  After the Xarelto resolves in 24-48 hours, I would start a Heparin drip to help limit any further thrombosis.  Per the Pharmacy, elderly patients have extended half-life for the drugs, so he is likely  fully anticoagulate for > 24 hours.  Subsequently, any intervention at this point would likely be complicated by uncontrolled bleeding.  At this time of night, unfortunately, the only diagnostic test available is a CTA Ao with BRo.  Further intervention will be dependent on those findings.  This patient should be admitted to a hospitalist service for medical optimization including cardiac  optimization in case surgical intervention is necessary.  Further studies including a formal aortogram with bilateral leg runoff may be necessary if revascularization is necessary, as in my experience, the accuracy of the CTA runoff can be spotty at best.  I discussed in depth with the patient the nature of atherosclerosis, and emphasized the importance of maximal medical management including strict control of blood pressure, blood glucose, and lipid levels, antiplatelet agents, obtaining regular exercise, and cessation of smoking.  The patient is aware that without maximal medical management the underlying atherosclerotic disease process will progress, limiting the benefit of any interventions. The patient is currently on a statin:  Zocor.   The patient is on an anticoagulant: Xarleto Further recommendation dependent on the CTA.  Thank you for allowing [place] to participate in this patient's care.  Leonides Sake, MD [facility] [place] Office: [phone] Pager: [phone]  08/07/2013, 8:53 PM

## 2013-08-16 NOTE — ED Provider Notes (Signed)
CSN: [id number]     Arrival date & time    History   First MD Initiated Contact with Patient 08/30/2013 1949     Chief Complaint  Patient presents with  . Foot Pain    HPI Patient transferred from [facility] at [facility] with discoloration to the left great toe which is acute in onset.  History of diabetes.  Past medical history of coronary artery disease and hypertension.  Past history of gout and kidney stones. Past Medical History  Diagnosis Date  . Coronary artery disease 2000    s/p CABG x 4  . Hypertension   . Hyperlipidemia   . S/P CABG x 4   . Diabetes mellitus without complication 2000  . GERD (gastroesophageal reflux disease)   . History of kidney stones   . History of gout   . Sleep apnea     USES C-PAP  . Cyst of eye     X3 CYSTS in left eyeHX OF LOSS OF VISION - RESOLVED AT PRESENT  . Pneumonia     hx of  . Arthritis   . Still's disease 1997    in remission  . Complication of anesthesia     can not pee after surgery  . H/O Aortic stenosis, severe 2009    s/p AVR; Echo 12/2012 - well seated valve.  EF ~50-55%, mild Ant-septal HK.  Marland Kitchen History of embolic stroke without residual deficits     Noted on MRI 05/2013  . History of DVT (deep vein thrombosis)   . Paroxysmal atrial flutter 04/10/2013    was on Xarelto   Past Surgical History  Procedure Laterality Date  . Joint replacement  1996/2005    bil  . Lung mass  1965    benign mass removed  . Colon resection      benign mass  . Hiatal hernia repair  2000  . Aortic valve replacement  2009  . Back surgery  2011  . Coronary artery bypass graft  2000    4 VESSELLS  . Lithotripsy    . Tonsillectomy  at 78 years old  . Hammer toe surgery Right     x2  . Bunionectomy Left   . Knee arthroscopy Right   . Total knee revision Right 04/10/2013    Procedure: RIGHT TOTAL KNEE REVISION;  Surgeon: Loanne Drilling, MD;  Location: [facility] ORS;  Service: Orthopedics;  Laterality: Right;  . Cardioversion N/A 04/12/2013    Procedure:  CARDIOVERSION;  Surgeon: Chrystie Nose, MD;  Location: [facility] ENDOSCOPY;  Service: Cardiovascular;  Laterality: N/A;  . Nm myoview ltd  04/2012    Lexiscan: No evidence of Ischemia   History reviewed. No pertinent family history. History  Substance Use Topics  . Smoking status: Former Smoker -- 5 years    Types: Cigarettes    Quit date: 11/21/1961  . Smokeless tobacco: Never Used  . Alcohol Use: Yes     Comment: rare    Review of Systems  All other systems reviewed and are negative.    Allergies  Bee venom  Home Medications   No current outpatient prescriptions on file. BP 95/60  Pulse 94  Temp(Src) 98.7 F (37.1 C) (Oral)  Resp 22  Ht 5\' 11"  (1.803 m)  Wt 194 lb 10.7 oz (88.3 kg)  BMI 27.16 kg/m2  SpO2 99% Physical Exam  Nursing note and vitals reviewed. Constitutional: He is oriented to person, place, and time. He appears well-developed and well-nourished. No distress.  HENT:  Head: Normocephalic and atraumatic.  Eyes: Pupils are equal, round, and reactive to light.  Neck: Normal range of motion.  Cardiovascular: Normal rate and intact distal pulses.   Pulmonary/Chest: No respiratory distress.  Abdominal: Normal appearance. He exhibits no distension.  Musculoskeletal: Normal range of motion.       Feet:  Neurological: He is alert and oriented to person, place, and time. No cranial nerve deficit.  Skin: Skin is warm and dry. No rash noted.  Psychiatric: He has a normal mood and affect. His behavior is normal.   Patient sent with accompanying lab from [facility]. ED Course  Procedures (including critical care time)  Seen by Dr. Cathlean Cower).  Recommends admission to hospitalist.  Will order ct angio with run-off.   Labs Review Labs Reviewed  CBC - Abnormal; Notable for the following:    WBC 12.6 (*)    RBC 3.98 (*)    Hemoglobin 11.5 (*)    HCT 33.8 (*)    RDW 16.2 (*)    All other components within normal limits  BASIC METABOLIC PANEL - Abnormal;  Notable for the following:    Sodium 131 (*)    Glucose, Bld 104 (*)    BUN 24 (*)    GFR calc non Af Amer 67 (*)    GFR calc Af Amer 78 (*)    All other components within normal limits  GLUCOSE, CAPILLARY - Abnormal; Notable for the following:    Glucose-Capillary 105 (*)    All other components within normal limits  GLUCOSE, CAPILLARY - Abnormal; Notable for the following:    Glucose-Capillary 184 (*)    All other components within normal limits  TROPONIN I - Abnormal; Notable for the following:    Troponin I 0.38 (*)    All other components within normal limits  TROPONIN I - Abnormal; Notable for the following:    Troponin I 0.49 (*)    All other components within normal limits  CBC - Abnormal; Notable for the following:    WBC 19.3 (*)    RBC 3.26 (*)    Hemoglobin 9.8 (*)    HCT 27.7 (*)    All other components within normal limits  COMPREHENSIVE METABOLIC PANEL - Abnormal; Notable for the following:    Sodium 133 (*)    Glucose, Bld 109 (*)    Calcium 8.1 (*)    Total Protein 5.5 (*)    Albumin 2.4 (*)    GFR calc non Af Amer 79 (*)    All other components within normal limits  CBC WITH DIFFERENTIAL - Abnormal; Notable for the following:    WBC 18.6 (*)    RBC 3.30 (*)    Hemoglobin 9.9 (*)    HCT 28.1 (*)    Neutrophils Relative % 87 (*)    Neutro Abs 16.2 (*)    Lymphocytes Relative 9 (*)    All other components within normal limits  GLUCOSE, CAPILLARY - Abnormal; Notable for the following:    Glucose-Capillary 179 (*)    All other components within normal limits  HEPARIN LEVEL (UNFRACTIONATED) - Abnormal; Notable for the following:    Heparin Unfractionated 0.88 (*)    All other components within normal limits  GLUCOSE, CAPILLARY - Abnormal; Notable for the following:    Glucose-Capillary 118 (*)    All other components within normal limits  GLUCOSE, CAPILLARY - Abnormal; Notable for the following:    Glucose-Capillary 125 (*)    All other  components  within normal limits  APTT - Abnormal; Notable for the following:    aPTT 120 (*)    All other components within normal limits  GLUCOSE, CAPILLARY - Abnormal; Notable for the following:    Glucose-Capillary 112 (*)    All other components within normal limits  GLUCOSE, CAPILLARY - Abnormal; Notable for the following:    Glucose-Capillary 110 (*)    All other components within normal limits  POCT I-STAT 4, (NA,K, GLUC, HGB,HCT) - Abnormal; Notable for the following:    Glucose, Bld 150 (*)    HCT 29.0 (*)    Hemoglobin 9.9 (*)    All other components within normal limits  POCT I-STAT 4, (NA,K, GLUC, HGB,HCT) - Abnormal; Notable for the following:    Glucose, Bld 168 (*)    HCT 30.0 (*)    Hemoglobin 10.2 (*)    All other components within normal limits  POCT I-STAT 4, (NA,K, GLUC, HGB,HCT) - Abnormal; Notable for the following:    Glucose, Bld 161 (*)    HCT 27.0 (*)    Hemoglobin 9.2 (*)    All other components within normal limits  POCT I-STAT 4, (NA,K, GLUC, HGB,HCT) - Abnormal; Notable for the following:    Potassium 5.4 (*)    Glucose, Bld 188 (*)    HCT 32.0 (*)    Hemoglobin 10.9 (*)    All other components within normal limits  POCT I-STAT 4, (NA,K, GLUC, HGB,HCT) - Abnormal; Notable for the following:    Glucose, Bld 190 (*)    HCT 29.0 (*)    Hemoglobin 9.9 (*)    All other components within normal limits  TROPONIN I  CK TOTAL AND CKMB  CK TOTAL AND CKMB  CK TOTAL AND CKMB  LACTIC ACID, PLASMA  PROCALCITONIN  PROCALCITONIN  URINALYSIS, ROUTINE W REFLEX MICROSCOPIC  SODIUM, URINE, RANDOM  CREATININE, URINE, RANDOM  OSMOLALITY, URINE  CORTISOL  HEPARIN LEVEL (UNFRACTIONATED)  PREPARE RBC (CROSSMATCH)  TYPE AND SCREEN  PREPARE RBC (CROSSMATCH)   Imaging Review Ct Angio Ao+bifem W/cm &/or Wo/cm  08/24/13   CLINICAL DATA:  Avascular necrosis of the left foot, left great toe is purple and red, history of diabetes and DVT  EXAM: CT ANGIOGRAPHY OF ABDOMINAL  AORTA WITH ILIOFEMORAL RUNOFF  TECHNIQUE: Multidetector CT imaging of the abdomen, pelvis and lower extremities was performed using the standard protocol during bolus administration of intravenous contrast. Multiplanar CT image reconstructions including MIPs were obtained to evaluate the vascular anatomy.  CONTRAST:  OMNIPAQUE IOHEXOL 350 MG/ML SOLN  COMPARISON:  Bilateral lower extremity venous Doppler ultrasound - 06/13/2013 ; CT abdomen pelvis - 03/18/2011  FINDINGS: Vascular Findings:  Abdominal aorta: Rather extensive primarily calcified atherosclerotic plaque within the abdominal aorta. There is mild ectasia of the infrarenal abdominal aorta measuring approximately 2.6 cm (axial image 76, series 5, sagittal image 110, series 9). No abdominal aortic dissection or periaortic stranding.  Celiac artery: The origin of the celiac artery is noted to be mildly tortuous. There is a mixture of calcified and noncalcified plaque involving the origin and proximal aspect of the celiac artery, not definitely resulting in hemodynamically significant stenosis. Conventional branching pattern.  SMA: There is a mixture of calcified and noncalcified plaque involving the origin and main trunk of the SMA, not resulting in hemodynamically significant stenosis. Incidental note is made of a replaced right hepatic artery arising from the proximal SMA.  Right Renal artery: Solitary with early bifurcation. There is a  mixture of calcified and noncalcified atherosclerotic plaque involving the origin and proximal aspect of the right renal artery, possibly approaching 50% luminal narrowing.  Left Renal artery: Solitary; there is eccentric primarily calcified atherosclerotic plaque involving the origin and proximal aspect of the left renal artery, not resulting in hemodynamically significant narrowing.  IMA: Widely patent.   ------------------------------------------------------------------------------------------------------  Right  lower extremity:  Pelvic vasculature: There is a mixture of calcified and noncalcified plaque involving the right common iliac artery, not result in hemodynamically significant narrowing. There is mixed calcified and noncalcified atherosclerotic plaque within both the right external and internal iliac arteries, not resulting in hemodynamically significant narrowing.  Right common femoral artery: Minimal amount of eccentric atherosclerotic plaque, not resulting in hemodynamically significant narrowing.  Right deep femoral artery: There is a discrete filling defect within the proximal aspect of the right deep femoral artery (images 167, 175 and 184, series 5) with short segment occlusion extending to the level of the proximal/ mid thigh. There is reconstitution of the distal aspect of the right deep femoral artery via deep thigh collaterals.  Right superficial femoral artery: There is scattered minimal mixed calcified and noncalcified atherosclerotic plaque within in the proximal and distal aspects of the right superficial femoral artery, not resulting in hemodynamically significant stenosis.  Right popliteal artery: The above knee popliteal artery is degraded due to streak artifact from the right total knee replacement. The below-knee popliteal artery appears patent throughout its imaged course.  Right lower leg: There is a apparent nonocclusive embolism within in the proximal aspect of the right posterior tibial artery (representative images 328 and 331, series 5). Though the vessel remains patent to the level of the forefoot. The right anterior tibial and peroneal arteries are patent. A right-sided dorsalis pedis artery is patent to the level of the mid foot.   ------------------------------------------------------------------------------  Left lower extremity:  Pelvic vasculature: There is mixed calcified and noncalcified plaque within the left common, external and internal iliac artery is all of which are of  normal caliber and free of hemodynamically significant narrowing.  Left common femoral artery: Occluded throughout its course with reconstitution via pelvic collateral flow.  Deep femoral artery: Widely patent throughout its course, though there does appear to be a suspected minimal amount of distal nonocclusive thrombus within the distal aspect of a anterior branch vessel of the deep femoral artery (image 208, series 5).  Left superficial femoral artery: Embolism within the left common femoral artery extends to involve the proximal aspect of the left superficial femoral artery with early reconstitution. There is a minimal amount of calcified and noncalcified atherosclerotic plaque within the distal aspect of the left common femoral artery, not resulting in hemodynamically significant stenosis.  Left popliteal artery: The above knee popliteal artery is degraded due to streak artifact from the adjacent left total knee replacement. There is nonocclusive embolism seen throughout the image course of the left below-knee popliteal artery (image 303, series 5).  Left lower leg: There is nonocclusive thrombus which extends to involve the proximal aspect of the left peroneal artery (image 325). There is short segment occlusion of the proximal aspect of the left anterior tibial artery (image 346) with early reconstitution. The left posterior tibial artery is patent to the level of the forefoot. A patent left-sided dorsalis pedis artery is not identified.  Review of the MIP images confirms the above findings.   --------------------------------------------------------------------------------  Nonvascular Findings:  Evaluation of the abdominal organs is limited to the arterial phase of enhancement.  Normal hepatic contour. No discrete hyperenhancing hepatic lesions. The gallbladder is distended but otherwise normal. No discrete radiopaque gallstones. No ascites.  There is symmetric enhancement of the bilateral kidneys. There is  an approximately 2.7 x 2.9 cm partially exophytic hypo attenuating (-8 Hounsfield unit) cyst arising from the mid aspect of the right kidney (image 69, series 5). There is an additional approximately 1.9 cm hypoattenuating (7 Hounsfield unit) left-sided renal cyst (image 79). Additional bilateral subcentimeter hypoattenuating renal lesions are too small to accurately characterize. There is a grossly unchanged approximate 1.4 x 1.5 cm nonobstructing stone within the mid aspect of the left kidney (coronal image 116, series 7). No urinary obstruction. Grossly symmetric likely age-related perinephric stranding. No stones are seen along the expected course of either ureter or within the urinary bladder. Normal appearance of the urinary bladder given the. Distention. Normal appearance of the bilateral adrenal glands and pancreas. Likely normal early arterial phase appearance of the spleen with associated grossly unchanged cleft within its mid aspect. Incidental note is made of a prominent splenule within the splenic hilum. No perisplenic stranding.  Scattered colonic diverticulosis without evidence of diverticulitis. Postsurgical change of the cecum. The appendix is not visualized, presumably surgically absent. No evidence of enteric obstruction. No pneumoperitoneum, pneumatosis or portal venous gas.  No retroperitoneal, mesenteric, pelvic or inguinal lymphadenopathy.  Limited visualization a lower thorax demonstrates minimal subsegmental atelectasis within the imaged left lower lobe. No focal airspace opacities. No pleural effusion.  Cardiomegaly. Post median sternotomy and aortic valve replacement. No pericardial effusion.  No acute or aggressive osseous lesions. Stable sequela of L4-L5 ACDF with associated right-sided paraspinal fusion at this level. Bilateral facet degenerative change within the lower lumbar spine. Post bilateral total knee replacements including right-sided redo total knee replacement.  IMPRESSION:  1. Overall findings worrisome for embolic phenomenon involving left common femoral and left below-knee popliteal arteries with extension to involve primarily the left peroneal artery as well as the right deep femoral artery and right posterior tibial arteries. As a discrete source of this distal embolism is not depicted on this examination (in particular, no evidence of significant abdominal aortic aneurysm), further evaluation with cardiac echo (especially in light of known aortic valve replacement) may be performed as clinically indicated. 2. Rather extensive atherosclerotic plaque within a mildly ectatic abdominal aorta which measures approximately 2.6 cm in maximum diameter. 3. Right-sided nonobstructing nephrolithiasis. Critical Value/emergent results were called by telephone at the time of interpretation on 08/21/2013 at 10:26 PM to Dr.Evart Mcdonnell , who verbally acknowledged these results.   Electronically Signed   By: Simonne Come M.D.   On: 08/10/2013 22:29   Dg Chest Port 1 View  08/18/2013   CLINICAL DATA:  Dyspnea  EXAM: PORTABLE CHEST - 1 VIEW  COMPARISON:  08/05/2013  FINDINGS: Changes from cardiac surgery are stable. No pneumothorax. No pleural effusion.  No lung consolidation or pulmonary edema.  Right internal jugular central venous line is stable and well positioned.  IMPRESSION: No acute cardiopulmonary disease. No change from the previous day's study.   Electronically Signed   By: Amie Portland M.D.   On: 08/18/2013 07:57   Dg Chest Port 1 View  08/17/2013   CLINICAL DATA:  Central line placement, leukocytosis  EXAM: PORTABLE CHEST - 1 VIEW  COMPARISON:  08/06/2013  FINDINGS: Grossly unchanged enlarged cardiac silhouette and mediastinal contours post median sternotomy and CABG. Interval placement of right jugular approach central venous catheter with tip projected over the superior cavoatrial  junction. No pneumothorax. Mild pulmonary venous congestion without frank evidence of edema.  Minimal left basilar/retrocardiac atelectasis. No pleural effusion. Unchanged bones.  IMPRESSION: 1. Right jugular approach central venous catheter tip projects over the superior cavoatrial junction. No pneumothorax. 2. Pulmonary venous congestion without frank evidence of edema. 3. Minimal left basilar atelectasis.   Electronically Signed   By: Simonne Come M.D.   On: 08/11/2013 17:11    EKG Interpretation     Ventricular Rate:    PR Interval:    QRS Duration:   QT Interval:    QTC Calculation:   R Axis:     Text Interpretation:              MDM   1. Critical lower limb ischemia   2. Atrial flutter   3. H/O aortic valve replacement with porcine valve   4. OSA on CPAP   5. S/P CABG (coronary artery bypass graft)   6. Acute blood loss anemia   7. Hemorrhagic shock   8. CAD in native artery   9. DVT (deep venous thrombosis), unspecified laterality   10. Paroxysmal atrial flutter        Nelia Shi, MD 08/18/13 1357

## 2013-08-16 NOTE — H&P (Signed)
[facility] History and Physical  DABID GODOWN ZOX:[id number] DOB: May 19, 1935 DOA: Aug 26, 2013  Referring physician: ED PCP: Ignatius Specking., MD  Chief Complaint: L great toe discoloration  HPI: [name] [name] is a 78 y.o. male who presents initially to [facility] with L great toe discoloration.  After pulses were not detectable in that foot and foot was noted to be obviously mottled with dusky appearance of toe patient was transferred to [facility] for critical lower limb ischemia and vascular surgery eval (please see their note in the patients chart).  Further history taking from the family reveals that the patients symptoms onset after a toe injury 2 weeks ago, and have been steadily worsening in that time frame.  Because of the worsening in color of his toe, he decided to go to the hospital earlier this evening.  Patient has a known history of "bad circulation".  He is on Xarelto chronically.  Review of Systems: 12 systems reviewed and otherwise negative.  Past Medical History  Diagnosis Date  . Coronary artery disease   . Hypertension   . Hyperlipidemia   . Heart murmur   . Diabetes mellitus without complication   . GERD (gastroesophageal reflux disease)   . History of kidney stones   . History of gout   . Sleep apnea     USES C-PAP  . Cyst of eye     X3 CYSTS in left eyeHX OF LOSS OF VISION - RESOLVED AT PRESENT  . Pneumonia     hx of  . Arthritis   . Still's disease 1997    in remission  . Complication of anesthesia     can not pee after surgery   Past Surgical History  Procedure Laterality Date  . Joint replacement  1996/2005    bil  . Lung mass  1965    benign mass removed  . Colon resection      benign mass  . Hiatal hernia repair  2000  . Aortic valve replacement  2009  . Back surgery  2011  . Coronary artery bypass graft  2000    3 VESSELLS  . Lithotripsy    . Tonsillectomy  at 78 years old  . Hammer toe surgery Right     x2  . Bunionectomy Left    . Knee arthroscopy Right   . Total knee revision Right 04/10/2013    Procedure: RIGHT TOTAL KNEE REVISION;  Surgeon: Loanne Drilling, MD;  Location: [facility] ORS;  Service: Orthopedics;  Laterality: Right;  . Cardioversion N/A 04/12/2013    Procedure: CARDIOVERSION;  Surgeon: Chrystie Nose, MD;  Location: [facility] ENDOSCOPY;  Service: Cardiovascular;  Laterality: N/A;   Social History:  reports that he quit smoking about 51 years ago. His smoking use included Cigarettes. He smoked 0.00 packs per day for 5 years. He has never used smokeless tobacco. He reports that he drinks alcohol. He reports that he does not use illicit drugs.   Allergies  Allergen Reactions  . Bee Venom     History reviewed. No pertinent family history.  Prior to Admission medications   Medication Sig Start Date End Date Taking? Authorizing Provider  allopurinol (ZYLOPRIM) 100 MG tablet Take 100 mg by mouth daily.   Yes Historical Provider, MD  aspirin EC 81 MG tablet Take 162 mg by mouth daily.   Yes Historical Provider, MD  atomoxetine (STRATTERA) 10 MG capsule Take 10 mg by mouth every evening.   Yes Historical Provider, MD  donepezil (ARICEPT)  10 MG tablet Take 10 mg by mouth at bedtime.   Yes Historical Provider, MD  FLUoxetine (PROZAC) 20 MG capsule Take 20 mg by mouth daily.   Yes Historical Provider, MD  LORazepam (ATIVAN) 1 MG tablet Take 1 mg by mouth at bedtime.   Yes Historical Provider, MD  metFORMIN (GLUCOPHAGE) 500 MG tablet Take 500 mg by mouth daily with breakfast.   Yes Historical Provider, MD  pantoprazole (PROTONIX) 40 MG tablet Take 40 mg by mouth daily.   Yes Historical Provider, MD  potassium chloride SA (K-DUR,KLOR-CON) 20 MEQ tablet Take 20 mEq by mouth daily.   Yes Historical Provider, MD  predniSONE (DELTASONE) 10 MG tablet Take 10 mg by mouth daily with breakfast.   Yes Historical Provider, MD  ramipril (ALTACE) 10 MG capsule Take 10 mg by mouth daily.   Yes Historical Provider, MD  B Complex-C  (B-COMPLEX WITH VITAMIN C) tablet Take 1 tablet by mouth daily.    Historical Provider, MD  methocarbamol (ROBAXIN) 500 MG tablet Take 1 tablet (500 mg total) by mouth every 6 (six) hours as needed. 04/15/13   Alexzandrew Perkins, PA-C  metoprolol (LOPRESSOR) 50 MG tablet Take 75 mg by mouth 2 (two) times daily.     Historical Provider, MD  Multiple Vitamin (MULTIVITAMIN WITH MINERALS) TABS Take 1 tablet by mouth daily.    Historical Provider, MD  omega-3 acid ethyl esters (LOVAZA) 1 G capsule Take 1 g by mouth daily.    Historical Provider, MD  oxyCODONE (OXY IR/ROXICODONE) 5 MG immediate release tablet Take 1-2 tablets (5-10 mg total) by mouth every 3 (three) hours as needed. 04/15/13   Alexzandrew Julien Girt, PA-C  Rivaroxaban (XARELTO) 20 MG TABS Take 1 tablet (20 mg total) by mouth daily with breakfast. 04/15/13   Alexzandrew Julien Girt, PA-C  simvastatin (ZOCOR) 20 MG tablet Take 20 mg by mouth every evening.    Historical Provider, MD  Tamsulosin HCl (FLOMAX) 0.4 MG CAPS Take 0.4 mg by mouth daily.    Historical Provider, MD  temazepam (RESTORIL) 30 MG capsule Take 30 mg by mouth at bedtime as needed for sleep.    Historical Provider, MD  traMADol (ULTRAM) 50 MG tablet Take 50 mg by mouth 2 (two) times daily.    Historical Provider, MD  vitamin C (ASCORBIC ACID) 500 MG tablet Take 500 mg by mouth daily.    Historical Provider, MD  vitamin E 1000 UNIT capsule Take 1,000 Units by mouth daily.    Historical Provider, MD   Physical Exam: Filed Vitals:   August 18, 2013 1934  BP: 114/78  Pulse: 85  Temp: 97.5 F (36.4 C)  Resp: 14     General:  NAD, resting comfortably in bed  Eyes: PEERLA EOMI  ENT: mucous membranes moist  Neck: supple w/o JVD  Cardiovascular: RRR w/o MRG  Respiratory: CTA B  Abdomen: soft, nt, nd, bs+  Skin: L great toe with ecchymosis and small area of black eschar, petechiae on dorsum of left foot, no ascending erythema.  Musculoskeletal: MAE, full ROM all 4  extremities  Psychiatric: normal tone and affect  Neurologic: AAOx3, grossly non-focal   Labs on Admission:  Basic Metabolic Panel: No results found for this basename: NA, K, CL, CO2, GLUCOSE, BUN, CREATININE, CALCIUM, MG, PHOS,  in the last 168 hours Liver Function Tests: No results found for this basename: AST, ALT, ALKPHOS, BILITOT, PROT, ALBUMIN,  in the last 168 hours No results found for this basename: LIPASE, AMYLASE,  in the  last 168 hours No results found for this basename: AMMONIA,  in the last 168 hours CBC: No results found for this basename: WBC, NEUTROABS, HGB, HCT, MCV, PLT,  in the last 168 hours Cardiac Enzymes: No results found for this basename: CKTOTAL, CKMB, CKMBINDEX, TROPONINI,  in the last 168 hours  BNP (last 3 results) No results found for this basename: PROBNP,  in the last 8760 hours CBG: No results found for this basename: GLUCAP,  in the last 168 hours  Radiological Exams on Admission: No results found.  EKG: Independently reviewed.  Assessment/Plan Principal Problem:   Critical lower limb ischemia   1. Critical lower limb ischemia - vascular surgery is managing, Dr. Imogene Burn has asked that the patient be kept NPO, that we hold the Xarelto, and that we not yet order any heparin at this time although this may be ordered in the morning after his xarelto is subtheraputic.  A CT aortogram is pending, Dr. Imogene Burn will review and states he will make the decision regarding need for OR based off of this. 2. Chronic medical conditions: 1. DM2 - holding metformin and putting patient on q4h SSI while NPO 2. HTN - continuing all home HTN meds 3. H/o DVT - holding Xeralto at vascular surgery request in case he wants to take patient to OR. 4. H/o A.flutter - tele monitor and continue rate control meds    Code Status: Full (must indicate code status--if unknown or must be presumed, indicate so) Family Communication: Family at bedside (indicate person spoken with,  if applicable, with phone number if by telephone) Disposition Plan: Admit to inpatient (indicate anticipated LOS)  Time spent: 70 min  Jacqualynn Parco M. [facility] Pager [phone]  If 7PM-7AM, please contact night-coverage [email or web address] Password [facility] 08/28/2013, 9:38 PM

## 2013-08-16 NOTE — ED Notes (Signed)
Pt here via [facility] from [facility] hospital with vascular necrosis to left foot.  Left great toe is purple and red.  Pt has hx of diabetes and dvts.

## 2013-08-17 ENCOUNTER — Encounter (HOSPITAL_COMMUNITY): Admission: EM | Disposition: E | Payer: Self-pay | Source: Home / Self Care | Attending: Internal Medicine

## 2013-08-17 ENCOUNTER — Inpatient Hospital Stay (HOSPITAL_COMMUNITY): Payer: Medicare Other | Admitting: Anesthesiology

## 2013-08-17 ENCOUNTER — Inpatient Hospital Stay (HOSPITAL_COMMUNITY): Payer: Medicare Other

## 2013-08-17 ENCOUNTER — Encounter (HOSPITAL_COMMUNITY): Payer: Medicare Other | Admitting: Anesthesiology

## 2013-08-17 DIAGNOSIS — Z952 Presence of prosthetic heart valve: Secondary | ICD-10-CM

## 2013-08-17 DIAGNOSIS — R578 Other shock: Secondary | ICD-10-CM

## 2013-08-17 DIAGNOSIS — Z951 Presence of aortocoronary bypass graft: Secondary | ICD-10-CM

## 2013-08-17 DIAGNOSIS — D62 Acute posthemorrhagic anemia: Secondary | ICD-10-CM | POA: Diagnosis not present

## 2013-08-17 DIAGNOSIS — G4733 Obstructive sleep apnea (adult) (pediatric): Secondary | ICD-10-CM

## 2013-08-17 HISTORY — PX: THROMBECTOMY FEMORAL ARTERY: SHX6406

## 2013-08-17 HISTORY — PX: ENDARTERECTOMY FEMORAL: SHX5804

## 2013-08-17 HISTORY — PX: INTRAOPERATIVE ARTERIOGRAM: SHX5157

## 2013-08-17 LAB — BASIC METABOLIC PANEL
CO2: 22 mEq/L (ref 19–32)
Calcium: 8.9 mg/dL (ref 8.4–10.5)
Chloride: 97 mEq/L (ref 96–112)
Creatinine, Ser: 1.05 mg/dL (ref 0.50–1.35)
Potassium: 4.5 mEq/L (ref 3.5–5.1)
Sodium: 131 mEq/L — ABNORMAL LOW (ref 135–145)

## 2013-08-17 LAB — CBC WITH DIFFERENTIAL/PLATELET
Basophils Absolute: 0 10*3/uL (ref 0.0–0.1)
Basophils Relative: 0 % (ref 0–1)
Eosinophils Absolute: 0 10*3/uL (ref 0.0–0.7)
Eosinophils Relative: 0 % (ref 0–5)
HCT: 28.1 % — ABNORMAL LOW (ref 39.0–52.0)
Hemoglobin: 9.9 g/dL — ABNORMAL LOW (ref 13.0–17.0)
Lymphs Abs: 1.6 10*3/uL (ref 0.7–4.0)
MCH: 30 pg (ref 26.0–34.0)
MCHC: 35.2 g/dL (ref 30.0–36.0)
MCV: 85.2 fL (ref 78.0–100.0)
Monocytes Absolute: 0.8 10*3/uL (ref 0.1–1.0)
RBC: 3.3 MIL/uL — ABNORMAL LOW (ref 4.22–5.81)
RDW: 14.6 % (ref 11.5–15.5)

## 2013-08-17 LAB — CK TOTAL AND CKMB (NOT AT ARMC)
CK, MB: 2.1 ng/mL (ref 0.3–4.0)
Relative Index: INVALID (ref 0.0–2.5)
Total CK: 14 U/L (ref 7–232)

## 2013-08-17 LAB — CBC
Hemoglobin: 11.5 g/dL — ABNORMAL LOW (ref 13.0–17.0)
MCH: 28.9 pg (ref 26.0–34.0)
MCHC: 34 g/dL (ref 30.0–36.0)
Platelets: 277 10*3/uL (ref 150–400)
RBC: 3.98 MIL/uL — ABNORMAL LOW (ref 4.22–5.81)
RDW: 16.2 % — ABNORMAL HIGH (ref 11.5–15.5)

## 2013-08-17 LAB — POCT I-STAT 4, (NA,K, GLUC, HGB,HCT)
Glucose, Bld: 150 mg/dL — ABNORMAL HIGH (ref 70–99)
Glucose, Bld: 168 mg/dL — ABNORMAL HIGH (ref 70–99)
Glucose, Bld: 161 mg/dL — ABNORMAL HIGH (ref 70–99)
Glucose, Bld: 188 mg/dL — ABNORMAL HIGH (ref 70–99)
Glucose, Bld: 190 mg/dL — ABNORMAL HIGH (ref 70–99)
HCT: 30 % — ABNORMAL LOW (ref 39.0–52.0)
HCT: 32 % — ABNORMAL LOW (ref 39.0–52.0)
Hemoglobin: 9.9 g/dL — ABNORMAL LOW (ref 13.0–17.0)
Hemoglobin: 10.2 g/dL — ABNORMAL LOW (ref 13.0–17.0)
Hemoglobin: 10.9 g/dL — ABNORMAL LOW (ref 13.0–17.0)
Hemoglobin: 9.2 g/dL — ABNORMAL LOW (ref 13.0–17.0)
Potassium: 4.4 mEq/L (ref 3.5–5.1)
Potassium: 4.7 mEq/L (ref 3.5–5.1)
Potassium: 4.7 mEq/L (ref 3.5–5.1)
Sodium: 135 mEq/L (ref 135–145)

## 2013-08-17 LAB — COMPREHENSIVE METABOLIC PANEL
Albumin: 2.2 g/dL — ABNORMAL LOW (ref 3.5–5.2)
Alkaline Phosphatase: 43 U/L (ref 39–117)
BUN: 17 mg/dL (ref 6–23)
Calcium: 7.9 mg/dL — ABNORMAL LOW (ref 8.4–10.5)
GFR calc non Af Amer: 82 mL/min — ABNORMAL LOW (ref >90)
Glucose, Bld: 147 mg/dL — ABNORMAL HIGH (ref 70–99)
Potassium: 4.3 mEq/L (ref 3.5–5.1)
Sodium: 134 mEq/L — ABNORMAL LOW (ref 135–145)
Total Protein: 5 g/dL — ABNORMAL LOW (ref 6.0–8.3)

## 2013-08-17 LAB — GLUCOSE, CAPILLARY
Glucose-Capillary: 105 mg/dL — ABNORMAL HIGH (ref 70–99)
Glucose-Capillary: 179 mg/dL — ABNORMAL HIGH (ref 70–99)

## 2013-08-17 LAB — PREPARE RBC (CROSSMATCH)

## 2013-08-17 LAB — TROPONIN I
Troponin I: <0.3 ng/mL (ref <0.30)
Troponin I: 0.38 ng/mL (ref <0.30)

## 2013-08-17 SURGERY — THROMBECTOMY, ARTERY, FEMORAL
Anesthesia: General | Site: Leg Upper | Laterality: Left | Wound class: Clean

## 2013-08-17 MED ORDER — ATORVASTATIN CALCIUM 10 MG PO TABS
10.0000 mg | ORAL_TABLET | Freq: Every day | ORAL | Status: DC
  Administered 2013-08-19 – 2013-08-22 (×4): 10 mg via ORAL
  Filled 2013-08-17 (×10): qty 1

## 2013-08-17 MED ORDER — TORSEMIDE 20 MG PO TABS
20.0000 mg | ORAL_TABLET | Freq: Every day | ORAL | Status: DC
  Filled 2013-08-17 (×2): qty 1

## 2013-08-17 MED ORDER — GUAIFENESIN-DM 100-10 MG/5ML PO SYRP: 15.0000 mL | ORAL_SOLUTION | ORAL | Status: DC | PRN

## 2013-08-17 MED ORDER — DOCUSATE SODIUM 100 MG PO CAPS
100.0000 mg | ORAL_CAPSULE | Freq: Every day | ORAL | Status: DC
  Filled 2013-08-17: qty 1

## 2013-08-17 MED ORDER — ALUM & MAG HYDROXIDE-SIMETH 200-200-20 MG/5ML PO SUSP: 15.0000 mL | ORAL | Status: DC | PRN

## 2013-08-17 MED ORDER — DEXTROSE 5 % IV SOLN
30.0000 ug/min | INTRAVENOUS | Status: DC
  Administered 2013-08-17: 30 ug/min via INTRAVENOUS
  Filled 2013-08-17: qty 1

## 2013-08-17 MED ORDER — ACETAMINOPHEN 325 MG PO TABS: 325.0000 mg | ORAL_TABLET | ORAL | Status: DC | PRN

## 2013-08-17 MED ORDER — THROMBIN 20000 UNITS EX SOLR
CUTANEOUS | Status: DC | PRN
  Administered 2013-08-17: 13:00:00 via TOPICAL

## 2013-08-17 MED ORDER — POTASSIUM CHLORIDE CRYS ER 20 MEQ PO TBCR: 20.0000 meq | EXTENDED_RELEASE_TABLET | Freq: Once | ORAL | Status: AC | PRN

## 2013-08-17 MED ORDER — FUROSEMIDE 10 MG/ML IJ SOLN: 20.0000 mg | Freq: Once | INTRAMUSCULAR | Status: DC

## 2013-08-17 MED ORDER — FUROSEMIDE 10 MG/ML IJ SOLN
20.0000 mg | Freq: Once | INTRAMUSCULAR | Status: AC
  Administered 2013-08-17: 20 mg via INTRAVENOUS

## 2013-08-17 MED ORDER — METOPROLOL TARTRATE 1 MG/ML IV SOLN
INTRAVENOUS | Status: DC | PRN
  Administered 2013-08-17 (×5): 1 mg via INTRAVENOUS

## 2013-08-17 MED ORDER — IOHEXOL 300 MG/ML  SOLN
INTRAMUSCULAR | Status: DC | PRN
  Administered 2013-08-17: 50 mL via INTRAVENOUS

## 2013-08-17 MED ORDER — DOPAMINE-DEXTROSE 3.2-5 MG/ML-% IV SOLN
3.0000 ug/kg/min | INTRAVENOUS | Status: DC
  Administered 2013-08-17: 3 ug/kg/min via INTRAVENOUS

## 2013-08-17 MED ORDER — HYDROCORTISONE SOD SUCCINATE 100 MG IJ SOLR
INTRAMUSCULAR | Status: DC | PRN
  Administered 2013-08-17: 100 mg via INTRAVENOUS

## 2013-08-17 MED ORDER — PROPOFOL 10 MG/ML IV BOLUS
INTRAVENOUS | Status: DC | PRN
  Administered 2013-08-17: 70 mg via INTRAVENOUS

## 2013-08-17 MED ORDER — LACTATED RINGERS IV SOLN
INTRAVENOUS | Status: DC | PRN
  Administered 2013-08-17 (×3): via INTRAVENOUS

## 2013-08-17 MED ORDER — PHENYLEPHRINE HCL 10 MG/ML IJ SOLN
10.0000 mg | INTRAVENOUS | Status: DC | PRN
  Administered 2013-08-17: 25 ug/min via INTRAVENOUS

## 2013-08-17 MED ORDER — HYDRALAZINE HCL 20 MG/ML IJ SOLN: 10.0000 mg | INTRAMUSCULAR | Status: DC | PRN

## 2013-08-17 MED ORDER — FUROSEMIDE 10 MG/ML IJ SOLN
INTRAMUSCULAR | Status: AC
  Administered 2013-08-17: 20 mg via INTRAVENOUS
  Filled 2013-08-17: qty 4

## 2013-08-17 MED ORDER — PROTAMINE SULFATE 10 MG/ML IV SOLN
INTRAVENOUS | Status: DC | PRN
  Administered 2013-08-17: 50 mg via INTRAVENOUS

## 2013-08-17 MED ORDER — MORPHINE SULFATE 2 MG/ML IJ SOLN
2.0000 mg | INTRAMUSCULAR | Status: DC | PRN
Start: 2013-08-17 — End: 2013-08-18
  Administered 2013-08-18: 2 mg via INTRAVENOUS
  Filled 2013-08-17 (×2): qty 1

## 2013-08-17 MED ORDER — DEXTROSE 5 % IV SOLN
0.2500 ug/kg/min | INTRAVENOUS | Status: DC
  Filled 2013-08-17: qty 2

## 2013-08-17 MED ORDER — NEOSTIGMINE METHYLSULFATE 1 MG/ML IJ SOLN
INTRAMUSCULAR | Status: DC | PRN
  Administered 2013-08-17: 5 mg via INTRAVENOUS

## 2013-08-17 MED ORDER — DEXTROSE 5 % IV SOLN
1.5000 g | INTRAVENOUS | Status: DC | PRN
  Administered 2013-08-17: 1.5 g via INTRAVENOUS

## 2013-08-17 MED ORDER — ACETAMINOPHEN 650 MG RE SUPP: 325.0000 mg | RECTAL | Status: DC | PRN

## 2013-08-17 MED ORDER — THROMBIN 20000 UNITS EX SOLR
CUTANEOUS | Status: AC
  Filled 2013-08-17: qty 20000

## 2013-08-17 MED ORDER — SODIUM CHLORIDE 0.9 % IV SOLN
500.0000 mL | Freq: Once | INTRAVENOUS | Status: AC | PRN
  Administered 2013-08-17: 500 mL via INTRAVENOUS

## 2013-08-17 MED ORDER — GLYCOPYRROLATE 0.2 MG/ML IJ SOLN
INTRAMUSCULAR | Status: DC | PRN
  Administered 2013-08-17: 0.6 mg via INTRAVENOUS

## 2013-08-17 MED ORDER — ROCURONIUM BROMIDE 100 MG/10ML IV SOLN
INTRAVENOUS | Status: DC | PRN
  Administered 2013-08-17: 10 mg via INTRAVENOUS
  Administered 2013-08-17: 40 mg via INTRAVENOUS
  Administered 2013-08-17: 10 mg via INTRAVENOUS

## 2013-08-17 MED ORDER — FENTANYL CITRATE 0.05 MG/ML IJ SOLN
INTRAMUSCULAR | Status: DC | PRN
  Administered 2013-08-17: 50 ug via INTRAVENOUS
  Administered 2013-08-17: 100 ug via INTRAVENOUS
  Administered 2013-08-17: 50 ug via INTRAVENOUS
  Administered 2013-08-17: 100 ug via INTRAVENOUS
  Administered 2013-08-17: 50 ug via INTRAVENOUS

## 2013-08-17 MED ORDER — LIDOCAINE HCL (CARDIAC) 20 MG/ML IV SOLN
INTRAVENOUS | Status: DC | PRN
  Administered 2013-08-17: 40 mg via INTRAVENOUS

## 2013-08-17 MED ORDER — PANTOPRAZOLE SODIUM 40 MG PO TBEC
40.0000 mg | DELAYED_RELEASE_TABLET | Freq: Every day | ORAL | Status: DC
  Filled 2013-08-17: qty 1

## 2013-08-17 MED ORDER — SODIUM CHLORIDE 0.9 % IR SOLN
Status: DC | PRN
  Administered 2013-08-17: 10:00:00

## 2013-08-17 MED ORDER — CEFAZOLIN SODIUM-DEXTROSE 2-3 GM-% IV SOLR
INTRAVENOUS | Status: AC
  Filled 2013-08-17: qty 50

## 2013-08-17 MED ORDER — METOPROLOL TARTRATE 1 MG/ML IV SOLN: 2.0000 mg | INTRAVENOUS | Status: DC | PRN

## 2013-08-17 MED ORDER — OXYCODONE-ACETAMINOPHEN 5-325 MG PO TABS
1.0000 | ORAL_TABLET | ORAL | Status: DC | PRN
Start: 2013-08-17 — End: 2013-08-26
  Administered 2013-08-20 – 2013-08-24 (×4): 2 via ORAL
  Filled 2013-08-17 (×4): qty 2

## 2013-08-17 MED ORDER — SODIUM CHLORIDE 0.9 % IV BOLUS (SEPSIS)
500.0000 mL | Freq: Once | INTRAVENOUS | Status: AC
  Administered 2013-08-18: 500 mL via INTRAVENOUS

## 2013-08-17 MED ORDER — SODIUM CHLORIDE 0.9 % IV SOLN
INTRAVENOUS | Status: DC
  Administered 2013-08-17: 50 mL/h via INTRAVENOUS
  Administered 2013-08-21: via INTRAVENOUS
  Administered 2013-08-22: 75 mL/h via INTRAVENOUS
  Administered 2013-08-23: 10 mL/h via INTRAVENOUS

## 2013-08-17 MED ORDER — FENTANYL CITRATE 0.05 MG/ML IJ SOLN: 25.0000 ug | INTRAMUSCULAR | Status: DC | PRN

## 2013-08-17 MED ORDER — MAGNESIUM SULFATE 40 MG/ML IJ SOLN: 2.0000 g | Freq: Once | INTRAMUSCULAR | Status: AC | PRN

## 2013-08-17 MED ORDER — LACTATED RINGERS IV SOLN
INTRAVENOUS | Status: DC | PRN
  Administered 2013-08-17 (×3): via INTRAVENOUS

## 2013-08-17 MED ORDER — MIDAZOLAM HCL 5 MG/5ML IJ SOLN
INTRAMUSCULAR | Status: DC | PRN
  Administered 2013-08-17 (×2): 1 mg via INTRAVENOUS

## 2013-08-17 MED ORDER — DEXTROSE 5 % IV SOLN
1.5000 g | Freq: Two times a day (BID) | INTRAVENOUS | Status: AC
  Administered 2013-08-17 – 2013-08-18 (×2): 1.5 g via INTRAVENOUS
  Filled 2013-08-17 (×2): qty 1.5

## 2013-08-17 MED ORDER — PANTOPRAZOLE SODIUM 40 MG IV SOLR: 40.0000 mg | INTRAVENOUS | Status: DC

## 2013-08-17 MED ORDER — ALBUMIN HUMAN 5 % IV SOLN
INTRAVENOUS | Status: DC | PRN
  Administered 2013-08-17 (×2): via INTRAVENOUS

## 2013-08-17 MED ORDER — DOPAMINE-DEXTROSE 3.2-5 MG/ML-% IV SOLN
INTRAVENOUS | Status: AC
  Filled 2013-08-17: qty 250

## 2013-08-17 MED ORDER — HEPARIN SODIUM (PORCINE) 1000 UNIT/ML IJ SOLN
INTRAMUSCULAR | Status: DC | PRN
  Administered 2013-08-17: 3 mL via INTRAVENOUS
  Administered 2013-08-17: 2 mL via INTRAVENOUS
  Administered 2013-08-17: 4 mL via INTRAVENOUS
  Administered 2013-08-17: 7 mL via INTRAVENOUS

## 2013-08-17 MED ORDER — SODIUM CHLORIDE 0.9 % IV SOLN
INTRAVENOUS | Status: DC
  Administered 2013-08-17: 75 mL/h via INTRAVENOUS
  Administered 2013-08-19 – 2013-08-20 (×3): via INTRAVENOUS

## 2013-08-17 MED ORDER — ALBUMIN HUMAN 5 % IV SOLN
INTRAVENOUS | Status: AC
  Administered 2013-08-17: 12.5 g via INTRAVENOUS
  Filled 2013-08-17: qty 250

## 2013-08-17 MED ORDER — ONDANSETRON HCL 4 MG/2ML IJ SOLN
4.0000 mg | Freq: Four times a day (QID) | INTRAMUSCULAR | Status: DC | PRN
  Administered 2013-08-21 – 2013-08-22 (×2): 4 mg via INTRAVENOUS
  Filled 2013-08-17 (×2): qty 2

## 2013-08-17 MED ORDER — METOPROLOL TARTRATE 1 MG/ML IV SOLN: 5.0000 mg | Freq: Four times a day (QID) | INTRAVENOUS | Status: DC

## 2013-08-17 MED ORDER — ALBUMIN HUMAN 5 % IV SOLN
12.5000 g | Freq: Once | INTRAVENOUS | Status: DC
  Administered 2013-08-17: 12.5 g via INTRAVENOUS

## 2013-08-17 MED ORDER — HEPARIN (PORCINE) IN NACL 100-0.45 UNIT/ML-% IJ SOLN
1100.0000 [IU]/h | INTRAMUSCULAR | Status: DC
  Administered 2013-08-17: 1300 [IU]/h via INTRAVENOUS
  Administered 2013-08-19: 1100 [IU]/h via INTRAVENOUS
  Filled 2013-08-17 (×5): qty 250

## 2013-08-17 MED ORDER — LABETALOL HCL 5 MG/ML IV SOLN: 10.0000 mg | INTRAVENOUS | Status: DC | PRN

## 2013-08-17 MED ORDER — ONDANSETRON HCL 4 MG/2ML IJ SOLN
INTRAMUSCULAR | Status: DC | PRN
  Administered 2013-08-17: 4 mg via INTRAVENOUS

## 2013-08-17 MED ORDER — THROMBIN 20000 UNITS EX SOLR
CUTANEOUS | Status: DC | PRN
  Administered 2013-08-17: 10:00:00 via TOPICAL

## 2013-08-17 MED ORDER — SENNOSIDES-DOCUSATE SODIUM 8.6-50 MG PO TABS
1.0000 | ORAL_TABLET | Freq: Every evening | ORAL | Status: DC | PRN
  Filled 2013-08-17: qty 1

## 2013-08-17 MED ORDER — PHENYLEPHRINE HCL 10 MG/ML IJ SOLN
30.0000 ug/min | INTRAVENOUS | Status: DC
  Administered 2013-08-17: 35 ug/min via INTRAVENOUS
  Administered 2013-08-19: 30 ug/min via INTRAVENOUS
  Filled 2013-08-17 (×4): qty 4

## 2013-08-17 MED ORDER — PHENYLEPHRINE HCL 10 MG/ML IJ SOLN: 30.0000 ug/min | INTRAVENOUS | Status: DC

## 2013-08-17 MED ORDER — NITROGLYCERIN IN D5W 200-5 MCG/ML-% IV SOLN
2.0000 ug/min | INTRAVENOUS | Status: DC
  Filled 2013-08-17: qty 250

## 2013-08-17 MED ORDER — METOPROLOL TARTRATE 50 MG PO TABS
50.0000 mg | ORAL_TABLET | Freq: Two times a day (BID) | ORAL | Status: DC
  Filled 2013-08-17: qty 1

## 2013-08-17 MED ORDER — BISACODYL 10 MG RE SUPP
10.0000 mg | Freq: Every day | RECTAL | Status: DC | PRN
  Administered 2013-08-22: 10 mg via RECTAL
  Filled 2013-08-17: qty 1

## 2013-08-17 MED ORDER — PHENOL 1.4 % MT LIQD
1.0000 | OROMUCOSAL | Status: DC | PRN
  Administered 2013-08-18 – 2013-08-24 (×2): 1 via OROMUCOSAL
  Filled 2013-08-17: qty 177

## 2013-08-17 MED ORDER — DROPERIDOL 2.5 MG/ML IJ SOLN: 0.6250 mg | INTRAMUSCULAR | Status: DC | PRN

## 2013-08-17 MED ORDER — 0.9 % SODIUM CHLORIDE (POUR BTL) OPTIME
TOPICAL | Status: DC | PRN
  Administered 2013-08-17: 3000 mL

## 2013-08-17 MED ORDER — NITROGLYCERIN IN D5W 200-5 MCG/ML-% IV SOLN
3.0000 ug/min | INTRAVENOUS | Status: DC
  Filled 2013-08-17: qty 250

## 2013-08-17 SURGICAL SUPPLY — 80 items
BANDAGE ELASTIC 4 VELCRO ST LF (GAUZE/BANDAGES/DRESSINGS) IMPLANT
BANDAGE ESMARK 6X9 LF (GAUZE/BANDAGES/DRESSINGS) IMPLANT
BNDG CMPR 9X6 STRL LF SNTH (GAUZE/BANDAGES/DRESSINGS)
BNDG ESMARK 6X9 LF (GAUZE/BANDAGES/DRESSINGS)
CANISTER SUCTION 2500CC (MISCELLANEOUS) ×2 IMPLANT
CATH EMB 3FR 80CM (CATHETERS) ×1 IMPLANT
CATH EMB 4FR 80CM (CATHETERS) ×1 IMPLANT
CATH EMB 5FR 80CM (CATHETERS) ×1 IMPLANT
CLIP TI MEDIUM 24 (CLIP) ×2 IMPLANT
CLIP TI WIDE RED SMALL 24 (CLIP) ×2 IMPLANT
CONT SPEC STER OR (MISCELLANEOUS) ×2 IMPLANT
COVER PROBE W GEL 5X96 (DRAPES) ×2 IMPLANT
COVER SURGICAL LIGHT HANDLE (MISCELLANEOUS) ×2 IMPLANT
CUFF TOURNIQUET SINGLE 24IN (TOURNIQUET CUFF) IMPLANT
CUFF TOURNIQUET SINGLE 34IN LL (TOURNIQUET CUFF) IMPLANT
CUFF TOURNIQUET SINGLE 44IN (TOURNIQUET CUFF) IMPLANT
DRAIN CHANNEL 15F RND FF W/TCR (WOUND CARE) ×1 IMPLANT
DRAPE C-ARM 42X72 X-RAY (DRAPES) ×2 IMPLANT
DRAPE WARM FLUID 44X44 (DRAPE) ×2 IMPLANT
DRSG COVADERM 4X10 (GAUZE/BANDAGES/DRESSINGS) IMPLANT
DRSG COVADERM 4X6 (GAUZE/BANDAGES/DRESSINGS) ×2 IMPLANT
DRSG COVADERM 4X8 (GAUZE/BANDAGES/DRESSINGS) IMPLANT
DRSG TEGADERM 2-3/8X2-3/4 SM (GAUZE/BANDAGES/DRESSINGS) ×1 IMPLANT
ELECT REM PT RETURN 9FT ADLT (ELECTROSURGICAL) ×2
ELECTRODE REM PT RTRN 9FT ADLT (ELECTROSURGICAL) ×1 IMPLANT
EVACUATOR SILICONE 100CC (DRAIN) ×1 IMPLANT
GAUZE SPONGE 2X2 8PLY STRL LF (GAUZE/BANDAGES/DRESSINGS) IMPLANT
GAUZE SPONGE 4X4 16PLY XRAY LF (GAUZE/BANDAGES/DRESSINGS) ×1 IMPLANT
GLOVE BIO SURGEON STRL SZ7 (GLOVE) ×4 IMPLANT
GLOVE BIOGEL PI IND STRL 6.5 (GLOVE) IMPLANT
GLOVE BIOGEL PI IND STRL 7.0 (GLOVE) IMPLANT
GLOVE BIOGEL PI IND STRL 7.5 (GLOVE) ×1 IMPLANT
GLOVE BIOGEL PI INDICATOR 6.5 (GLOVE) ×2
GLOVE BIOGEL PI INDICATOR 7.0 (GLOVE) ×3
GLOVE BIOGEL PI INDICATOR 7.5 (GLOVE) ×5
GLOVE SS BIOGEL STRL SZ 6.5 (GLOVE) IMPLANT
GLOVE SS BIOGEL STRL SZ 7.5 (GLOVE) IMPLANT
GLOVE SUPERSENSE BIOGEL SZ 6.5 (GLOVE) ×2
GLOVE SUPERSENSE BIOGEL SZ 7.5 (GLOVE) ×3
GOWN STRL NON-REIN LRG LVL3 (GOWN DISPOSABLE) ×9 IMPLANT
INSERT FOGARTY SM (MISCELLANEOUS) IMPLANT
KIT BASIN OR (CUSTOM PROCEDURE TRAY) ×2 IMPLANT
KIT ROOM TURNOVER OR (KITS) ×2 IMPLANT
MARKER GRAFT CORONARY BYPASS (MISCELLANEOUS) IMPLANT
NS IRRIG 1000ML POUR BTL (IV SOLUTION) ×5 IMPLANT
PACK PERIPHERAL VASCULAR (CUSTOM PROCEDURE TRAY) ×2 IMPLANT
PAD ARMBOARD 7.5X6 YLW CONV (MISCELLANEOUS) ×4 IMPLANT
PADDING CAST COTTON 6X4 STRL (CAST SUPPLIES) IMPLANT
PATCH VASCULAR VASCU GUARD 1X6 (Vascular Products) ×1 IMPLANT
SET MICROPUNCTURE 5F STIFF (MISCELLANEOUS) ×1 IMPLANT
SPONGE GAUZE 2X2 STER 10/PKG (GAUZE/BANDAGES/DRESSINGS) ×1
SPONGE GAUZE 4X4 12PLY (GAUZE/BANDAGES/DRESSINGS) ×1 IMPLANT
SPONGE INTESTINAL PEANUT (DISPOSABLE) ×1 IMPLANT
SPONGE SURGIFOAM ABS GEL 100 (HEMOSTASIS) ×2 IMPLANT
STAPLER VISISTAT 35W (STAPLE) IMPLANT
STOPCOCK 4 WAY LG BORE MALE ST (IV SETS) ×1 IMPLANT
SUT ETHILON 3 0 PS 1 (SUTURE) IMPLANT
SUT GORETEX 5 0 TT13 24 (SUTURE) IMPLANT
SUT GORETEX 6.0 TT13 (SUTURE) IMPLANT
SUT MNCRL AB 4-0 PS2 18 (SUTURE) ×5 IMPLANT
SUT PROLENE 5 0 C 1 24 (SUTURE) ×2 IMPLANT
SUT PROLENE 6 0 BV (SUTURE) ×16 IMPLANT
SUT PROLENE 7 0 BV 1 (SUTURE) ×2 IMPLANT
SUT PROLENE 7 0 BV1 MDA (SUTURE) ×1 IMPLANT
SUT SILK 2 0 FS (SUTURE) ×2 IMPLANT
SUT SILK 3 0 (SUTURE)
SUT SILK 3-0 18XBRD TIE 12 (SUTURE) IMPLANT
SUT VIC AB 2-0 CT1 27 (SUTURE) ×4
SUT VIC AB 2-0 CT1 TAPERPNT 27 (SUTURE) ×2 IMPLANT
SUT VIC AB 3-0 SH 27 (SUTURE) ×4
SUT VIC AB 3-0 SH 27X BRD (SUTURE) ×3 IMPLANT
SYR 3ML LL SCALE MARK (SYRINGE) ×2 IMPLANT
SYR 5ML LL (SYRINGE) ×1 IMPLANT
SYR TB 1ML LUER SLIP (SYRINGE) ×1 IMPLANT
TOWEL OR 17X24 6PK STRL BLUE (TOWEL DISPOSABLE) ×5 IMPLANT
TOWEL OR 17X26 10 PK STRL BLUE (TOWEL DISPOSABLE) ×4 IMPLANT
TRAY FOLEY BAG SILVER LF 14FR (CATHETERS) ×1 IMPLANT
TUBING EXTENTION W/L.L. (IV SETS) ×1 IMPLANT
UNDERPAD 30X30 INCONTINENT (UNDERPADS AND DIAPERS) ×2 IMPLANT
WATER STERILE IRR 1000ML POUR (IV SOLUTION) ×2 IMPLANT

## 2013-08-17 NOTE — Transfer of Care (Signed)
Immediate Anesthesia Transfer of Care Note  Patient: [name] [name]  Procedure(s) Performed: Procedure(s): THROMBECTOMY FEMORAL ARTERY (Left) ENDARTERECTOMY FEMORAL with patch angioplasty using a 1cm by 6cm Vascuguard bovine patch (Left) INTRA OPERATIVE ARTERIOGRAM (Left)  Patient Location: PACU  Anesthesia Type:General  Level of Consciousness: sedated  Airway & Oxygen Therapy: Patient Spontanous Breathing and Patient connected to face mask oxygen  Post-op Assessment: Report given to PACU RN and Post -op Vital signs reviewed and stable  Post vital signs: Reviewed and stable  Complications: No apparent anesthesia complications

## 2013-08-17 NOTE — Progress Notes (Signed)
[facility]Link Physician-Brief Progress Note Patient Name: [name] [name] DOB: 12/25/1934 MRN: [id number]  Date of Service  08/20/2013   HPI/Events of Note   Shock state and bleeding from L femoral artery after thrombectomy.  Pt lost 1500cc blood . Hx of EF 55% but R sided elevated pressures and OSA so likely will be volume responsive. Wears CPAP at home  eICU Interventions  Add cpap F/u H/H Change ivf to NS at 50/hr   Intervention Category Major Interventions: Shock - evaluation and management Intermediate Interventions: Bleeding - evaluation and treatment with blood products  Shan Levans 08/08/2013, 5:39 PM

## 2013-08-17 NOTE — Anesthesia Procedure Notes (Signed)
Procedure Name: Intubation Date/Time: 08/04/2013 9:31 AM Performed by: Brien Mates D Pre-anesthesia Checklist: Patient identified, Emergency Drugs available, Suction available, Patient being monitored and Timeout performed Patient Re-evaluated:Patient Re-evaluated prior to inductionOxygen Delivery Method: Circle system utilized Preoxygenation: Pre-oxygenation with 100% oxygen Intubation Type: IV induction Ventilation: Mask ventilation without difficulty Laryngoscope Size: Miller and 2 Grade View: Grade II Tube type: Oral Tube size: 7.5 mm Number of attempts: 1 Airway Equipment and Method: Stylet Placement Confirmation: ETT inserted through vocal cords under direct vision,  positive ETCO2 and breath sounds checked- equal and bilateral Secured at: 22 cm Tube secured with: Tape Dental Injury: Teeth and Oropharynx as per pre-operative assessment

## 2013-08-17 NOTE — Consult Note (Signed)
PULMONARY  / CRITICAL CARE MEDICINE  Name: [name] [name] MRN: [id number] DOB: [birth year]    ADMISSION DATE:  09/01/13 CONSULTATION DATE:  08/28/2013   REFERRING MD :  DR. Imogene Burn  PCP - Dhruv Vyas  PRIMARY SERVICE:   CHIEF COMPLAINT:  Hypotension/shock  post op   BRIEF PATIENT DESCRIPTION: 78 yo with CAD, DM-2, AF on xarelto with hemorrhagic shock after emergent Left iliofemoropopliteal thromboembolectomy requiring Patch angioplasty of left iliofemoral arterial segment Pt had 750 c blood loss in OR , transfused 4 u PRBC and given albumin x 2 . Pt w/ hypotension started on pressor support.  PCCM asked to consult for hypotension /shock     SIGNIFICANT EVENTS / STUDIES:  11/14 CT angio bifem >embolism left common femoral , left popliteal arteries w/ extension to left peroneal artery as well as right deep femoral artery and post tibial arteries.    LINES / TUBES: 11/15 R. IJ >>  CULTURES:   ANTIBIOTICS:   HISTORY OF PRESENT ILLNESS:    [name] [name] is a 78 y.o. ([birth year]) male with diabetes and hyperlipidemia , OSA , CAD and Atrial Fib  on Xarelto  who presented 11/14  with chief complaint: purple left great toe. Two weeks ago, patient fell and injuried his left foot. His great toe on that side turned blue. Over the last 2 weeks, he has not be able to resolve that injury. He felt his left great toe was worsening in color and decided to go to [facility] at 1 PM. The ED physician there notice asx femoral pulse and had pt transferred to [facility] for further evaluation.    Found to have an acute on chronic left leg ischemia  W/ left leg thromboembolism.   Hx of recent cardioversion for Atrial Fib , H/o of DVT.  Pt was taken to OR 08/03/2013 for Left iliofemoropopliteal thromboemboletomy and endartectomy.  Pt had 750 c blood loss in OR , transfused 4 u PRBC and given albumin x 2 . Pt w/ hypotension started on pressor support.  PCCM asked to consult for hypotension /shock .  Pt  was started on Dopamine with slight improved b/p but with tachycardia, Changed over to Neo-Synephrine .  R,. IJ placed by anesthesia.  Pt was extubated with good sats on 2 l/m [place] . Alert but lethargic , follows commands.   PAST MEDICAL HISTORY :  Past Medical History  Diagnosis Date  . Coronary artery disease   . Hypertension   . Hyperlipidemia   . Heart murmur   . Diabetes mellitus without complication   . GERD (gastroesophageal reflux disease)   . History of kidney stones   . History of gout   . Sleep apnea     USES C-PAP  . Cyst of eye     X3 CYSTS in left eyeHX OF LOSS OF VISION - RESOLVED AT PRESENT  . Pneumonia     hx of  . Arthritis   . Still's disease 1997    in remission  . Complication of anesthesia     can not pee after surgery   Past Surgical History  Procedure Laterality Date  . Joint replacement  1996/2005    bil  . Lung mass  1965    benign mass removed  . Colon resection      benign mass  . Hiatal hernia repair  2000  . Aortic valve replacement  2009  . Back surgery  2011  . Coronary artery bypass graft  2000    3 VESSELLS  . Lithotripsy    . Tonsillectomy  at 78 years old  . Hammer toe surgery Right     x2  . Bunionectomy Left   . Knee arthroscopy Right   . Total knee revision Right 04/10/2013    Procedure: RIGHT TOTAL KNEE REVISION;  Surgeon: Loanne Drilling, MD;  Location: [facility] ORS;  Service: Orthopedics;  Laterality: Right;  . Cardioversion N/A 04/12/2013    Procedure: CARDIOVERSION;  Surgeon: Chrystie Nose, MD;  Location: [facility] ENDOSCOPY;  Service: Cardiovascular;  Laterality: N/A;   Prior to Admission medications   Medication Sig Start Date End Date Taking? Authorizing Provider  atorvastatin (LIPITOR) 10 MG tablet  08/02/13  Yes Historical Provider, MD  cephALEXin (KEFLEX) 500 MG capsule  08/06/13  Yes Historical Provider, MD  LORazepam (ATIVAN) 1 MG tablet Take 1 mg by mouth at bedtime.   Yes Historical Provider, MD  metFORMIN (GLUCOPHAGE) 500  MG tablet Take 500 mg by mouth 2 (two) times daily with a meal.   Yes Historical Provider, MD  metoprolol (LOPRESSOR) 50 MG tablet Take 50 mg by mouth 2 (two) times daily.   Yes Historical Provider, MD  Rivaroxaban (XARELTO) 20 MG TABS Take 1 tablet (20 mg total) by mouth daily with breakfast. 04/15/13  Yes Alexzandrew Julien Girt, PA-C  Tamsulosin HCl (FLOMAX) 0.4 MG CAPS Take 0.4 mg by mouth daily.   Yes Historical Provider, MD  torsemide (DEMADEX) 20 MG tablet Take 20 mg by mouth daily.  08/02/13  Yes Historical Provider, MD  oxyCODONE (OXY IR/ROXICODONE) 5 MG immediate release tablet Take 1-2 tablets (5-10 mg total) by mouth every 3 (three) hours as needed. 04/15/13   Alexzandrew Julien Girt, PA-C   Allergies  Allergen Reactions  . Bee Venom Anaphylaxis    FAMILY HISTORY:  History reviewed. No pertinent family history. SOCIAL HISTORY:  reports that he quit smoking about 51 years ago. His smoking use included Cigarettes. He smoked 0.00 packs per day for 5 years. He has never used smokeless tobacco. He reports that he drinks alcohol. He reports that he does not use illicit drugs.  REVIEW OF SYSTEMS:    pt lethargic unable to answer at present time   SUBJECTIVE: low b/p post op on neo gtt  VITAL SIGNS: Temp:  [97.3 F (36.3 C)-98 F (36.7 C)] 98 F (36.7 C) (11/15 0554) Pulse Rate:  [81-92] 92 (11/15 0554) Resp:  [14-20] 18 (11/15 0554) BP: (93-129)/(61-79) 93/61 mmHg (11/15 0554) SpO2:  [94 %-100 %] 94 % (11/15 0554) Weight:  [194 lb 10.7 oz (88.3 kg)] 194 lb 10.7 oz (88.3 kg) (11/14 2307) HEMODYNAMICS:   VENTILATOR SETTINGS:   INTAKE / OUTPUT: Intake/Output     11/14 0701 - 11/15 0700 11/15 0701 - 11/16 0700   I.V. (mL/kg)  4000 (45.3)   Blood  1300   IV Piggyback  500   Total Intake(mL/kg)  5800 (65.7)   Urine (mL/kg/hr) 220 465 (0.6)   Blood  1500 (1.8)   Total Output 220 1965   Net -220 +3835        Urine Occurrence 1 x      PHYSICAL EXAMINATION: General:  Obese male  ,alert bu lethargic  Neuro:  Alert, f/c  HEENT:  nml  Cardiovascular:  ST , no edema  Lungs:  Diminshed BS in bases  Abdomen:  Soft NT BS hypoactive  Musculoskeletal:  intact Skin:  Discoloration along left foot , pale   LABS:  CBC  Recent Labs Lab 2013-09-06 0350  September 06, 2013 1336 09-06-13 1406 2013-09-06 1504  WBC 12.6*  --   --   --   --   HGB 11.5*  < > 9.2* 10.9* 9.9*  HCT 33.8*  < > 27.0* 32.0* 29.0*  PLT 277  --   --   --   --   < > = values in this interval not displayed. Coag's No results found for this basename: APTT, INR,  in the last 168 hours BMET  Recent Labs Lab Sep 06, 2013 0350  2013-09-06 1336 06-Sep-2013 1406 09-06-2013 1504  NA 131*  < > 135 135 135  K 4.5  < > 4.7 5.4* 4.7  CL 97  --   --   --   --   CO2 22  --   --   --   --   BUN 24*  --   --   --   --   CREATININE 1.05  --   --   --   --   GLUCOSE 104*  < > 161* 188* 190*  < > = values in this interval not displayed. Electrolytes  Recent Labs Lab 09/06/2013 0350  CALCIUM 8.9   Sepsis Markers No results found for this basename: LATICACIDVEN, PROCALCITON, O2SATVEN,  in the last 168 hours ABG No results found for this basename: PHART, PCO2ART, PO2ART,  in the last 168 hours Liver Enzymes No results found for this basename: AST, ALT, ALKPHOS, BILITOT, ALBUMIN,  in the last 168 hours Cardiac Enzymes No results found for this basename: TROPONINI, PROBNP,  in the last 168 hours Glucose  Recent Labs Lab 06-Sep-2013 0553 Sep 06, 2013 1441  GLUCAP 105* 184*    Imaging Ct Angio Ao+bifem W/cm &/or Wo/cm  08/30/2013   CLINICAL DATA:  Avascular necrosis of the left foot, left great toe is purple and red, history of diabetes and DVT  EXAM: CT ANGIOGRAPHY OF ABDOMINAL AORTA WITH ILIOFEMORAL RUNOFF  TECHNIQUE: Multidetector CT imaging of the abdomen, pelvis and lower extremities was performed using the standard protocol during bolus administration of intravenous contrast. Multiplanar CT image reconstructions including  MIPs were obtained to evaluate the vascular anatomy.  CONTRAST:  OMNIPAQUE IOHEXOL 350 MG/ML SOLN  COMPARISON:  Bilateral lower extremity venous Doppler ultrasound - 06/13/2013 ; CT abdomen pelvis - 03/18/2011  FINDINGS: Vascular Findings:  Abdominal aorta: Rather extensive primarily calcified atherosclerotic plaque within the abdominal aorta. There is mild ectasia of the infrarenal abdominal aorta measuring approximately 2.6 cm (axial image 76, series 5, sagittal image 110, series 9). No abdominal aortic dissection or periaortic stranding.  Celiac artery: The origin of the celiac artery is noted to be mildly tortuous. There is a mixture of calcified and noncalcified plaque involving the origin and proximal aspect of the celiac artery, not definitely resulting in hemodynamically significant stenosis. Conventional branching pattern.  SMA: There is a mixture of calcified and noncalcified plaque involving the origin and main trunk of the SMA, not resulting in hemodynamically significant stenosis. Incidental note is made of a replaced right hepatic artery arising from the proximal SMA.  Right Renal artery: Solitary with early bifurcation. There is a mixture of calcified and noncalcified atherosclerotic plaque involving the origin and proximal aspect of the right renal artery, possibly approaching 50% luminal narrowing.  Left Renal artery: Solitary; there is eccentric primarily calcified atherosclerotic plaque involving the origin and proximal aspect of the left renal artery, not resulting in hemodynamically significant narrowing.  IMA: Widely patent.   ------------------------------------------------------------------------------------------------------  Right lower extremity:  Pelvic vasculature: There is a mixture of calcified and noncalcified plaque involving the right common iliac artery, not result in hemodynamically significant narrowing. There is mixed calcified and noncalcified atherosclerotic plaque  within both the right external and internal iliac arteries, not resulting in hemodynamically significant narrowing.  Right common femoral artery: Minimal amount of eccentric atherosclerotic plaque, not resulting in hemodynamically significant narrowing.  Right deep femoral artery: There is a discrete filling defect within the proximal aspect of the right deep femoral artery (images 167, 175 and 184, series 5) with short segment occlusion extending to the level of the proximal/ mid thigh. There is reconstitution of the distal aspect of the right deep femoral artery via deep thigh collaterals.  Right superficial femoral artery: There is scattered minimal mixed calcified and noncalcified atherosclerotic plaque within in the proximal and distal aspects of the right superficial femoral artery, not resulting in hemodynamically significant stenosis.  Right popliteal artery: The above knee popliteal artery is degraded due to streak artifact from the right total knee replacement. The below-knee popliteal artery appears patent throughout its imaged course.  Right lower leg: There is a apparent nonocclusive embolism within in the proximal aspect of the right posterior tibial artery (representative images 328 and 331, series 5). Though the vessel remains patent to the level of the forefoot. The right anterior tibial and peroneal arteries are patent. A right-sided dorsalis pedis artery is patent to the level of the mid foot.   ------------------------------------------------------------------------------  Left lower extremity:  Pelvic vasculature: There is mixed calcified and noncalcified plaque within the left common, external and internal iliac artery is all of which are of normal caliber and free of hemodynamically significant narrowing.  Left common femoral artery: Occluded throughout its course with reconstitution via pelvic collateral flow.  Deep femoral artery: Widely patent throughout its course, though there does appear  to be a suspected minimal amount of distal nonocclusive thrombus within the distal aspect of a anterior branch vessel of the deep femoral artery (image 208, series 5).  Left superficial femoral artery: Embolism within the left common femoral artery extends to involve the proximal aspect of the left superficial femoral artery with early reconstitution. There is a minimal amount of calcified and noncalcified atherosclerotic plaque within the distal aspect of the left common femoral artery, not resulting in hemodynamically significant stenosis.  Left popliteal artery: The above knee popliteal artery is degraded due to streak artifact from the adjacent left total knee replacement. There is nonocclusive embolism seen throughout the image course of the left below-knee popliteal artery (image 303, series 5).  Left lower leg: There is nonocclusive thrombus which extends to involve the proximal aspect of the left peroneal artery (image 325). There is short segment occlusion of the proximal aspect of the left anterior tibial artery (image 346) with early reconstitution. The left posterior tibial artery is patent to the level of the forefoot. A patent left-sided dorsalis pedis artery is not identified.  Review of the MIP images confirms the above findings.   --------------------------------------------------------------------------------  Nonvascular Findings:  Evaluation of the abdominal organs is limited to the arterial phase of enhancement.  Normal hepatic contour. No discrete hyperenhancing hepatic lesions. The gallbladder is distended but otherwise normal. No discrete radiopaque gallstones. No ascites.  There is symmetric enhancement of the bilateral kidneys. There is an approximately 2.7 x 2.9 cm partially exophytic hypo attenuating (-8 Hounsfield unit) cyst arising from the mid aspect of the right kidney (image 69, series 5). There  is an additional approximately 1.9 cm hypoattenuating (7 Hounsfield unit) left-sided  renal cyst (image 79). Additional bilateral subcentimeter hypoattenuating renal lesions are too small to accurately characterize. There is a grossly unchanged approximate 1.4 x 1.5 cm nonobstructing stone within the mid aspect of the left kidney (coronal image 116, series 7). No urinary obstruction. Grossly symmetric likely age-related perinephric stranding. No stones are seen along the expected course of either ureter or within the urinary bladder. Normal appearance of the urinary bladder given the. Distention. Normal appearance of the bilateral adrenal glands and pancreas. Likely normal early arterial phase appearance of the spleen with associated grossly unchanged cleft within its mid aspect. Incidental note is made of a prominent splenule within the splenic hilum. No perisplenic stranding.  Scattered colonic diverticulosis without evidence of diverticulitis. Postsurgical change of the cecum. The appendix is not visualized, presumably surgically absent. No evidence of enteric obstruction. No pneumoperitoneum, pneumatosis or portal venous gas.  No retroperitoneal, mesenteric, pelvic or inguinal lymphadenopathy.  Limited visualization a lower thorax demonstrates minimal subsegmental atelectasis within the imaged left lower lobe. No focal airspace opacities. No pleural effusion.  Cardiomegaly. Post median sternotomy and aortic valve replacement. No pericardial effusion.  No acute or aggressive osseous lesions. Stable sequela of L4-L5 ACDF with associated right-sided paraspinal fusion at this level. Bilateral facet degenerative change within the lower lumbar spine. Post bilateral total knee replacements including right-sided redo total knee replacement.  IMPRESSION: 1. Overall findings worrisome for embolic phenomenon involving left common femoral and left below-knee popliteal arteries with extension to involve primarily the left peroneal artery as well as the right deep femoral artery and right posterior tibial  arteries. As a discrete source of this distal embolism is not depicted on this examination (in particular, no evidence of significant abdominal aortic aneurysm), further evaluation with cardiac echo (especially in light of known aortic valve replacement) may be performed as clinically indicated. 2. Rather extensive atherosclerotic plaque within a mildly ectatic abdominal aorta which measures approximately 2.6 cm in maximum diameter. 3. Right-sided nonobstructing nephrolithiasis. Critical Value/emergent results were called by telephone at the time of interpretation on 2013/08/26 at 10:26 PM to Dr.ROBERT BEATON , who verbally acknowledged these results.   Electronically Signed   By: Simonne Come M.D.   On: 08-26-2013 22:29     CXR: RIJ in good position  ASSESSMENT / PLAN:  PULMONARY A:  Hx of OSA  P:   Titrate O2 for sat >90% Nocturnal CPAP   CARDIOVASCULAR A: Hypotension /Hypovolemic Shock  CAD /Atrial fib on Xarelto prior to admission  PVD w/ critical left limb ischemia w/  Tachycardia  RBBB -baseline  P:  Check cardiac enzymes Titrate Neo for MAP >65  Wean off dopamine  Check PCT /lactate / trop Check 2 D echo  Defer to vascular about anticoagulation  RENAL A:  Renal Insuffiency  P:   Monitor   GASTROINTESTINAL A:   P:   Monitor  PPI  NPO for now   HEMATOLOGIC A:  Anemia -post op blood loss  S/p 4 u PRBC and 2 albumin during surgery 11/15   P:  Monitor cbc closely , transfuse for Hb 8 or lower given CAD  INFECTIOUS A:   P:   Tr temp and wbc  Check pct  Check xray    ENDOCRINE A:  DM  P:   ICU SSI   NEUROLOGIC A:  Dementia  P:   Monitor  Restart po meds when b/p improves   TODAY'S  SUMMARY: Hemorrhagic shock post femoral artery surgery with 4U transfused , now on neogtt being titrated off    PARRETT,TAMMY NP-C   I have personally obtained a history, examined the patient, evaluated laboratory and imaging results, formulated the assessment and plan  and placed orders. CRITICAL CARE: The patient is critically ill with multiple organ systems failure and requires high complexity decision making for assessment and support, frequent evaluation and titration of therapies, application of advanced monitoring technologies and extensive interpretation of multiple databases. Critical Care Time devoted to patient care services described in this note is 50 minutes.   Oretha Milch  Pulmonary and Critical Care Medicine [facility] Pager: [phone]  2013/09/09, 4:19 PM

## 2013-08-17 NOTE — Progress Notes (Signed)
Placed patient on CPAP via auto-mode with maximum pressure set at 20cm and minimum pressure set at 4cm. Oxygen set at 2lpm

## 2013-08-17 NOTE — Op Note (Addendum)
OPERATIVE NOTE   PROCEDURE: 1. Left iliofemoropopliteal thromboembolectomy 2. Left iliofemoral endarterectomy 3. Patch angioplasty of left iliofemoral arterial segment 4. Open cannulation of common femoral artery  5. Left leg runoff   PRE-OPERATIVE DIAGNOSIS: left leg thromboembolism  POST-OPERATIVE DIAGNOSIS: same as above   SURGEON: Leonides Sake, MD  ANESTHESIA: general  ESTIMATED BLOOD LOSS: 1500 cc  FINDING(S): 1. Circumferentially calcified common femoral artery with possible ruptured plaque distally at the level of femoral bifurcation 2. Thromboembolism in common femoral artery, external iliac artery, superficial femoral artery and profunda femoral artery  3. No thrombus retrieved from below-the-knee popliteal artery 4.   Atypical high trifurcation at the level of knee 5.   Short segment occlusion of anterior tibial artery with dimunitive peroneal artery 6.   Runoff to left foot via posterior tibial artery  SPECIMEN(S):  Femoral thromboembolus  INDICATIONS:   [name] [name] is a 78 y.o. male on Xarelto for DVT who presents with left great toe discoloration.  The patient had a two week history of non-healing injury to left great toe.  He came to the hospital with a purple toe and his CTA was consistent with possible thromboembolism in the setting chronic atherosclerosis.  I recommended he undergo a left leg thromboembolectomy to try to salvage his left leg.  The patient had a palpable right dorsalis pedis and was asymptomatic in that leg despite some evidence of thromboembolism in that leg.  Due to his Xarelto, I recommended delaying the procedure to try to limit his blood loss, given his previous cardiac history.  The risk, benefits, and alternative for the procedure were discussed with the patient.  The patient is aware the risks include but are not limited to: bleeding, infection, myocardial infarction, stroke, limb loss, nerve damage, need for additional procedures in the  future, wound complications, and inability to complete the procedure.  The patient is aware of these risks and agreed to proceed.  DESCRIPTION: After obtaining full informed written consent, the patient was brought back to the operating room and placed supine upon the operating table.  The patient received IV antibiotics prior to induction.  After obtaining adequate anesthesia, the patient was prepped and draped in the standard fashion for: left leg bypass.  Under Son[employer] guidance, I identified the left common femoral artery.  I made a longitudinal incision over the artery and dissected through the subcutaneous tissue with electrocautery.  It was obvious that the patient was still anticoagulated due to the diffuse bleeding present.  The subcutaneous tissue around the common femoral artery demonstrated evidence of prior inflammation, as there was extensive dense tissue around the left common femoral artery.  With some difficulty, I dissected out the left common femoral artery from the external iliac artery down to the superficial femoral artery and profunda femoral artery.  The tissue around the profunda femoral artery was particularly dense.  After obtaining control of these vessels, I gave 7000 units of Heparin.  In total, I gave 16109 units of Heparin throughout this case to obtain full anticoagulation.  After waiting three minutes, I clamped the distal external iliac artery and placed the superficial femoral artery and profunda femoral artery under tension with vessel loops.  I made an incision over the common femoral artery and extended it proximally and distally with a Pott's scissor.  immediately two clusterings of thrombus was evident.  Proximally, there was acute thrombus.  At the level of the femoral bifurcation, there were findings consistent with either in-situ plaque rupture or  plaque embolized from a more proximal source.    I removed this thrombus and passed it off the field as a specimen.  In  the process of removing the thrombus, it was evident that this common femoral artery was extremely diseased and an endarterectomy with bovine patch angioplasty would likely be needed.  At this point, I sequentially passed a 3, 4, and then 5 Fogarty catheter distally, extract minimal thrombus from the superficial femoral artery and none from the below-the-knee popliteal segment.  I was not able to advance any catheter past the distal popliteal segment.  Proximally, I had pulsatile bleeding from the external iliac artery without any evidence of thromboembolism.  At this point, I began the endarterectomy in the common femoral artery.  The posterior wall plaque was severely calcified and adherent.  Eventually, I was able to develop a plane superficial to this plaque.  I carried this endarterectomy from the distal external iliac artery down into the superficial femoral artery.  The plaque proximally in the superficial femoral artery appeared friable, so I extended the arteriotomy into proximal superficial femoral artery.  The disease in the proximal 3 cm of superficial femoral artery feathered out.  With great difficulty I carried the dissection into the profunda femoral artery, which had luminal compromise with calcification.  I finished the dissection in the distal external iliac artery where the disease feathered out.  I spent the next 15 minute extracting residual intimal flaps until the wall in a few areas appeared quite thin.  I then fashioned a bovine patch for the geometry of this arteriotomy.  I sewed this patch to the iliofemoral segment with two running stitches of 6-0 Prolene.  Prior to completing this patch angioplasty, I backbled all arteries.  No thrombus was present.  I completed the patch angioplasty in the usual fashion.  I released all clamps and vessel loops and then packed the left groin with thrombin and gelfoam.  At this point, I dopplered the pedal vessels.  There was a faint posterior tibial  artery which was not present previously.  I, however, had problems verifying this signal's presence, so I decided to complete a left leg angiogram.  I cannulated the bovine patched common femoral artery with a micropuncture needle.  The wire was passed up into the external iliac artery.  The needle was exchanged for a microsheath.  The sheath was connected to IV tubing.  I completed the left leg angiogram from knee down.  Images were limited by the operative table which was not an angiographic table.  I could not image the common femoral artery or profunda femoral artery or superficial femoral artery due to the table.  Images at the knee level were interfered with by his total knee prosthesis.  The findings are listed above.  Based on the images, I felt no further intervention was immediately necessary as he had a posterior tibial artery runoff.  At this point, he had already bled 500-750 cc of blood already with Xarelto and Heparin onboard and his blood pressure was drifting down.   I turned my attention to the left groin.  i sewed a 6-0 Prolene Z-stitch around the microsheath and tied it while removing the sheath.  While trying to dry up the groin, an acute bleed occurred.  It became evident that the anterior wall of the profunda femoral artery was progressively falling apart, likely due to endarterectomy of the heavy calcification in the profunda artery.  Despite multiple stitches and use of pledgets,  the patient bled profusely.  I tried reversing the heparin with 30 mg of Protamine, but this failed to control the bleeding.  After another 750 cc of bleeding, I elected to give another 7000 unit of Heparin intravenously.  After three minutes, I reclamped the external iliac artery, superficial femoral artery and profunda femoral artery.  I sharply opened the suture line on the distal patch and released 1/2 of the suture line on both sides of the patch.  I transected the superficial femoral artery just distal to  the bifurcation, leaving a rim to reanastomose the superficial femoral artery.  After multiple manuevers with the clamp on the profunda femoral artery, I was able to allow backbleeding from the profunda femoral artery while maintaining control of the bleeding from the common femoral artery.  I was able to identified the area of residual rupture in the anterior wall of the profunda femoral artery.  I repaired this area with a pledgeted 6-0 Prolene stitch.  This appeared to control the bleeding.  The anterior wall of the profunda femoral artery essentially was bolstered by felt pledgets.  I then reanastomosed the superficial femoral artery to the femoral bifurcation with a running stitch of 6-0 Prolene.  I resewed the patch onto the common femoral artery and superficial femoral artery, by tieing to the residual sutures.  I completed this repair in the distal half of the patch.  Prior to completion, I backbled all vessels.  No thrombus was present.  I also passed the 4 Fogarty distally to verify no thrombosis during the prior period of clamping for bleeding control.    At this point, all clamps were removed.  There was no active bleeding even with full anticoagulation at this time.  The patient's blood pressure was in the 80-90s at this point, so I felt the risk of hypotension with Protamine was not indicated.  I also plan on anticoagulation in 6 hours anyway.  I elected to place a 15 mm Blake drain to evacuate any persistent bleeding, given his Xarelto status.  I passed the trocar through the inferior aspect of this wound.  I cut the drain to appropriate length and placed it directly adjacent to the patched common femoral artery.  I secured the drain in place with a 3-0 Nylon stitch tied to the drain.  I repaired the left groin with a double layer of 2-0 Vicryl in the tissue immediately superficial to the common femoral artery.  I then placed a double layer of 3-0 Vicryl in the superficial subcutaneous tissue.  The  skin was reapproximated with staples due to the bleeding risks.  A sterile bandage was applied to this incision and drain site.  The drain was connected to sterile bulb suction.  COMPLICATIONS: none  CONDITION: stable  Leonides Sake, MD [facility] Office: [phone] Pager: [phone]  08/18/2013, 2:33 PM

## 2013-08-17 NOTE — Preoperative (Signed)
Beta Blockers   Reason not to administer Beta Blockers:Not Applicable 

## 2013-08-17 NOTE — H&P (View-Only) (Signed)
[facility] [place]  Referred by:  [facility]  Reason for referral: possible Left foot ischemia  History of Present Illness  [name] [name] is a 78 y.o. (04/18/1935) male with diabetes and hyperlipidemia who presents with chief complaint: purple left great toe.  Two weeks ago, patient fell and injuried his left foot.  His great toe on that side turned blue.  Over the last 2 weeks, he has not be able to resolve that injury.  He felt his left great toe was worsening in color and decided to go to [facility] at 1 PM.  The ED physician there notice asx femoral pulse and had pt transferred to [facility] for further evaluation.  Pain is described as aching, severity 3-6/10, and associated with manipulation of left great toe.  Patient has attempted to treat this pain with rest.  The patient has no rest pain symptoms also and no prior history of leg wounds/ulcers.  The wife notes a prior history of "bad circulation."  Per the family, the patient is on some form of anticoagulation.  Atherosclerotic risk factors include: HTN, Hyperlipidemia, and h/o smoking.  History is somewhat limited in this patient due to poor memory.  Past Medical History  Diagnosis Date  . Coronary artery disease   . Hypertension   . Hyperlipidemia   . Heart murmur   . Diabetes mellitus without complication   . GERD (gastroesophageal reflux disease)   . History of kidney stones   . History of gout   . Sleep apnea     USES C-PAP  . Cyst of eye     X3 CYSTS in left eyeHX OF LOSS OF VISION - RESOLVED AT PRESENT  . Pneumonia     hx of  . Arthritis   . Still's disease 1997    in remission  . Complication of anesthesia     can not pee after surgery   Past Surgical History  Procedure Laterality Date  . Joint replacement  1996/2005    bil  . Lung mass  1965    benign mass removed  . Colon resection      benign mass  . Hiatal hernia repair  2000  . Aortic valve replacement  2009  . Back surgery   2011  . Coronary artery bypass graft  2000    3 VESSELLS  . Lithotripsy    . Tonsillectomy  at 78 years old  . Hammer toe surgery Right     x2  . Bunionectomy Left   . Knee arthroscopy Right   . Total knee revision Right 04/10/2013    Procedure: RIGHT TOTAL KNEE REVISION;  Surgeon: Frank V Aluisio, MD;  Location: [facility];  Service: Orthopedics;  Laterality: Right;  . Cardioversion N/A 04/12/2013    Procedure: CARDIOVERSION;  Surgeon: Kenneth C. Hilty, MD;  Location: [facility];  Service: Cardiovascular;  Laterality: N/A;    History   Social History  . Marital Status: Married    Spouse Name: N/A    Number of Children: N/A  . Years of Education: N/A   Occupational History  . Not on file.   Social History Main Topics  . Smoking status: Former Smoker -- 5 years    Types: Cigarettes    Quit date: 11/21/1961  . Smokeless tobacco: Never Used  . Alcohol Use: Yes     Comment: rare  . Drug Use: No  . Sexual Activity: Not on file   Other Topics Concern  .   Not on file   Social History Narrative  . No narrative on file   Family History: patient cannot remember any medical problems in his father or mother  No current facility-administered medications on file prior to encounter.   Current Outpatient Prescriptions on File Prior to Encounter  Medication Sig Dispense Refill  . B Complex-C (B-COMPLEX WITH VITAMIN C) tablet Take 1 tablet by mouth daily.      . methocarbamol (ROBAXIN) 500 MG tablet Take 1 tablet (500 mg total) by mouth every 6 (six) hours as needed.  80 tablet  1  . metoprolol (LOPRESSOR) 50 MG tablet Take 75 mg by mouth 2 (two) times daily.       . Multiple Vitamin (MULTIVITAMIN WITH MINERALS) TABS Take 1 tablet by mouth daily.      . omega-3 acid ethyl esters (LOVAZA) 1 G capsule Take 1 g by mouth daily.      . oxyCODONE (OXY IR/ROXICODONE) 5 MG immediate release tablet Take 1-2 tablets (5-10 mg total) by mouth every 3 (three) hours as needed.  80 tablet  0  .  Rivaroxaban (XARELTO) 20 MG TABS Take 1 tablet (20 mg total) by mouth daily with breakfast.  21 tablet  1  . simvastatin (ZOCOR) 20 MG tablet Take 20 mg by mouth every evening.      . Tamsulosin HCl (FLOMAX) 0.4 MG CAPS Take 0.4 mg by mouth daily.      . temazepam (RESTORIL) 30 MG capsule Take 30 mg by mouth at bedtime as needed for sleep.      . traMADol (ULTRAM) 50 MG tablet Take 50 mg by mouth 2 (two) times daily.      . vitamin C (ASCORBIC ACID) 500 MG tablet Take 500 mg by mouth daily.      . vitamin E 1000 UNIT capsule Take 1,000 Units by mouth daily.        Allergies  Allergen Reactions  . Bee Venom    REVIEW OF SYSTEMS:  (Positives checked otherwise negative)  CARDIOVASCULAR:  [] chest pain, [] chest pressure, [] palpitations, [] shortness of breath when laying flat, [] shortness of breath with exertion,  [x] pain in feet when walking, [x] pain in feet when laying flat, [x] history of blood clot in veins (DVT), [] history of phlebitis, [] swelling in legs, [] varicose veins  PULMONARY:  [] productive cough, [] asthma, [] wheezing  NEUROLOGIC:  [] weakness in arms or legs, [] numbness in arms or legs, [] difficulty speaking or slurred speech, [x] temporary loss of vision in one eye, [] dizziness  HEMATOLOGIC:  [] bleeding problems, [x] problems with blood clotting too easily  MUSCULOSKEL:  [x] joint pain, [] joint swelling  GASTROINTEST:  [] vomiting blood, [] blood in stool     GENITOURINARY:  [] burning with urination, [] blood in urine, [x] incontinence, [x] frequency  PSYCHIATRIC:  [] history of major depression  INTEGUMENTARY:  [] rashes, [] ulcers  CONSTITUTIONAL:  [] fever, [] chills  For VQI Use Only  PRE-ADM LIVING: Home  AMB STATUS: Ambulatory  CAD Sx: None  PRIOR CHF: None  STRESS TEST: [x] No, [ ] Normal, [ ] + ischemia, [ ] + MI, [ ] Both  Physical Examination  Filed Vitals:   08/14/2013 1934  BP: 114/78  Pulse: 85  Temp: 97.5 F (36.4 C)   TempSrc: Oral  Resp: 14  SpO2: 98%   There is no weight on file to calculate BMI.    General: A&O x 3, elderly, thin  Head: [place]/AT  Ear/Nose/Throat: Hearing grossly intact, nares w/o erythema or drainage, oropharynx w/o Erythema/Exudate, Mallampati score: 3  Eyes: PERRLA, EOMI  Neck: Supple, no nuchal rigidity, no palpable LAD  Pulmonary: Sym exp, good air movt, CTAB, no rales, rhonchi, & wheezing  Cardiac: RRR, Nl S1, S2, no rubs or gallops, +HSM  Vascular: Vessel Right Left  Radial Palpable Palpable  Brachial Palpable Palpable  Carotid Palpable, without bruit Palpable, without bruit  Aorta Not palpable N/A  Femoral Palpable Faintly Palpable  Popliteal Faintly palpable Not palpable  PT Faintly Palpable Not Palpable  DP Palpable Not Palpable   Gastrointestinal: soft, NTND, -G/R, - HSM, - masses, - CVAT B, incontinent with diaper  Musculoskeletal: M/S 5/5 throughout including 3-4/5 B PF/DF, R foot without ischemic changes, L great toe with echymosis and small area of black eschar, petechiae on dorsum of left foot, no ascending erythema   Neurologic: CN 2-12 intact , Pain and light touch intact in extremities including left foot, Motor exam as listed above  Psychiatric: Judgment intact, Mood & affect appropriate for pt's clinical situation, some psychomotor slowing  Dermatologic: See M/S exam for extremity exam, no rashes otherwise noted  Lymph : No Cervical, Axillary, or Inguinal lymphadenopathy   Laboratory: CBC:    Component Value Date/Time   WBC 10.3 05/24/2013 1035   RBC 3.88* 05/24/2013 1035   HGB 11.8* 05/24/2013 1035   HCT 34.6* 05/24/2013 1035   PLT 332 05/24/2013 1035   MCV 89.2 05/24/2013 1035   MCH 30.4 05/24/2013 1035   MCHC 34.1 05/24/2013 1035   RDW 15.0 05/24/2013 1035   LYMPHSABS 1.2 05/24/2013 1035   MONOABS 0.7 05/24/2013 1035   EOSABS 0.1 05/24/2013 1035   BASOSABS 0.0 05/24/2013 1035    BMP:    Component Value Date/Time   NA 132* 05/24/2013 1035    K 3.8 05/24/2013 1035   CL 97 05/24/2013 1035   CO2 25 05/24/2013 1035   GLUCOSE 133* 05/24/2013 1035   BUN 7 05/24/2013 1035   CREATININE 0.69 05/24/2013 1035   CREATININE 0.69 04/12/2013 0442   CALCIUM 8.5 05/24/2013 1035   GFRNONAA 90* 04/12/2013 0442   GFRAA >90 04/12/2013 0442    Coagulation: Lab Results  Component Value Date   INR 0.98 04/02/2013   INR 1.06 11/21/2012   INR 0.93 09/06/2010   No results found for this basename: PTT   Outside labs reviewed: PT elevated, Cr 1.36  Medical Decision Making  [name] [name] is a 78 y.o. male who presents with: likely acute on chronic L leg ischemia, multiple co-morbidities including: DM, Hyperlipidemia, s/p CABG and AVR, recent cardioversion for Afib, h/o DVT critical limb ischemia.   This patient does NOT have a threatened leg, as if he had acute occlusion, he would already have a non-viable leg as >6 hours has already elapsed.  He also has intact motor and sensation.  With multiple atherosclerotic risk factors, this patient likely has baseline peripheral arterial disease.  The history from the family is consistent with such.  The patient has no history or evidence of recurrent cardiac arrhythmia which might account for an embolism.  Additionally, the patient is on full dose anticoagulation with Xarelto, which would decrease the probability of such.  After the Xarelto resolves in 24-48 hours, I would start a Heparin drip to help limit any further thrombosis.  Per the Pharmacy, elderly patients have extended half-life for the drugs, so he is likely   fully anticoagulate for > 24 hours.  Subsequently, any intervention at this point would likely be complicated by uncontrolled bleeding.  At this time of night, unfortunately, the only diagnostic test available is a CTA Ao with BRo.  Further intervention will be dependent on those findings.  This patient should be admitted to a hospitalist service for medical optimization including cardiac  optimization in case surgical intervention is necessary.  Further studies including a formal aortogram with bilateral leg runoff may be necessary if revascularization is necessary, as in my experience, the accuracy of the CTA runoff can be spotty at best.  I discussed in depth with the patient the nature of atherosclerosis, and emphasized the importance of maximal medical management including strict control of blood pressure, blood glucose, and lipid levels, antiplatelet agents, obtaining regular exercise, and cessation of smoking.  The patient is aware that without maximal medical management the underlying atherosclerotic disease process will progress, limiting the benefit of any interventions. The patient is currently on a statin:  Zocor.   The patient is on an anticoagulant: Xarleto Further recommendation dependent on the CTA.  Thank you for allowing us to participate in this patient's care.  Mattson Dayal, MD [facility] Office: [phone] Pager: [phone]  08/14/2013, 8:53 PM     

## 2013-08-17 NOTE — Progress Notes (Signed)
ANTICOAGULATION CONSULT NOTE - Initial Consult  Pharmacy Consult for Heparin Indication: DVT  Allergies  Allergen Reactions  . Bee Venom Anaphylaxis   Patient Measurements: Height: 5\' 11"  (180.3 cm) Weight: 194 lb 10.7 oz (88.3 kg) IBW/kg (Calculated) : 75.3 Heparin Dosing Weight: 88.3 kg Vital Signs: Temp: 96.8 F (36 C) (11/15 1645) Temp src: Oral (11/15 0554) BP: 95/68 mmHg (11/15 1655) Pulse Rate: 117 (11/15 1655) Labs:  Recent Labs  Sep 12, 2013 0350  2013-09-12 1336 09-12-2013 1406 09/12/2013 1504 09/12/2013 1528  HGB 11.5*  < > 9.2* 10.9* 9.9*  --   HCT 33.8*  < > 27.0* 32.0* 29.0*  --   PLT 277  --   --   --   --   --   CREATININE 1.05  --   --   --   --   --   CKTOTAL  --   --   --   --   --  14  CKMB  --   --   --   --   --  2.1  TROPONINI  --   --   --   --   --  <0.30  < > = values in this interval not displayed.  Estimated Creatinine Clearance: 63.7 ml/min (by C-G formula based on Cr of 1.05).   Medical History: Past Medical History  Diagnosis Date  . Coronary artery disease   . Hypertension   . Hyperlipidemia   . Heart murmur   . Diabetes mellitus without complication   . GERD (gastroesophageal reflux disease)   . History of kidney stones   . History of gout   . Sleep apnea     USES C-PAP  . Cyst of eye     X3 CYSTS in left eyeHX OF LOSS OF VISION - RESOLVED AT PRESENT  . Pneumonia     hx of  . Arthritis   . Still's disease 1997    in remission  . Complication of anesthesia     can not pee after surgery   Assessment: 78 YOM on xarelto PTA s/p emergent Left iliofemoropopliteal thromboembolectomy requiring Patch angioplasty of left iliofemoral arterial segment to start IV heparin to keep vessel patent. Patient did have significant blood loss  (total of 1500 cc) and was transfused 4 units of PRBCs. Patient is hypotensive with pressor support. Patient is at high risk for re-bleeding therefore will start at lower dose than protocol. Most recent Hgb is  9.9. SCr this am was 1.05 with estimated CrCl 63.27ml/min.   Goal of Therapy:  Heparin level 0.3-0.7 units/ml Monitor platelets by anticoagulation protocol: Yes   Plan:  1. No heparin bolus due to recent bleed and high risk of re-bleeding.  2. Start at lower heparin rate of 1300 units/hr - 6 hours post-operative (AET 1440) at 2100 PM tonight.  3. Heparin level in 6 hours (0300 AM)  4. Monitor CBC and patient for signs and symptoms of re-bleeding and contact MD if needed for further guidance.  5. Daily heparin level and CBC while on therapy.    Link Snuffer, PharmD, BCPS Clinical Pharmacist [phone] 12-Sep-2013,5:52 PM

## 2013-08-17 NOTE — Progress Notes (Signed)
eLink Physician-Brief Progress Note Patient Name: [name] [name] DOB: 03-Feb-1935 MRN: [id number]  Date of Service  08/27/2013   HPI/Events of Note   Positive troponin, not complaining of chest pain Shock state, CVP 2   eICU Interventions  D/c metoprolol Bolus 500cc NS EKG now      MCQUAID, DOUGLAS 08/03/2013, 11:49 PM

## 2013-08-17 NOTE — Anesthesia Postprocedure Evaluation (Signed)
  Anesthesia Post-op Note  Patient: [name] [name]  Procedure(s) Performed: Procedure(s): THROMBECTOMY FEMORAL ARTERY (Left) ENDARTERECTOMY FEMORAL with patch angioplasty using a 1cm by 6cm Vascuguard bovine patch (Left) INTRA OPERATIVE ARTERIOGRAM (Left)  Patient Location: PACU  Anesthesia Type:General  Level of Consciousness: awake, alert , oriented and patient cooperative  Airway and Oxygen Therapy: Patient Spontanous Breathing and Patient connected to nasal cannula oxygen  Post-op Pain: mild  Post-op Assessment: Post-op Vital signs reviewed, Patient's Cardiovascular Status Stable, Respiratory Function Stable, Patent Airway, No signs of Nausea or vomiting and Pain level controlled  Post-op Vital Signs: Reviewed and stable, much more stable on pressors  Complications: No apparent anesthesia complications

## 2013-08-17 NOTE — Anesthesia Preprocedure Evaluation (Addendum)
Anesthesia Evaluation  Patient identified by MRN, date of birth, ID band Patient awake    Reviewed: Allergy & Precautions, H&P , NPO status , Patient's Chart, lab work & pertinent test results, reviewed documented beta blocker date and time   History of Anesthesia Complications Negative for: history of anesthetic complications  Airway Mallampati: II TM Distance: >3 FB Neck ROM: Full    Dental  (+) Dental Advisory Given   Pulmonary sleep apnea and Continuous Positive Airway Pressure Ventilation , former smoker (quit 1963),  breath sounds clear to auscultation  Pulmonary exam normal       Cardiovascular hypertension, Pt. on medications and Pt. on home beta blockers + angina (last chest pain 3 weeks ago, rarely takes NTG) + CAD, + CABG, + Peripheral Vascular Disease and DVT (on xarelto) + dysrhythmias Atrial Fibrillation + Valvular Problems/Murmurs (mild AS, h/o AVR porcine) AS Rhythm:Regular Rate:Normal  3/14 ECHO: EF 50-55%, anterior hypokinesis, grade 1 diastolic dysfunction, mild AS with AVA 1.1 cm2   Neuro/Psych negative neurological ROS     GI/Hepatic Neg liver ROS, GERD-  Controlled,  Endo/Other  diabetes (glu 105), Type 2, Oral Hypoglycemic Agents  Renal/GU negative Renal ROS     Musculoskeletal   Abdominal   Peds  Hematology  (+) Blood dyscrasia (Hb 11.5), anemia ,   Anesthesia Other Findings   Reproductive/Obstetrics                       Anesthesia Physical Anesthesia Plan  ASA: III  Anesthesia Plan: General   Post-op Pain Management:    Induction: Intravenous  Airway Management Planned: Oral ETT  Additional Equipment:   Intra-op Plan:   Post-operative Plan: Extubation in OR  Informed Consent: I have reviewed the patients History and Physical, chart, labs and discussed the procedure including the risks, benefits and alternatives for the proposed anesthesia with the patient or  authorized representative who has indicated his/her understanding and acceptance.   Dental advisory given  Plan Discussed with: CRNA and Surgeon  Anesthesia Plan Comments: (Plan routine monitors, GETA Possible arterial access if need intra-op sampling )        Anesthesia Quick Evaluation

## 2013-08-17 NOTE — Progress Notes (Signed)
Delayed note:  Patient with ASCADz, HTN, DM, h/o DVT and PVD on Xarelto underwent emergent surgery for ischemic foot.  EBL approached 1500cc, was replaced with PRBCs intra-op.  Despite adeq volume resuscitation, patient required vasopressor support throughout the surgery.  Initially in PACU, patient had VSS, but then again required pressor BP support.  Discussed with Dr. Imogene Burn, who agreed with eval for MI, although patient has denied any chest pain and reports no complaints other than groin pain.  12 lead EKG ordered, troponin sent.  Will begin NTG and phenylephrine infusions and transport to ICU. Report given to ICU MD.  Sandford Craze, MD  15:45

## 2013-08-17 NOTE — Procedures (Signed)
CVP: Timeout, sterile prep, drape, FBP R neck.  Trendelenburg position.  1% lido local, finder and trocar RIJ 1st pass with US guidance.  2 lumen placed over J wire. Biopatch and sterile dressing on.  Patient tolerated well.  VSS.  Sandford Craze, MD  15:57-16:10

## 2013-08-17 NOTE — Interval H&P Note (Signed)
[facility]  History and Physical Update  The patient was interviewed and re-examined.  The patient's previous History and Physical has been reviewed and is unchanged.  The pt continues have motor and sensation in both feet.  The left foot is slightly warmer than previously.  Based on the CTA, the patient will minimally need a thromboembolectomy of left leg via femoral and likely popliteal approach.  This procedure was delay due to full anticoagulation with Xarelto, which has no antidote other than time.  The risk, benefits, and alternative for the procedure were discussed with the patient.  The patient is aware the risks include but are not limited to: bleeding, infection, myocardial infarction, stroke, limb loss, nerve damage, need for additional procedures in the future, wound complications, and inability to complete the procedure.  The patient is aware of these risks and agreed to proceed.   Leonides Sake, MD [facility] Office: [phone] Pager: [phone]  08/27/2013, 7:50 AM

## 2013-08-17 NOTE — OR Nursing (Signed)
Noted left great toe to be purple in color; black eschar noted; tender to touch.  Noted petechiae in top of left foot; warm to touch.  Noted sacral dressing to be in place and intact.  During foley insertion noted white yeasty discharge around tip of penis.  Cleaned with betadine solution prior to foley insertion per policy.

## 2013-08-17 NOTE — Progress Notes (Signed)
[facility] PROGRESS NOTE  [name] ZOX:[id number] DOB: 1934/12/11 DOA: 08/28/2013 PCP: Ignatius Specking., MD  Assessment/Plan:  1. Critical left lower limb ischemia - appreciate vascular surgery assistance, Pt is NPO, holding Xarelto per Dr. Imogene Burn, heparin to be started per vascular surgery, CTA worrisome for embolic phenomenon involving left common femoral and left below-knee popliteal arteries with extension to involve primarily the left peroneal artery as well as the right deep femoral artery and right posterior tibial arteries. As a discrete source of this distal embolism is not depicted on this examination further evaluation with cardiac echo (especially in light of known aortic valve replacement) may be performed as clinically indicated. Will order ECHO.  2. Diabetes Mellitus, type 2 - holding metformin, supplemental insulin as needed, monitoring blood glucose closely 3. Hypertension - controlled, resuming home medications 4. Dyslipidemia - continuing statin, check fasting lipid panel 5. History of DVT - pt had been on Xarelto for this 6. History of A Flutter, continue telemetry monitoring 7. History of Gout 8. Leukocytosis - check portable CXR, urinalysis 9. Hyponatremia - following BMP,check urine lytes 10. OSA - uses CPAP   Code Status: Full Code Family Communication: no family at bedside Disposition Plan: per vascular surgery   HPI/Subjective: Pt reports mild pain in left great toe.    Objective: Filed Vitals:   08/16/2013 0554  BP: 93/61  Pulse: 92  Temp: 98 F (36.7 C)  Resp: 18    Intake/Output Summary (Last 24 hours) at 08/16/2013 0732 Last data filed at 08/07/2013 0200  Gross per 24 hour  Intake      0 ml  Output    220 ml  Net   -220 ml   Filed Weights   08/22/2013 2307  Weight: 194 lb 10.7 oz (88.3 kg)    Exam:  General: NAD, resting comfortably in bed  Eyes: PEERLA EOMI  ENT: mucous membranes moist  Neck: supple w/o JVD  Cardiovascular: RRR  w/o MRG  Respiratory: CTA B  Abdomen: soft, nt, nd, bs+  Musculoskeletal: L great toe with ecchymosis and small area of black eschar, petechiae on dorsum of left foot, no ascending erythema.   Data Reviewed: Basic Metabolic Panel:  Recent Labs Lab 08/16/2013 0350  NA 131*  K 4.5  CL 97  CO2 22  GLUCOSE 104*  BUN 24*  CREATININE 1.05  CALCIUM 8.9   Liver Function Tests: No results found for this basename: AST, ALT, ALKPHOS, BILITOT, PROT, ALBUMIN,  in the last 168 hours No results found for this basename: LIPASE, AMYLASE,  in the last 168 hours No results found for this basename: AMMONIA,  in the last 168 hours CBC:  Recent Labs Lab 08/12/2013 0350  WBC 12.6*  HGB 11.5*  HCT 33.8*  MCV 84.9  PLT 277   Cardiac Enzymes: No results found for this basename: CKTOTAL, CKMB, CKMBINDEX, TROPONINI,  in the last 168 hours BNP (last 3 results) No results found for this basename: PROBNP,  in the last 8760 hours CBG:  Recent Labs Lab 08/20/2013 0553  GLUCAP 105*    No results found for this or any previous visit (from the past 240 hour(s)).   Studies: Ct Angio Ao+bifem W/cm &/or Wo/cm  08/30/2013   CLINICAL DATA:  Avascular necrosis of the left foot, left great toe is purple and red, history of diabetes and DVT  EXAM: CT ANGIOGRAPHY OF ABDOMINAL AORTA WITH ILIOFEMORAL RUNOFF  TECHNIQUE: Multidetector CT imaging of the abdomen, pelvis and lower extremities was  performed using the standard protocol during bolus administration of intravenous contrast. Multiplanar CT image reconstructions including MIPs were obtained to evaluate the vascular anatomy.  CONTRAST:  OMNIPAQUE IOHEXOL 350 MG/ML SOLN  COMPARISON:  Bilateral lower extremity venous Doppler ultrasound - 06/13/2013 ; CT abdomen pelvis - 03/18/2011  FINDINGS: Vascular Findings:  Abdominal aorta: Rather extensive primarily calcified atherosclerotic plaque within the abdominal aorta. There is mild ectasia of the infrarenal  abdominal aorta measuring approximately 2.6 cm (axial image 76, series 5, sagittal image 110, series 9). No abdominal aortic dissection or periaortic stranding.  Celiac artery: The origin of the celiac artery is noted to be mildly tortuous. There is a mixture of calcified and noncalcified plaque involving the origin and proximal aspect of the celiac artery, not definitely resulting in hemodynamically significant stenosis. Conventional branching pattern.  SMA: There is a mixture of calcified and noncalcified plaque involving the origin and main trunk of the SMA, not resulting in hemodynamically significant stenosis. Incidental note is made of a replaced right hepatic artery arising from the proximal SMA.  Right Renal artery: Solitary with early bifurcation. There is a mixture of calcified and noncalcified atherosclerotic plaque involving the origin and proximal aspect of the right renal artery, possibly approaching 50% luminal narrowing.  Left Renal artery: Solitary; there is eccentric primarily calcified atherosclerotic plaque involving the origin and proximal aspect of the left renal artery, not resulting in hemodynamically significant narrowing.  IMA: Widely patent.   ------------------------------------------------------------------------------------------------------  Right lower extremity:  Pelvic vasculature: There is a mixture of calcified and noncalcified plaque involving the right common iliac artery, not result in hemodynamically significant narrowing. There is mixed calcified and noncalcified atherosclerotic plaque within both the right external and internal iliac arteries, not resulting in hemodynamically significant narrowing.  Right common femoral artery: Minimal amount of eccentric atherosclerotic plaque, not resulting in hemodynamically significant narrowing.  Right deep femoral artery: There is a discrete filling defect within the proximal aspect of the right deep femoral artery (images 167, 175 and  184, series 5) with short segment occlusion extending to the level of the proximal/ mid thigh. There is reconstitution of the distal aspect of the right deep femoral artery via deep thigh collaterals.  Right superficial femoral artery: There is scattered minimal mixed calcified and noncalcified atherosclerotic plaque within in the proximal and distal aspects of the right superficial femoral artery, not resulting in hemodynamically significant stenosis.  Right popliteal artery: The above knee popliteal artery is degraded due to streak artifact from the right total knee replacement. The below-knee popliteal artery appears patent throughout its imaged course.  Right lower leg: There is a apparent nonocclusive embolism within in the proximal aspect of the right posterior tibial artery (representative images 328 and 331, series 5). Though the vessel remains patent to the level of the forefoot. The right anterior tibial and peroneal arteries are patent. A right-sided dorsalis pedis artery is patent to the level of the mid foot.   ------------------------------------------------------------------------------  Left lower extremity:  Pelvic vasculature: There is mixed calcified and noncalcified plaque within the left common, external and internal iliac artery is all of which are of normal caliber and free of hemodynamically significant narrowing.  Left common femoral artery: Occluded throughout its course with reconstitution via pelvic collateral flow.  Deep femoral artery: Widely patent throughout its course, though there does appear to be a suspected minimal amount of distal nonocclusive thrombus within the distal aspect of a anterior branch vessel of the deep femoral artery (image  208, series 5).  Left superficial femoral artery: Embolism within the left common femoral artery extends to involve the proximal aspect of the left superficial femoral artery with early reconstitution. There is a minimal amount of calcified and  noncalcified atherosclerotic plaque within the distal aspect of the left common femoral artery, not resulting in hemodynamically significant stenosis.  Left popliteal artery: The above knee popliteal artery is degraded due to streak artifact from the adjacent left total knee replacement. There is nonocclusive embolism seen throughout the image course of the left below-knee popliteal artery (image 303, series 5).  Left lower leg: There is nonocclusive thrombus which extends to involve the proximal aspect of the left peroneal artery (image 325). There is short segment occlusion of the proximal aspect of the left anterior tibial artery (image 346) with early reconstitution. The left posterior tibial artery is patent to the level of the forefoot. A patent left-sided dorsalis pedis artery is not identified.  Review of the MIP images confirms the above findings.   --------------------------------------------------------------------------------  Nonvascular Findings:  Evaluation of the abdominal organs is limited to the arterial phase of enhancement.  Normal hepatic contour. No discrete hyperenhancing hepatic lesions. The gallbladder is distended but otherwise normal. No discrete radiopaque gallstones. No ascites.  There is symmetric enhancement of the bilateral kidneys. There is an approximately 2.7 x 2.9 cm partially exophytic hypo attenuating (-8 Hounsfield unit) cyst arising from the mid aspect of the right kidney (image 69, series 5). There is an additional approximately 1.9 cm hypoattenuating (7 Hounsfield unit) left-sided renal cyst (image 79). Additional bilateral subcentimeter hypoattenuating renal lesions are too small to accurately characterize. There is a grossly unchanged approximate 1.4 x 1.5 cm nonobstructing stone within the mid aspect of the left kidney (coronal image 116, series 7). No urinary obstruction. Grossly symmetric likely age-related perinephric stranding. No stones are seen along the expected  course of either ureter or within the urinary bladder. Normal appearance of the urinary bladder given the. Distention. Normal appearance of the bilateral adrenal glands and pancreas. Likely normal early arterial phase appearance of the spleen with associated grossly unchanged cleft within its mid aspect. Incidental note is made of a prominent splenule within the splenic hilum. No perisplenic stranding.  Scattered colonic diverticulosis without evidence of diverticulitis. Postsurgical change of the cecum. The appendix is not visualized, presumably surgically absent. No evidence of enteric obstruction. No pneumoperitoneum, pneumatosis or portal venous gas.  No retroperitoneal, mesenteric, pelvic or inguinal lymphadenopathy.  Limited visualization a lower thorax demonstrates minimal subsegmental atelectasis within the imaged left lower lobe. No focal airspace opacities. No pleural effusion.  Cardiomegaly. Post median sternotomy and aortic valve replacement. No pericardial effusion.  No acute or aggressive osseous lesions. Stable sequela of L4-L5 ACDF with associated right-sided paraspinal fusion at this level. Bilateral facet degenerative change within the lower lumbar spine. Post bilateral total knee replacements including right-sided redo total knee replacement.  IMPRESSION: 1. Overall findings worrisome for embolic phenomenon involving left common femoral and left below-knee popliteal arteries with extension to involve primarily the left peroneal artery as well as the right deep femoral artery and right posterior tibial arteries. As a discrete source of this distal embolism is not depicted on this examination (in particular, no evidence of significant abdominal aortic aneurysm), further evaluation with cardiac echo (especially in light of known aortic valve replacement) may be performed as clinically indicated. 2. Rather extensive atherosclerotic plaque within a mildly ectatic abdominal aorta which measures  approximately 2.6 cm in maximum  diameter. 3. Right-sided nonobstructing nephrolithiasis. Critical Value/emergent results were called by telephone at the time of interpretation on 08/30/13 at 10:26 PM to Dr.ROBERT BEATON , who verbally acknowledged these results.   Electronically Signed   By: Simonne Come M.D.   On: 08-30-2013 22:29    Scheduled Meds: . allopurinol  100 mg Oral Daily  . aspirin EC  162 mg Oral Daily  . donepezil  10 mg Oral QHS  . FLUoxetine  20 mg Oral Daily  . insulin aspart  0-9 Units Subcutaneous Q4H  . LORazepam  1 mg Oral QHS  . metoprolol  75 mg Oral BID  . multivitamin with minerals  1 tablet Oral Daily  . omega-3 acid ethyl esters  1 g Oral Daily  . pantoprazole  40 mg Oral Daily  . potassium chloride SA  20 mEq Oral Daily  . predniSONE  10 mg Oral Q breakfast  . ramipril  10 mg Oral Daily  . simvastatin  20 mg Oral QPM  . sodium chloride  3 mL Intravenous Q12H  . tamsulosin  0.4 mg Oral Daily  . traMADol  50 mg Oral BID  . vitamin C  500 mg Oral Daily   Continuous Infusions:   Principal Problem:   Critical lower limb ischemia   [facility]  [facility] Pager [phone]. If 7PM-7AM, please contact night-coverage at [email or web address], password [facility] 08/07/2013, 7:32 AM  LOS: 1 day

## 2013-08-18 ENCOUNTER — Encounter (HOSPITAL_COMMUNITY): Payer: Self-pay | Admitting: Cardiology

## 2013-08-18 ENCOUNTER — Inpatient Hospital Stay (HOSPITAL_COMMUNITY): Payer: Medicare Other

## 2013-08-18 DIAGNOSIS — I2510 Atherosclerotic heart disease of native coronary artery without angina pectoris: Secondary | ICD-10-CM

## 2013-08-18 DIAGNOSIS — I999 Unspecified disorder of circulatory system: Secondary | ICD-10-CM

## 2013-08-18 DIAGNOSIS — I359 Nonrheumatic aortic valve disorder, unspecified: Secondary | ICD-10-CM

## 2013-08-18 DIAGNOSIS — I330 Acute and subacute infective endocarditis: Secondary | ICD-10-CM | POA: Diagnosis present

## 2013-08-18 DIAGNOSIS — I4892 Unspecified atrial flutter: Secondary | ICD-10-CM

## 2013-08-18 LAB — COMPREHENSIVE METABOLIC PANEL
ALT: <5 U/L (ref 0–53)
AST: 11 U/L (ref 0–37)
Albumin: 2.4 g/dL — ABNORMAL LOW (ref 3.5–5.2)
Alkaline Phosphatase: 48 U/L (ref 39–117)
BUN: 16 mg/dL (ref 6–23)
CO2: 20 mEq/L (ref 19–32)
Chloride: 101 mEq/L (ref 96–112)
Potassium: 4.1 mEq/L (ref 3.5–5.1)
Sodium: 133 mEq/L — ABNORMAL LOW (ref 135–145)
Total Bilirubin: 0.9 mg/dL (ref 0.3–1.2)

## 2013-08-18 LAB — GLUCOSE, CAPILLARY
Glucose-Capillary: 112 mg/dL — ABNORMAL HIGH (ref 70–99)
Glucose-Capillary: 101 mg/dL — ABNORMAL HIGH (ref 70–99)

## 2013-08-18 LAB — CK TOTAL AND CKMB (NOT AT ARMC)
CK, MB: 3.5 ng/mL (ref 0.3–4.0)
Relative Index: INVALID (ref 0.0–2.5)

## 2013-08-18 LAB — CBC
HCT: 27.7 % — ABNORMAL LOW (ref 39.0–52.0)
Hemoglobin: 9.8 g/dL — ABNORMAL LOW (ref 13.0–17.0)
Platelets: 210 10*3/uL (ref 150–400)
RDW: 14.9 % (ref 11.5–15.5)
WBC: 19.3 10*3/uL — ABNORMAL HIGH (ref 4.0–10.5)

## 2013-08-18 LAB — CORTISOL: Cortisol, Plasma: 20.2 ug/dL

## 2013-08-18 LAB — PROCALCITONIN: Procalcitonin: 0.31 ng/mL

## 2013-08-18 LAB — APTT: aPTT: 120 seconds — ABNORMAL HIGH (ref 24–37)

## 2013-08-18 MED ORDER — PIPERACILLIN-TAZOBACTAM 3.375 G IVPB 30 MIN
3.3750 g | Freq: Once | INTRAVENOUS | Status: AC
  Administered 2013-08-18: 3.375 g via INTRAVENOUS
  Filled 2013-08-18: qty 50

## 2013-08-18 MED ORDER — MORPHINE SULFATE 2 MG/ML IJ SOLN
2.0000 mg | INTRAMUSCULAR | Status: DC | PRN
  Administered 2013-08-18 – 2013-08-20 (×6): 2 mg via INTRAVENOUS
  Filled 2013-08-18 (×5): qty 1

## 2013-08-18 MED ORDER — CHLORHEXIDINE GLUCONATE 0.12 % MT SOLN
15.0000 mL | Freq: Two times a day (BID) | OROMUCOSAL | Status: DC
  Administered 2013-08-18 – 2013-08-23 (×11): 15 mL via OROMUCOSAL
  Filled 2013-08-18 (×11): qty 15

## 2013-08-18 MED ORDER — BIOTENE DRY MOUTH MT LIQD
15.0000 mL | Freq: Two times a day (BID) | OROMUCOSAL | Status: DC
  Administered 2013-08-18 – 2013-08-23 (×8): 15 mL via OROMUCOSAL

## 2013-08-18 MED ORDER — PANTOPRAZOLE SODIUM 40 MG IV SOLR
40.0000 mg | INTRAVENOUS | Status: DC
  Administered 2013-08-18 – 2013-08-19 (×2): 40 mg via INTRAVENOUS
  Filled 2013-08-18 (×3): qty 40

## 2013-08-18 MED ORDER — VANCOMYCIN HCL 10 G IV SOLR
1500.0000 mg | Freq: Once | INTRAVENOUS | Status: AC
  Administered 2013-08-18: 1500 mg via INTRAVENOUS
  Filled 2013-08-18: qty 1500

## 2013-08-18 MED ORDER — SODIUM CHLORIDE 0.9 % IV BOLUS (SEPSIS)
500.0000 mL | Freq: Once | INTRAVENOUS | Status: AC
  Administered 2013-08-18: 500 mL via INTRAVENOUS

## 2013-08-18 MED ORDER — PIPERACILLIN-TAZOBACTAM 3.375 G IVPB
3.3750 g | Freq: Three times a day (TID) | INTRAVENOUS | Status: DC
  Administered 2013-08-19: 3.375 g via INTRAVENOUS
  Filled 2013-08-18 (×3): qty 50

## 2013-08-18 MED ORDER — VANCOMYCIN HCL 10 G IV SOLR
1250.0000 mg | Freq: Two times a day (BID) | INTRAVENOUS | Status: DC
  Administered 2013-08-19 – 2013-08-25 (×14): 1250 mg via INTRAVENOUS
  Filled 2013-08-18 (×15): qty 1250

## 2013-08-18 NOTE — Progress Notes (Signed)
  Echocardiogram 2D Echocardiogram has been performed.  [name] [name] FRANCES 08/18/2013, 4:02 PM

## 2013-08-18 NOTE — Progress Notes (Addendum)
    Patient ID: [name] [name], male   DOB: 04/18/1928, 78 y.o.   MRN: [id number]  Consulted for Elevated Troponin in a patient with history of CAD-CABG & AVR (known RBBB, LAFB & 1st Deg AVB), Aflutter in the setting of hemorrhagic shock s/p complicated L  Iliofemoral thromboembolectomy & endarterectomy.    He remains critically ill, but is now awake & alert, answers ?s.  Moves all extremities.  BP ~90 systolic with HR in 120s, NSR.    Mild Troponin elevation is most likely Type 2 Myocardial Necrosis with supply vs. Demand ischemia in the setting of known CAD with Hemorrhagic shock (anemia, tachycardia & hypotension).  Currently denies CP or dyspnea.  Is already on full dose AC for h/o DVT, arterial thrombus & Aflutter.  Would hold off on ASA unless felt safe peri-op. Tachycardia is compensatory - would not use BB unless BP stable.  Echo ordered - will review, but with significant tachycardia & anemia, the results may not be very helpful.  Full note to follow.   For now, would simply recommend continued supportive care.  Would expect troponin level to return to baseline.  Would not plan invasive evaluation unless he has Angina Sx.  Currently not having either CP or Dyspnea, despite S Tachy in 120 bpm range.   Marykay Lex, MD  ADDENDUM:  Echo reviewed & discussed results with Dr. 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 Would check blood Cultures & consider checking empiric Abx.  Will review with TEE performing MDs to discuss timing & need for possible TEE. Would also consider ID consultation.

## 2013-08-18 NOTE — Consult Note (Signed)
PULMONARY  / CRITICAL CARE MEDICINE  Name: [name] [name] MRN: [id number] DOB: 05-04-1935    ADMISSION DATE:  Aug 30, 2013 CONSULTATION DATE:  08/31/2013   REFERRING MD :  DR. Imogene Burn  PCP - Dhruv Vyas  PRIMARY SERVICE:   CHIEF COMPLAINT:  Hypotension/shock  post op   BRIEF PATIENT DESCRIPTION: 78 yo with CAD, DM-2, AF on xarelto with hemorrhagic shock after emergent Left iliofemoropopliteal thromboembolectomy requiring Patch angioplasty of left iliofemoral arterial segment for acute left leg ischemia . Op EBL ~1500cc, transfused 4 u PRBC and given albumin x 2  Intraoperatively. Started on pressors in OR .  PCCM asked to consult for hypotension [name] [name] 11/15    SIGNIFICANT EVENTS / STUDIES:  11/14 CT angio bifem >embolism left common femoral , left popliteal arteries w/ extension to left peroneal artery as well as right deep femoral artery and post tibial arteries.  11/15 OR for emergent left iniofemoropopliteal thromboembolectomy /patch angioplasty   LINES / TUBES: 11/15 R. IJ >>  CULTURES:   ANTIBIOTICS: 11/15 Zinacef post op    SUBJECTIVE:  B/p remains low overnight despite fluid challenges, remains on pressor support CVP low  Remains alert/ and f/c  Troponin positive , on hep drip   VITAL SIGNS: Temp:  [96.8 F (36 C)-98.8 F (37.1 C)] 98.8 F (37.1 C) (11/16 0801) Pulse Rate:  [84-143] 94 (11/16 0730) Resp:  [16-32] 22 (11/16 0730) BP: (78-136)/(53-97) 95/60 mmHg (11/16 0730) SpO2:  [91 %-100 %] 99 % (11/16 0730) HEMODYNAMICS: CVP:  [1 mmHg-5 mmHg] 3 mmHg VENTILATOR SETTINGS:   INTAKE / OUTPUT: Intake/Output     11/15 0701 - 11/16 0700 11/16 0701 - 11/17 0700   I.V. (mL/kg) 6663.1 (75.5)    Blood 1300    IV Piggyback 1050    Total Intake(mL/kg) 9013.1 (102.1)    Urine (mL/kg/hr) 1175 (0.6)    Drains 20 (0)    Blood 1500 (0.7)    Total Output 2695     Net +6318.1            PHYSICAL EXAMINATION: General:  Obese male ,alert  Neuro:  Alert, f/c  HEENT:   nml  Cardiovascular:  ST , no edema . CVP 2 , DP/PT pulses +bilaterally w/ doppler  Lungs:  Diminshed BS in bases  Abdomen:  Soft NT BS hypoactive  Musculoskeletal:  intact Skin:  Discoloration along left foot , pale   LABS:  CBC  Recent Labs Lab 08/26/2013 0350  08/24/2013 1504 08/04/2013 2050 08/18/13 0300  WBC 12.6*  --   --  18.6* 19.3*  HGB 11.5*  < > 9.9* 9.9* 9.8*  HCT 33.8*  < > 29.0* 28.1* 27.7*  PLT 277  --   --  174 210  < > = values in this interval not displayed. Coag's  Recent Labs Lab 08/18/13 0551  APTT 120*   BMET  Recent Labs Lab 08/04/2013 0350  08/14/2013 1406 08/09/2013 1504 08/18/13 0300  NA 131*  < > 135 135 133*  K 4.5  < > 5.4* 4.7 4.1  CL 97  --   --   --  101  CO2 22  --   --   --  20  BUN 24*  --   --   --  16  CREATININE 1.05  --   --   --  0.96  GLUCOSE 104*  < > 188* 190* 109*  < > = values in this interval not displayed. Electrolytes  Recent Labs Lab  08/05/2013 0350 08/18/13 0300  CALCIUM 8.9 8.1*   Sepsis Markers  Recent Labs Lab 08/22/2013 1747 08/12/2013 1830 08/18/13 0300  LATICACIDVEN  --  1.8  --   PROCALCITON 0.17  --  0.31   ABG No results found for this basename: PHART, PCO2ART, PO2ART,  in the last 168 hours Liver Enzymes  Recent Labs Lab 08/18/13 0300  AST 11  ALT <5  ALKPHOS 48  BILITOT 0.9  ALBUMIN 2.4*   Cardiac Enzymes  Recent Labs Lab 08/27/2013 1528 08/24/2013 2217 08/18/13 0300  TROPONINI <0.30 0.38* 0.49*   Glucose  Recent Labs Lab 08/16/2013 0553 08/22/2013 1441 08/22/2013 1955 08/18/13 0017 08/18/13 0428 08/18/13 0759  GLUCAP 105* 184* 179* 118* 125* 112*    Imaging Dg Chest Port 1 View  08/18/2013   CLINICAL DATA:  Dyspnea  EXAM: PORTABLE CHEST - 1 VIEW  COMPARISON:  08/13/2013  FINDINGS: Changes from cardiac surgery are stable. No pneumothorax. No pleural effusion.  No lung consolidation or pulmonary edema.  Right internal jugular central venous line is stable and well positioned.   IMPRESSION: No acute cardiopulmonary disease. No change from the previous day's study.   Electronically Signed   By: Amie Portland M.D.   On: 08/18/2013 07:57   Dg Chest Port 1 View  08/22/2013   CLINICAL DATA:  Central line placement, leukocytosis  EXAM: PORTABLE CHEST - 1 VIEW  COMPARISON:  08/06/2013  FINDINGS: Grossly unchanged enlarged cardiac silhouette and mediastinal contours post median sternotomy and CABG. Interval placement of right jugular approach central venous catheter with tip projected over the superior cavoatrial junction. No pneumothorax. Mild pulmonary venous congestion without frank evidence of edema. Minimal left basilar/retrocardiac atelectasis. No pleural effusion. Unchanged bones.  IMPRESSION: 1. Right jugular approach central venous catheter tip projects over the superior cavoatrial junction. No pneumothorax. 2. Pulmonary venous congestion without frank evidence of edema. 3. Minimal left basilar atelectasis.   Electronically Signed   By: Simonne Come M.D.   On: 08/04/2013 17:11     CXR: RIJ in good position, No acute cardiopulmonary disease   ASSESSMENT / PLAN:  PULMONARY A:  Hx of OSA  P:   Titrate O2 for sat >90% Nocturnal CPAP   CARDIOVASCULAR A: Hypotension /Hypovolemic Shock -pressor dependent  CAD /Atrial fib on Xarelto prior to admission  PVD w/ critical left limb ischemia s/p embolectomy/angioplasty NSTEMI vs demand w/ postive enzymes  11/16 : lactate 1.8, PCT 0.17  P:   Titrate Neo for MAP >65     2 D echo pending  Cont Hep Drip  Vascular following  Cards consult    RENAL A:  Renal Insuffiency  11/16 Decreased UOP , scr  P:   Monitor  Check foley /flush   GASTROINTESTINAL A:   P:   Monitor  PPI  NPO for now -low threshold to evaluate swallow  HEMATOLOGIC A:  Anemia -post op blood loss  S/p 4 u PRBC and 2 albumin during surgery 11/15   P:  Monitor cbc closely , transfuse for Hb 8 or lower given CAD  INFECTIOUS 11/15 PCT low  A:    P:   Tr temp and wbc     ENDOCRINE A:  DM  P:   ICU SSI   NEUROLOGIC A:  Dementia  P:   Monitor  Restart po meds when b/p improves   TODAY'S SUMMARY: Hemorrhagic shock post femoral artery surgery with 4U transfused , now on neogtt being titrated, remains dry with low CVP -  troponin bump c/w NSTEMI vs demand Given overall deterioration & course over past few months, goals of care discussion appropriate prior to discharge planning.   PARRETT,TAMMY NP-C   I have personally obtained a history, examined the patient, evaluated laboratory and imaging results, formulated the assessment and plan and placed orders. CRITICAL CARE: The patient is critically ill with multiple organ systems failure and requires high complexity decision making for assessment and support, frequent evaluation and titration of therapies, application of advanced monitoring technologies and extensive interpretation of multiple databases. Critical Care Time devoted to patient care services described in this note is 31 minutes.   Oretha Milch  Pulmonary and Critical Care Medicine [facility] Pager: [phone]  08/18/2013, 8:58 AM

## 2013-08-18 NOTE — Progress Notes (Signed)
ANTIBIOTIC CONSULT NOTE - INITIAL  Pharmacy Consult for Vancomycin + Zosyn Indication: rule out endocarditis  Allergies  Allergen Reactions  . Bee Venom Anaphylaxis   Patient Measurements: Height: 5\' 11"  (180.3 cm) Weight: 194 lb 10.7 oz (88.3 kg) IBW/kg (Calculated) : 75.3 Vital Signs: Temp: 98.7 F (37.1 C) (11/16 1231) Temp src: Oral (11/16 1231) BP: 88/66 mmHg (11/16 1500) Pulse Rate: 118 (11/16 1500) Intake/Output from previous day: 11/15 0701 - 11/16 0700 In: 9123.2 [I.V.:6773.2; Blood:1300; IV Piggyback:1050] Out: 2695 [Urine:1175; Drains:20; Blood:1500] Intake/Output from this shift: Total I/O In: 996.7 [I.V.:966.7; Other:30] Out: 550 [Urine:530; Drains:20]  Labs:  Recent Labs  21-Aug-2013 0350  08/19/2013 1504 Aug 21, 2013 2050 08/18/13 0300  WBC 12.6*  --   --  18.6* 19.3*  HGB 11.5*  < > 9.9* 9.9* 9.8*  PLT 277  --   --  174 210  CREATININE 1.05  --   --   --  0.96  < > = values in this interval not displayed. Estimated Creatinine Clearance: 69.7 ml/min (by C-G formula based on Cr of 0.96).  Microbiology: No results found for this or any previous visit (from the past 720 hour(s)).  Medical History: Past Medical History  Diagnosis Date  . Coronary artery disease 2000    s/p CABG x 4  . Hypertension   . Hyperlipidemia   . S/P CABG x 4   . Diabetes mellitus without complication 2000  . GERD (gastroesophageal reflux disease)   . History of kidney stones   . History of gout   . Sleep apnea     USES C-PAP  . Cyst of eye     X3 CYSTS in left eyeHX OF LOSS OF VISION - RESOLVED AT PRESENT  . Pneumonia     hx of  . Arthritis   . Still's disease 1997    in remission  . Complication of anesthesia     can not pee after surgery  . H/O Aortic stenosis, severe 2009    s/p AVR; Echo 12/2012 - well seated valve.  EF ~50-55%, mild Ant-septal HK.  Marland Kitchen History of embolic stroke without residual deficits     Noted on MRI 05/2013  . History of DVT (deep vein  thrombosis)   . Paroxysmal atrial flutter 04/10/2013    was on Xarelto   Assessment: 78 YOM with leukocytosis and possible vegetation on ECHO to start IV vancomycin and Zosyn. SCr 0.96/estCrCl~65-49mL/min. Afebrile. Patient received cefuroxime post-operative x2 doses.  Blood cultures sent today.   Goal of Therapy:  Vancomycin trough level 15-20 mcg/ml  Plan:  1. Zosyn 3.375g IV over x1, then 3.375g IV q8h- each dose over 4 hours.  2. Vancomycin 1500 mg IV x1, then 1250mg  IV q12h.  3. Vancomycin trough at Css.  4. Follow-up blood cultures and clinical status.   Link Snuffer, PharmD, BCPS Clinical Pharmacist [phone] 08/18/2013,6:57 PM

## 2013-08-18 NOTE — Progress Notes (Signed)
ANTICOAGULATION CONSULT NOTE   Pharmacy Consult for Heparin Indication: DVT  Allergies  Allergen Reactions  . Bee Venom Anaphylaxis   Patient Measurements: Height: 5\' 11"  (180.3 cm) Weight: 194 lb 10.7 oz (88.3 kg) IBW/kg (Calculated) : 75.3 Heparin Dosing Weight: 88.3 kg Vital Signs: Temp: 97.4 F (36.3 C) (11/16 0000) Temp src: Oral (11/16 0000) BP: 102/62 mmHg (11/16 0600) Pulse Rate: 115 (11/16 0600) Labs:  Recent Labs  08/08/2013 0350  08/05/2013 1504 08/07/2013 1528 08/07/2013 2050 08/18/2013 2217 08/18/13 0300 08/18/13 0400 08/18/13 0551  HGB 11.5*  < > 9.9*  --  9.9*  --  9.8*  --   --   HCT 33.8*  < > 29.0*  --  28.1*  --  27.7*  --   --   PLT 277  --   --   --  174  --  210  --   --   APTT  --   --   --   --   --   --   --   --  120*  HEPARINUNFRC  --   --   --   --   --   --   --  0.88*  --   CREATININE 1.05  --   --   --   --   --  0.96  --   --   CKTOTAL  --   --   --  14  --  19 22  --   --   CKMB  --   --   --  2.1  --  3.1 3.5  --   --   TROPONINI  --   --   --  <0.30  --  0.38* 0.49*  --   --   < > = values in this interval not displayed.  Estimated Creatinine Clearance: 69.7 ml/min (by C-G formula based on Cr of 0.96).  Assessment: 78 yo male with DVT s/p thrombectomy for heparin  Goal of Therapy:  Heparin level 0.3-0.7 units/ml Monitor platelets by anticoagulation protocol: Yes   Plan:  Decrease heparin 1100 units/hr Check heparin level in 8 hours.  Geannie Risen, PharmD, BCPS  08/18/2013,6:29 AM

## 2013-08-18 NOTE — Progress Notes (Signed)
PT Cancellation Note  Patient Details Name: [name] [name] MRN: [id number] DOB: 12-22-1934   Cancelled Treatment:    Reason Eval/Treat Not Completed: Medical issues which prohibited therapy  Positive troponin; will hold PT eval today, and check back on pt tomorrow for appropriateness of gently mobilizing;  Thank you,  Van Clines, [place] [phone]    Van Clines [facility] 08/18/2013, 8:13 AM

## 2013-08-18 NOTE — Consult Note (Signed)
CARDIOLOGY CONSULTATION NOTE.  NAME:  [name] [name]   MRN: [id number] DOB:  October 25, 1934   ADMIT DATE: 2013-08-31  Reason for Consult: Postitive Troponin Level  Requesting Physician: Leonides Sake  Primary Cardiologist: Jonette Eva MD  HPI: This is a 78 y.o. male with a past medical history significant for coronary artery disease status post CABG in 2000 followed by bioprosthetic aortic valve replacement in 2009. He also has a history of paroxysmal atrial flutter/fibrillation as well as history of DVT. He's been on Xarelto for that in the past. Part of his recent evaluations demonstrated that he has had a stroke in the past as well. The remainder of medical issues as noted below. He was transferred down to [facility] after presenting to an outside hospital with left great toe discoloration following a two-week history of a nonhealing injury to the great toe the left foot. Upon arrival to the emergency room the toe was purple and cool. A CT exam was performed which showed possible thromboembolism in the setting of chronic atherosclerosis in the left iliofemoral artery. The procedure was postponed for at least one day to allow for Xarelto is clear the system. He was then taken to the operating room where he underwent thromboembolectomy iliofemoral popliteal artery as well as endarterectomy of the of the left iliofemoral artery with patch angioplasty and repair of the profunda artery on the left. During the procedure he was found to have a significant blood loss of roughly 1.5 L. He says will he developed hemorrhagic shock requiring pressor support. He was given 4 units packed red blood cells as well as albumin and still remained hypotensive and tachycardic in sinus tachycardia.  Due to his significant cardiac history, cardiac enzymes were checked and showed mild elevation of troponin. We were counseled to assist with the cardiac findings. Upon my evaluation, the patient was somewhat  lethargic, albeit awake and alert. He answered questions appropriately and followed commands. He denied any chest tightness or pressure as well as any dyspnea. He has not had any chest tightness pressure or dyspnea with rest or exertion prior to this hospitalization. Cardiac ROS: ; pertinent negatives - chest pain, chest pressure/discomfort, dyspnea, exertional chest pressure/discomfort, irregular heart beat, orthopnea, palpitations, paroxysmal nocturnal dyspnea, syncope and tachypnea Cardiac risk factors advanced age (older than 6 for men, 77 for women), diabetes mellitus, dyslipidemia, male gender, smoking/ tobacco exposure and Known coronary artery disease history and stroke  PMHx:  CARDIAC HISTORY: Results in below cardiac catheterization, echocardiogram and Stress Myoview Echo:  12/25/2012: Low Normal contractility ; EF 50-55% with dyskinetic septum, grade 1 diastolic function. Mild valvular stenosis of the bioprosthetic valve. Mild MR. Moderate to severe left atrial dilation.  Past Medical History  Diagnosis Date  . Coronary artery disease 2000    s/p CABG x 4  . Hypertension   . Hyperlipidemia   . S/P CABG x 4   . Diabetes mellitus without complication 2000  . GERD (gastroesophageal reflux disease)   . History of kidney stones   . History of gout   . Sleep apnea     USES C-PAP  . Cyst of eye     X3 CYSTS in left eyeHX OF LOSS OF VISION - RESOLVED AT PRESENT  . Pneumonia     hx of  . Arthritis   . Still's disease 1997    in remission  . Complication of anesthesia     can not pee after surgery  . H/O Aortic stenosis,  severe 2009    s/p AVR; Echo 12/2012 - well seated valve.  EF ~50-55%, mild Ant-septal HK.  Marland Kitchen History of embolic stroke without residual deficits     Noted on MRI 05/2013  . History of DVT (deep vein thrombosis)   . Paroxysmal atrial flutter 04/10/2013    was on Xarelto   Past Surgical History  Procedure Laterality Date  . Joint replacement  1996/2005    bil   . Lung mass  1965    benign mass removed  . Colon resection      benign mass  . Hiatal hernia repair  2000  . Aortic valve replacement  2009  . Back surgery  2011  . Coronary artery bypass graft  2000    4 VESSELLS  . Lithotripsy    . Tonsillectomy  at 78 years old  . Hammer toe surgery Right     x2  . Bunionectomy Left   . Knee arthroscopy Right   . Total knee revision Right 04/10/2013    Procedure: RIGHT TOTAL KNEE REVISION;  Surgeon: Loanne Drilling, MD;  Location: [facility] ORS;  Service: Orthopedics;  Laterality: Right;  . Cardioversion N/A 04/12/2013    Procedure: CARDIOVERSION;  Surgeon: Chrystie Nose, MD;  Location: [facility] ENDOSCOPY;  Service: Cardiovascular;  Laterality: N/A;  . Nm myoview ltd  04/2012    Lexiscan: No evidence of Ischemia   FAMHx: History reviewed. No pertinent family history. essentially noncontributory based on his age and risk factors.  SOCHx:  reports that he quit smoking about 51 years ago. His smoking use included Cigarettes. He smoked 0.00 packs per day for 5 years. He has never used smokeless tobacco. He reports that he drinks alcohol. He reports that he does not use illicit drugs.  ALLERGIES: Allergies  Allergen Reactions  . Bee Venom Anaphylaxis   ROS: A comprehensive review of systems was negative except for: Leg/foot pain prior to arrival no PND, orthopnea. No palpitations or rapid heart beats. No recent TIA or amaurosis fugax symptoms. No melena, hematochezia or hematuria.  HOME MEDICATIONS: Prescriptions prior to admission  Medication Sig Dispense Refill  . atorvastatin (LIPITOR) 10 MG tablet       . cephALEXin (KEFLEX) 500 MG capsule       . LORazepam (ATIVAN) 1 MG tablet Take 1 mg by mouth at bedtime.      . metFORMIN (GLUCOPHAGE) 500 MG tablet Take 500 mg by mouth 2 (two) times daily with a meal.      . metoprolol (LOPRESSOR) 50 MG tablet Take 50 mg by mouth 2 (two) times daily.      . Rivaroxaban (XARELTO) 20 MG TABS Take 1 tablet (20 mg  total) by mouth daily with breakfast.  21 tablet  1  . Tamsulosin HCl (FLOMAX) 0.4 MG CAPS Take 0.4 mg by mouth daily.      Marland Kitchen torsemide (DEMADEX) 20 MG tablet Take 20 mg by mouth daily.       Marland Kitchen oxyCODONE (OXY IR/ROXICODONE) 5 MG immediate release tablet Take 1-2 tablets (5-10 mg total) by mouth every 3 (three) hours as needed.  80 tablet  0    HOSPITAL MEDICATIONS: Current Facility-Administered Medications  Medication Dose Route Frequency Provider Last Rate Last Dose  . 0.9 %  sodium chloride infusion   Intravenous Continuous Fransisco Hertz, MD 75 mL/hr at 08/22/13 2000    . 0.9 %  sodium chloride infusion   Intravenous Continuous Storm Frisk, MD      .  acetaminophen (TYLENOL) tablet 325-650 mg  325-650 mg Oral Q4H PRN Fransisco Hertz, MD       Or  . acetaminophen (TYLENOL) suppository 325-650 mg  325-650 mg Rectal Q4H PRN Fransisco Hertz, MD      . alum & mag hydroxide-simeth (MAALOX/MYLANTA) 200-200-20 MG/5ML suspension 15-30 mL  15-30 mL Oral Q2H PRN Fransisco Hertz, MD      . antiseptic oral rinse (BIOTENE) solution 15 mL  15 mL Mouth Rinse q12n4p Oretha Milch, MD   15 mL at 08/18/13 1600  . atorvastatin (LIPITOR) tablet 10 mg  10 mg Oral q1800 Fransisco Hertz, MD      . bisacodyl (DULCOLAX) suppository 10 mg  10 mg Rectal Daily PRN Fransisco Hertz, MD      . chlorhexidine (PERIDEX) 0.12 % solution 15 mL  15 mL Mouth Rinse BID Oretha Milch, MD   15 mL at 08/18/13 2021  . guaiFENesin-dextromethorphan (ROBITUSSIN DM) 100-10 MG/5ML syrup 15 mL  15 mL Oral Q4H PRN Fransisco Hertz, MD      . heparin ADULT infusion 100 units/mL (25000 units/250 mL)  1,100 Units/hr Intravenous Continuous Storm Frisk, MD 11 mL/hr at 08/18/13 2200 1,100 Units/hr at 08/18/13 2200  . insulin aspart (novoLOG) injection 0-9 Units  0-9 Units Subcutaneous Q4H Hillary Bow, DO   1 Units at 08/18/13 0430  . morphine 2 MG/ML injection 2 mg  2 mg Intravenous Q1H PRN Oretha Milch, MD   2 mg at 08/18/13 1027  . ondansetron  (ZOFRAN) injection 4 mg  4 mg Intravenous Q6H PRN Fransisco Hertz, MD      . oxyCODONE-acetaminophen (PERCOCET/ROXICET) 5-325 MG per tablet 1-2 tablet  1-2 tablet Oral Q4H PRN Fransisco Hertz, MD      . pantoprazole (PROTONIX) injection 40 mg  40 mg Intravenous Q24H Oretha Milch, MD   40 mg at 08/18/13 1805  . phenol (CHLORASEPTIC) mouth spray 1 spray  1 spray Mouth/Throat PRN Fransisco Hertz, MD   1 spray at 08/18/13 1757  . phenylephrine (NEO-SYNEPHRINE) 40 mg in dextrose 5 % 250 mL infusion  30-200 mcg/min Intravenous Titrated Storm Frisk, MD 16.9 mL/hr at 08/18/13 2200 45 mcg/min at 08/18/13 2200  . [START ON 08/19/2013] piperacillin-tazobactam (ZOSYN) IVPB 3.375 g  3.375 g Intravenous Q8H [address] [place], [facility]      . senna-docusate (Senokot-S) tablet 1 tablet  1 tablet Oral QHS PRN Fransisco Hertz, MD      . sodium chloride 0.9 % injection 3 mL  3 mL Intravenous Q12H Hillary Bow, DO   3 mL at 08/18/13 0936  . [START ON 08/19/2013] vancomycin (VANCOCIN) 1,250 mg in sodium chloride 0.9 % 250 mL IVPB  1,250 mg Intravenous Q12H Gala Lewandowsky [place], RPH       VITALS: Blood pressure 95/62, pulse 107, temperature 98.6 F (37 C), temperature source Oral, resp. rate 22, height 5\' 11"  (1.803 m), weight 194 lb 10.7 oz (88.3 kg), SpO2 97.00%.  PHYSICAL EXAM: General appearance: Awake, but mildly lethargic he does open his eyes respond to command. He nods his head and says yes and no to questions. He follows commands. Neck: supple, symmetrical, trachea midline, thyroid not enlarged, symmetric, no tenderness/mass/nodules and Hyperdynamic jugular pulsations and carotid pulsations. It is lying supine it was not able to determine JVP. no clear bruit; right IJ line in place Lungs: Mildly decreased basilar breath sounds; otherwise weak effort, but no  rales/ rhonchi. Heart: Tachycardic, with distant heart sounds. It does appear to be a harsh systolic ejection murmur heard at the base radiating to carotids. No  heaves or thrills a nondisplaced PMI. No rubs or gallops Abdomen: soft, non-tender; bowel sounds normal; no masses,  no organomegaly Extremities: No significant edema. The left foot does seem somewhat mottled appearance but there is pulse noted in the DP and PT pulses but faint. The left great toe is purple. Pulses: He does have somewhat diminished as of the left foot, but they are palpable. 2+ DP on the right. Skin: Left foot is mottled, with purple toe. Otherwise no significant rashes or lesions. Neurologic: Mental status: alertness: lethargic, orientation: date, person, city, president Cranial nerves: normal Motor: He moves all extremities, wiggles feet and toes  LABS: Results for orders placed during the hospital encounter of 08-27-13 (from the past 24 hour(s))  GLUCOSE, CAPILLARY     Status: Abnormal   Collection Time    08/18/13 12:17 AM      Result Value Range   Glucose-Capillary 118 (*) 70 - 99 mg/dL   Comment 1 Documented in Chart     Comment 2 Notify RN    TROPONIN I     Status: Abnormal   Collection Time    08/18/13  3:00 AM      Result Value Range   Troponin I 0.49 (*) <0.30 ng/mL  CK TOTAL AND CKMB     Status: None   Collection Time    08/18/13  3:00 AM      Result Value Range   Total CK 22  7 - 232 U/L   CK, MB 3.5  0.3 - 4.0 ng/mL   Relative Index RELATIVE INDEX IS INVALID  0.0 - 2.5  CBC     Status: Abnormal   Collection Time    08/18/13  3:00 AM      Result Value Range   WBC 19.3 (*) 4.0 - 10.5 K/uL   RBC 3.26 (*) 4.22 - 5.81 MIL/uL   Hemoglobin 9.8 (*) 13.0 - 17.0 g/dL   HCT 09.8 (*) 11.9 - 14.7 %   MCV 85.0  78.0 - 100.0 fL   MCH 30.1  26.0 - 34.0 pg   MCHC 35.4  30.0 - 36.0 g/dL   RDW 82.9  56.2 - 13.0 %   Platelets 210  150 - 400 K/uL  COMPREHENSIVE METABOLIC PANEL     Status: Abnormal   Collection Time    08/18/13  3:00 AM      Result Value Range   Sodium 133 (*) 135 - 145 mEq/L   Potassium 4.1  3.5 - 5.1 mEq/L   Chloride 101  96 - 112 mEq/L    CO2 20  19 - 32 mEq/L   Glucose, Bld 109 (*) 70 - 99 mg/dL   BUN 16  6 - 23 mg/dL   Creatinine, Ser 8.65  0.50 - 1.35 mg/dL   Calcium 8.1 (*) 8.4 - 10.5 mg/dL   Total Protein 5.5 (*) 6.0 - 8.3 g/dL   Albumin 2.4 (*) 3.5 - 5.2 g/dL   AST 11  0 - 37 U/L   ALT <5  0 - 53 U/L   Alkaline Phosphatase 48  39 - 117 U/L   Total Bilirubin 0.9  0.3 - 1.2 mg/dL   GFR calc non Af Amer 79 (*) >90 mL/min   GFR calc Af Amer >90  >90 mL/min  PROCALCITONIN     Status:  None   Collection Time    08/18/13  3:00 AM      Result Value Range   Procalcitonin 0.31    HEPARIN LEVEL (UNFRACTIONATED)     Status: Abnormal   Collection Time    08/18/13  4:00 AM      Result Value Range   Heparin Unfractionated 0.88 (*) 0.30 - 0.70 IU/mL  GLUCOSE, CAPILLARY     Status: Abnormal   Collection Time    08/18/13  4:28 AM      Result Value Range   Glucose-Capillary 125 (*) 70 - 99 mg/dL   Comment 1 Documented in Chart     Comment 2 Notify RN    APTT     Status: Abnormal   Collection Time    08/18/13  5:51 AM      Result Value Range   aPTT 120 (*) 24 - 37 seconds  GLUCOSE, CAPILLARY     Status: Abnormal   Collection Time    08/18/13  7:59 AM      Result Value Range   Glucose-Capillary 112 (*) 70 - 99 mg/dL   Comment 1 Notify RN    GLUCOSE, CAPILLARY     Status: Abnormal   Collection Time    08/18/13 12:30 PM      Result Value Range   Glucose-Capillary 110 (*) 70 - 99 mg/dL   Comment 1 Notify RN    HEPARIN LEVEL (UNFRACTIONATED)     Status: None   Collection Time    08/18/13  1:00 PM      Result Value Range   Heparin Unfractionated 0.39  0.30 - 0.70 IU/mL  GLUCOSE, CAPILLARY     Status: Abnormal   Collection Time    08/18/13  4:41 PM      Result Value Range   Glucose-Capillary 113 (*) 70 - 99 mg/dL   Comment 1 Notify RN    GLUCOSE, CAPILLARY     Status: Abnormal   Collection Time    08/18/13  7:40 PM      Result Value Range   Glucose-Capillary 101 (*) 70 - 99 mg/dL   Comment 1 Documented in  Chart     Comment 2 Notify RN      IMAGING:  CTA:  bifem >embolism left common femoral , left popliteal arteries w/ extension to left peroneal artery as well as right deep femoral artery and post tibial arteries  CXR: No acutre CP process  Echo: Personally reviewed / read: most notable - ~mod AorticStenosis with what appears to be a hypermobile mass/vegetation on the aortic aspect.  Normal with septal paradox & inferior-inferoseptal hypokinesis.   Op Note Reviewed & details discussed with Dr. Imogene Burn -- thromboembolic appearance of removed material.  IMPRESSION: Principal Problem:   Critical lower limb ischemia Active Problems:   Hemorrhagic shock   Acute blood loss anemia   CAD in native artery - s/p CABG in 2000   Aortic valve vegetation   S/P CABG (coronary artery bypass graft)   OSA on CPAP   Paroxysmal atrial flutter   Consulted for Elevated Troponin in a patient with history of CAD-CABG & AVR (known RBBB, LAFB & 1st Deg AVB), Aflutter in the setting of hemorrhagic shock s/p complicated L Iliofemoral thromboembolectomy & endarterectomy.  He remains critically ill, but is now awake & alert, answers ?s. Moves all extremities. BP ~90 systolic with HR in 120s, NSR.  Echo ordered -  See full report, but very concerning for aortic vegetation  as the nidus for the thromboembolic material    Mild Troponin elevation is most likely Type 2 Myocardial Necrosis with supply vs. Demand ischemia in the setting of known CAD with Hemorrhagic shock (anemia, tachycardia & hypotension). Currently denies CP or dyspnea.    He is already on full dose AC for h/o DVT, arterial thrombus & Aflutter. Would hold off on ASA unless felt safe peri-op.   Tachycardia is compensatory - would not use BB unless BP stable.   For now, would simply recommend continued supportive care. Would expect troponin level to return to baseline. Would not plan invasive evaluation unless he has Angina Sx. Currently not having  either CP or Dyspnea, despite S Tachy in 120 bpm range.   Echo reviewed & discussed results with Dr. 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   Would check blood Cultures & consider checking empiric Abx. Will review with TEE performing MDs to discuss timing & need for possible TEE.   Would also consider ID consultation.   [facility] will continue to follow along & be of assistance as needed.  Will work to ensure TEE is done if felt necessary on review.  Time Spent Directly with Patient: 40 minutes  Zian Mohamed W, M.D., M.S. [facility] [address] [place], [place]  [address]  [phone]  08/18/2013 11:08 PM

## 2013-08-18 NOTE — Progress Notes (Signed)
Placed patient on CPAP via auto-mode. Sp02=95%

## 2013-08-18 NOTE — Progress Notes (Signed)
ANTICOAGULATION CONSULT NOTE - Follow Up Consult  Pharmacy Consult for heparin Indication: hx DVT  Allergies  Allergen Reactions  . Bee Venom Anaphylaxis    Patient Measurements: Height: 5\' 11"  (180.3 cm) Weight: 194 lb 10.7 oz (88.3 kg) IBW/kg (Calculated) : 75.3 Heparin Dosing Weight: 88 kg  Vital Signs: Temp: 98.7 F (37.1 C) (11/16 1231) Temp src: Oral (11/16 1231) BP: 95/60 mmHg (11/16 0730) Pulse Rate: 94 (11/16 0730)  Labs:  Recent Labs  Sep 05, 2013 0350  09/05/13 1504 Sep 05, 2013 1528 2013/09/05 2050 2013-09-05 2217 08/18/13 0300 08/18/13 0400 08/18/13 0551  HGB 11.5*  < > 9.9*  --  9.9*  --  9.8*  --   --   HCT 33.8*  < > 29.0*  --  28.1*  --  27.7*  --   --   PLT 277  --   --   --  174  --  210  --   --   APTT  --   --   --   --   --   --   --   --  120*  HEPARINUNFRC  --   --   --   --   --   --   --  0.88*  --   CREATININE 1.05  --   --   --   --   --  0.96  --   --   CKTOTAL  --   --   --  14  --  19 22  --   --   CKMB  --   --   --  2.1  --  3.1 3.5  --   --   TROPONINI  --   --   --  <0.30  --  0.38* 0.49*  --   --   < > = values in this interval not displayed.  Estimated Creatinine Clearance: 69.7 ml/min (by C-G formula based on Cr of 0.96).  Assessment: Patient is a 78 y.o M on xarelto PTA for hx DVT. S/p L Iliofemoral thromboembolectomy & endarterectomy 11/15  (lost 1500 mL of blood in surgery) requiring patch angioplasty of left iliofemoral arterial segment with subsequent hemorrhagic shock post procedure. Heparin level now back therapeutic at 0.39 after rate changed to 1100 units/hr this morning. No new bleeding documented.  Goal of Therapy:  Heparin level 0.3-0.7 units/ml Monitor platelets by anticoagulation protocol: Yes   Plan:  1) continue heparin drip at 1100 units/hr  Daylyn Azbill P 08/18/2013,1:53 PM

## 2013-08-18 NOTE — Progress Notes (Signed)
CRITICAL VALUE ALERT  Critical value received: Troponin 0.38  Date of notification: 08/23/2013  Time of notification: 2340  Critical value read back: yes  Nurse who received alert: Alfonso Ellis, RN  MD notified (1st page): Dr. Kendrick Fries  Time of first page: 2350  Responding MD: Dr. Kendrick Fries  Time MD responded: 2350

## 2013-08-18 NOTE — Progress Notes (Addendum)
[facility]  Daily Progress Note  Assessment/Planning: POD #1 s/p TE L fem-pop artery, EA with BPA L iliofem artery   NEURO: confused overnight, requiring soft restraint, suspect this might be partially his baseline  PULM: CPAP use overnight  CV: on Neo drip to support BP, Trop I elevated: suspect demand ischemia +/- new MI, will get Cardiology opinion  GI: PO as tolerated for now  FEN: cont on MIVF until   HEME/ID: no evidence of cont acute bleeding, transfuse 1 u pRBC to help heart and BP  VASC: L groin without acute hematoma, minimal out drain, will d/c tomorrow if output minimal   Subjective  - 1 Day Post-Op  Confusion overnight requiring restraints, no complaints  Objective Filed Vitals:   08/18/13 0645 08/18/13 0700 08/18/13 0715 08/18/13 0730  BP: 98/64 99/63 88/55  95/60  Pulse: 115 116 118 94  Temp:      TempSrc:      Resp: 22 22 22 22   Height:      Weight:      SpO2: 99% 99% 100% 99%    Intake/Output Summary (Last 24 hours) at 08/18/13 0801 Last data filed at 08/18/13 0600  Gross per 24 hour  Intake 9013.08 ml  Output   2695 ml  Net 6318.08 ml    PULM  BLL rales CV  RRR GI  soft, NTND VASC  Left groin bandaged, without obvious hematoma, drain with serosang fluid: 20 cc total since surgery, dopplerable L PT and DP  Laboratory CBC    Component Value Date/Time   WBC 19.3* 08/18/2013 0300   HGB 9.8* 08/18/2013 0300   HCT 27.7* 08/18/2013 0300   PLT 210 08/18/2013 0300    BMET    Component Value Date/Time   NA 133* 08/18/2013 0300   K 4.1 08/18/2013 0300   CL 101 08/18/2013 0300   CO2 20 08/18/2013 0300   GLUCOSE 109* 08/18/2013 0300   BUN 16 08/18/2013 0300   CREATININE 0.96 08/18/2013 0300   CREATININE 0.69 05/24/2013 1035   CALCIUM 8.1* 08/18/2013 0300   GFRNONAA 79* 08/18/2013 0300   GFRAA >90 08/18/2013 0300    Leonides Sake, MD [facility] Office: [phone] Pager:  [phone]  08/18/2013, 8:01 AM

## 2013-08-19 ENCOUNTER — Encounter (HOSPITAL_COMMUNITY): Payer: Self-pay | Admitting: Vascular Surgery

## 2013-08-19 DIAGNOSIS — T827XXA Infection and inflammatory reaction due to other cardiac and vascular devices, implants and grafts, initial encounter: Secondary | ICD-10-CM

## 2013-08-19 DIAGNOSIS — E118 Type 2 diabetes mellitus with unspecified complications: Secondary | ICD-10-CM | POA: Diagnosis present

## 2013-08-19 DIAGNOSIS — E785 Hyperlipidemia, unspecified: Secondary | ICD-10-CM | POA: Diagnosis present

## 2013-08-19 DIAGNOSIS — L02818 Cutaneous abscess of other sites: Secondary | ICD-10-CM

## 2013-08-19 DIAGNOSIS — T889XXS Complication of surgical and medical care, unspecified, sequela: Secondary | ICD-10-CM

## 2013-08-19 DIAGNOSIS — Z7901 Long term (current) use of anticoagulants: Secondary | ICD-10-CM

## 2013-08-19 DIAGNOSIS — I739 Peripheral vascular disease, unspecified: Secondary | ICD-10-CM

## 2013-08-19 DIAGNOSIS — I10 Essential (primary) hypertension: Secondary | ICD-10-CM | POA: Diagnosis present

## 2013-08-19 DIAGNOSIS — I4891 Unspecified atrial fibrillation: Secondary | ICD-10-CM

## 2013-08-19 DIAGNOSIS — I82409 Acute embolism and thrombosis of unspecified deep veins of unspecified lower extremity: Secondary | ICD-10-CM

## 2013-08-19 DIAGNOSIS — I330 Acute and subacute infective endocarditis: Secondary | ICD-10-CM

## 2013-08-19 LAB — HEPARIN LEVEL (UNFRACTIONATED): Heparin Unfractionated: 0.31 IU/mL (ref 0.30–0.70)

## 2013-08-19 LAB — GLUCOSE, CAPILLARY
Glucose-Capillary: 93 mg/dL (ref 70–99)
Glucose-Capillary: 95 mg/dL (ref 70–99)

## 2013-08-19 LAB — PROCALCITONIN: Procalcitonin: 0.3 ng/mL

## 2013-08-19 LAB — CBC
Hemoglobin: 8.7 g/dL — ABNORMAL LOW (ref 13.0–17.0)
MCH: 30 pg (ref 26.0–34.0)
MCHC: 35.2 g/dL (ref 30.0–36.0)
MCV: 85.2 fL (ref 78.0–100.0)
Platelets: 199 10*3/uL (ref 150–400)
RBC: 2.9 MIL/uL — ABNORMAL LOW (ref 4.22–5.81)

## 2013-08-19 LAB — COMPREHENSIVE METABOLIC PANEL
Albumin: 2 g/dL — ABNORMAL LOW (ref 3.5–5.2)
CO2: 19 mEq/L (ref 19–32)
Calcium: 7.7 mg/dL — ABNORMAL LOW (ref 8.4–10.5)
Creatinine, Ser: 0.85 mg/dL (ref 0.50–1.35)
GFR calc Af Amer: >90 mL/min (ref >90)
GFR calc non Af Amer: 83 mL/min — ABNORMAL LOW (ref >90)
Glucose, Bld: 98 mg/dL (ref 70–99)
Total Bilirubin: 0.7 mg/dL (ref 0.3–1.2)

## 2013-08-19 MED ORDER — GENTAMICIN SULFATE 40 MG/ML IJ SOLN
75.0000 mg | Freq: Three times a day (TID) | INTRAVENOUS | Status: DC
  Administered 2013-08-19 – 2013-08-20 (×5): 80 mg via INTRAVENOUS
  Filled 2013-08-19 (×6): qty 2

## 2013-08-19 MED ORDER — SODIUM CHLORIDE 0.9 % IV SOLN
300.0000 mg | Freq: Three times a day (TID) | INTRAVENOUS | Status: DC
  Administered 2013-08-19 – 2013-08-23 (×12): 300 mg via INTRAVENOUS
  Filled 2013-08-19 (×14): qty 300

## 2013-08-19 MED ORDER — POTASSIUM CHLORIDE 10 MEQ/50ML IV SOLN
10.0000 meq | INTRAVENOUS | Status: AC
  Administered 2013-08-19 (×2): 10 meq via INTRAVENOUS
  Filled 2013-08-19 (×2): qty 50

## 2013-08-19 MED ORDER — DEXTROSE 5 % IV SOLN
1.0000 g | Freq: Three times a day (TID) | INTRAVENOUS | Status: DC
  Administered 2013-08-19 – 2013-08-25 (×19): 1 g via INTRAVENOUS
  Filled 2013-08-19 (×23): qty 1

## 2013-08-19 MED ORDER — DEXTROSE 5 % IV SOLN
2.0000 g | INTRAVENOUS | Status: DC
  Administered 2013-08-19: 2 g via INTRAVENOUS
  Filled 2013-08-19: qty 2

## 2013-08-19 NOTE — Progress Notes (Signed)
PHARMACY NOTE  Pharmacy Consult for :  Gentamicin Indication:  Prosthetic valve endocarditis  Hospital Problems Principal Problem:   Critical lower limb ischemia Active Problems:   H/O aortic valve replacement with porcine valve; 23 mm Edwards Pericardial Magna Ease.-2000   OSA on CPAP   DVT (deep venous thrombosis)   Paroxysmal atrial flutter   Hemorrhagic shock   Acute blood loss anemia   CAD in native artery - s/p CABG in 2000   Prosthetic aortic valve vegetation   Type 2 diabetes mellitus with complications   Chronic anticoagulation   HTN (hypertension)   Dyslipidemia   Weight: IBW 75.3 kg  Vitals: BP 97/68  Pulse 104  Temp(Src) 98.7 F (37.1 C) (Oral)  Resp 22  Ht 5\' 11"  (1.803 m)  Wt 194 lb 10.7 oz (88.3 kg)  BMI 27.16 kg/m2  SpO2 98%  Labs:  Recent Labs  08/26/2013 2216 08/18/13 0300 08/19/13 0423  WBC  --  19.3* 14.0*  HGB  --  9.8* 8.7*  PLT  --  210 199  CREATININE 0.87 0.96 0.85   Estimated Creatinine Clearance: 78.7 ml/min (by C-G formula based on Cr of 0.85).   Microbiology: No results found for this or any previous visit (from the past 720 hour(s)).  Anti-infectives Anti-infectives   Start     Dose/Rate Route Frequency Ordered Stop   08/19/13 1145  cefTRIAXone (ROCEPHIN) 2 g in dextrose 5 % 50 mL IVPB     2 g 100 mL/hr over 30 Minutes Intravenous Every 24 hours 08/19/13 1115     08/19/13 0700  vancomycin (VANCOCIN) 1,250 mg in sodium chloride 0.9 % 250 mL IVPB     1,250 mg 166.7 mL/hr over 90 Minutes Intravenous Every 12 hours 08/18/13 1909     08/19/13 0300  piperacillin-tazobactam (ZOSYN) IVPB 3.375 g  Status:  Discontinued     3.375 g 12.5 mL/hr over 240 Minutes Intravenous Every 8 hours 08/18/13 1909 08/19/13 1115   08/18/13 1915  piperacillin-tazobactam (ZOSYN) IVPB 3.375 g     3.375 g 100 mL/hr over 30 Minutes Intravenous  Once 08/18/13 1909 08/18/13 2046   08/18/13 1915  vancomycin (VANCOCIN) 1,500 mg in sodium  chloride 0.9 % 500 mL IVPB     1,500 mg 250 mL/hr over 120 Minutes Intravenous  Once 08/18/13 1909 08/18/13 2217   08/21/2013 1900  cefUROXime (ZINACEF) 1.5 g in dextrose 5 % 50 mL IVPB     1.5 g 100 mL/hr over 30 Minutes Intravenous Every 12 hours 08/05/2013 1746 08/18/13 0702   08/30/2013 0857  ceFAZolin (ANCEF) 2-3 GM-% IVPB SOLR    Comments:  Brien Mates   : cabinet override      08/24/2013 0857 09/01/2013 2114      Assessment:  Patient is a 78 y.o M on xarelto PTA for hx DVT. S/p L Iliofemoral thromboembolectomy & endarterectomy 11/15 (lost 1500 mL of blood in surgery) requiring patch angioplasty of left iliofemoral arterial segment with subsequent hemorrhagic shock post procedure.  Subsequent TEE found vegetation on aortic valve.  Patient placed on Vancomycin and Zosyn for bioprosthetic valve endocarditis.  Today ID has changed antibiotic therapy to Vancomycin, Ceftriaxone, and Gentamicin per protocol.  Renal function stable with estimated CrCl ~ 80 ml/min.  WBC 14, currently Afebrile.   Goal of Therapy:   Gentamicin peak levels ~ 3 - 4 mcg/ml, troughs < 1. Antibiotics selected for infection/cultures and adjusted for renal function.   Plan:  1. Begin Gentamicin 1mg /kg/q 8 hours [  LBW].  Gentamicin 75 mg IV q 8 hours. 2. Will check Gentamicin levels at SS. 3. Follow up SCr, UOP, cultures, clinical course and adjust as clinically indicated.   Laurena Bering, Pharm.D.  08/19/2013 11:38 AM

## 2013-08-19 NOTE — Progress Notes (Signed)
ANTICOAGULATION CONSULT NOTE - Follow Up Consult  Pharmacy Consult for heparin Indication: hx DVT  Allergies  Allergen Reactions  . Bee Venom Anaphylaxis    Patient Measurements: Height: 5\' 11"  (180.3 cm) Weight: 194 lb 10.7 oz (88.3 kg) IBW/kg (Calculated) : 75.3 Heparin Dosing Weight: 88 kg  Vital Signs: Temp: 98.7 F (37.1 C) (11/17 0802) Temp src: Oral (11/17 0802) BP: 103/68 mmHg (11/17 0900) Pulse Rate: 99 (11/17 0900)  Labs:  Recent Labs  08/19/2013 0350  08/11/2013 1528 08/14/2013 2050 08/28/2013 2217 08/18/13 0300 08/18/13 0400 08/18/13 0551 08/18/13 1300 08/19/13 0423  HGB 11.5*  < >  --  9.9*  --  9.8*  --   --   --  8.7*  HCT 33.8*  < >  --  28.1*  --  27.7*  --   --   --  24.7*  PLT 277  --   --  174  --  210  --   --   --  199  APTT  --   --   --   --   --   --   --  120*  --   --   HEPARINUNFRC  --   --   --   --   --   --  0.88*  --  0.39 0.31  CREATININE 1.05  --   --   --   --  0.96  --   --   --  0.85  CKTOTAL  --   --  14  --  19 22  --   --   --   --   CKMB  --   --  2.1  --  3.1 3.5  --   --   --   --   TROPONINI  --   --  <0.30  --  0.38* 0.49*  --   --   --   --   < > = values in this interval not displayed.  Estimated Creatinine Clearance: 78.7 ml/min (by C-G formula based on Cr of 0.85).  Assessment: Patient is a 78 y.o M on xarelto PTA for hx DVT. S/p L Iliofemoral thromboembolectomy & endarterectomy 11/15  (lost 1500 mL of blood in surgery) requiring patch angioplasty of left iliofemoral arterial segment with subsequent hemorrhagic shock post procedure. Heparin level  therapeutic at 0.31 on 1100 units/hr. Hg= 8.7 (noted 9.8 on 11/16).  Goal of Therapy:  Heparin level 0.3-0.7 units/ml Monitor platelets by anticoagulation protocol: Yes   Plan:  1) continue heparin drip at 1100 units/hr 2)  Daily heparin level and CBC  Harland German, Pharm D 08/19/2013 9:20 AM

## 2013-08-19 NOTE — Progress Notes (Signed)
Subjective:  Awake and alert.  Objective:  Vital Signs in the last 24 hours: Temp:  [98.3 F (36.8 C)-99.3 F (37.4 C)] 98.7 F (37.1 C) (11/17 0802) Pulse Rate:  [62-127] 99 (11/17 0900) Resp:  [17-31] 22 (11/17 0900) BP: (79-120)/(48-85) 103/68 mmHg (11/17 0900) SpO2:  [94 %-99 %] 99 % (11/17 0900)  Intake/Output from previous day:  Intake/Output Summary (Last 24 hours) at 08/19/13 1021 Last data filed at 08/19/13 1017  Gross per 24 hour  Intake 3213.1 ml  Output   1605 ml  Net 1608.1 ml    Physical Exam: General appearance: alert, cooperative, no distress and moderately obese Lungs: decreased breath sounds Heart: irregularly irregular rhythm Extremities: Lt great toe black, Lt foot is warm   Rate: 98  Rhythm: It appears he is in AF with RBBB. He has been in NSR with 1st degree AVB in the past (last EKG July).   Lab Results:  Recent Labs  08/18/13 0300 08/19/13 0423  WBC 19.3* 14.0*  HGB 9.8* 8.7*  PLT 210 199    Recent Labs  08/18/13 0300 08/19/13 0423  NA 133* 136  K 4.1 3.5  CL 101 106  CO2 20 19  GLUCOSE 109* 98  BUN 16 10  CREATININE 0.96 0.85    Recent Labs  09/01/2013 2217 08/18/13 0300  TROPONINI 0.38* 0.49*   No results found for this basename: INR,  in the last 72 hours  Imaging: Imaging results have been reviewed  Cardiac Studies:  Assessment/Plan:   Principal Problem:   Critical lower limb ischemia Active Problems:   Hemorrhagic shock   Prosthetic aortic valve vegetation   H/O aortic valve replacement with porcine valve; 23 mm Edwards Pericardial Magna Ease.-2000   Paroxysmal atrial flutter   Acute blood loss anemia   CAD in native artery - s/p CABG in 2000   Type 2 diabetes mellitus with complications   Chronic anticoagulation   OSA on CPAP   DVT (deep venous thrombosis)   HTN (hypertension)   Dyslipidemia    PLAN: Unclear why he would have a thrombotic event on Xarelto. No INR was done on admission (this would  have been abnormal if he was taking Xarelto). Will discuss timing of TEE with MD, probably could not be done before Wed. Zosyn started. Heparin has been ordered. Will resume beta blocker at lower dose when he is off pressors.   Corine Shelter PA-C Beeper [phone] 08/19/2013, 10:21 AM   After discussion with Dr Herbie Baltimore, I have tentatively set the pt up for TEE Tuesday at noon with Dr Jens Som. This could be cancelled if the rounding cardiologist this afternoon feels we should wait.   Agree with note written by Corine Shelter PAC  On iv ATBX and pressors. BP soft. AF with CVR on IV hep. Hgb 8.7 . S/P Tx for hemorrhagic / hypovolemic shock. On exam he has an outflow murmur , clear lungs, I think we should wait another day prior to doing a TEE. I don't think the information will change his management over the next 24 hours.  Runell Gess 08/19/2013 5:31 PM

## 2013-08-19 NOTE — Progress Notes (Addendum)
VASCULAR SURGERY BYPASS PROGRESS NOTE   2 Days Post-Op Left iliofemoropopliteal thromboembolectomy  Left iliofemoral endarterectomy  Patch angioplasty of left iliofemoral arterial segment   SUBJECTIVE: pt resting comfortably. Denies pain in feet or left great toe. Some confusion overnight  PHYSICAL EXAM: BP Readings from Last 3 Encounters:  08/19/13 120/68  08/19/13 120/68  04/15/13 135/62   Temp Readings from Last 3 Encounters:  08/19/13 99.3 F (37.4 C) Oral  08/19/13 99.3 F (37.4 C) Oral  04/15/13 98.4 F (36.9 C) Oral   Pulse Readings from Last 3 Encounters:  08/19/13 85  08/19/13 85  04/15/13 71   SpO2 Readings from Last 3 Encounters:  08/19/13 98%  08/19/13 98%  04/15/13 99%     Intake/Output Summary (Last 24 hours) at 08/19/13 0744 Last data filed at 08/19/13 0700  Gross per 24 hour  Intake 2957.7 ml  Output   1420 ml  Net 1537.7 ml    Extremities: Incisions clean, dry and intact Left great toe demarcating Pulse status biphasic DP on right; PT on left Good sensation and motion both LE  LABS: Lab Results  Component Value Date   WBC 14.0* 08/19/2013   HGB 8.7* 08/19/2013   HCT 24.7* 08/19/2013   MCV 85.2 08/19/2013   PLT 199 08/19/2013   Lab Results  Component Value Date   CREATININE 0.85 08/19/2013   Lab Results  Component Value Date   INR 0.98 04/02/2013       ASSESSMENT: 2 Days Post-Op   PLAN: ST now NSR rate 80's OOB, PT/OT when off neo Hypotension -weaning off neo Post-op acute blood loss anemia - H/H down after 1 UPC yest - may be dilutional as pt is 6 L + on I/O  GU: cont foley - pt needs strict I/O, may need diuresis today if BP holds  Wound Management: dry dressing to left groin  DVT prophylaxis: IV Heparin  ROCZNIAK,REGINA J 7:50 AM 08/19/2013  Addendum  I have independently interviewed and examined the patient, and I agree with the physician assistant's findings.  Pt confused and combative.  L femoral drain  with minimal drainage and no hematoma evidence in left groin, suggesting drop in H/H likely hemodilutional.  Left foot somewhat edematous and warm.  ABI demonstrates successful recannulation of AT despite likely embolization to that artery.  PT is triphasic.  R great toe is gangrenous without any signs of infection.  L great toe amputation may be necessary in the future but not immediately.  Cont titrating off Neo drip.  Per Cardiology, TEE will be needed once more stable.  Once those findings are available, CT surgical consultation might be needed.  Active problems: 1. Shock, possible some element of septic shock vs. Hemorrhagic shock: continue weaning Neo drip and abx, BC x 2 pending 2. Possible infectious endocarditis: vegetation found on TTE, TEE once pt stable, cont Vanco and Zosyn, will need ID c/s at some point 3.  Acute embolism: possible cardiac source, continue anticoagulation, ok to switch to Lovenox 4.  Acute blood loss from surgery: no active bleeding 5.  Hemodilution from fluid overload: not candidate for diuresis yet 6.  Prior DVT: treated with Lovenox 7.  Altered mental status: pt baseline has some elements of dementia, so I suspect some degree of ICU psychosis superimposed on baseline altered mental status, restraints +/- Haldol may be needed 8.  Elevated troponin I: already on anticoagulation, no indication for cardiac intervention at this time  Leonides Sake, MD [facility] [place] Office: [phone] Pager: [phone]  08/19/2013, 9:22 AM

## 2013-08-19 NOTE — Progress Notes (Signed)
VASCULAR LAB PRELIMINARY  ARTERIAL  ABI completed:    RIGHT    LEFT    PRESSURE WAVEFORM  PRESSURE WAVEFORM  BRACHIAL 117 Triphasic BRACHIAL 114 Triphasic  DP >316 Triohasic DP >332 Biphasic  PT 136 Triphsic PT 137 Triphasic    RIGHT LEFT  ABI N/A N/A   ABIs not ascertained due to incompressible vessels in the Dorsalis Pedis. Bilateral posterior tibial ABIs right 0.86 left 0.86 Doppler waveforms are within normal limits bilaterally at rest  [name] [name], RVS 08/19/2013, 8:29 AM

## 2013-08-19 NOTE — Evaluation (Signed)
Clinical/Bedside Swallow Evaluation Patient Details  Name: TOBYN OSGOOD MRN: [id number] Date of Birth: 1935/07/13  Today's Date: 08/19/2013 Time: 1025-1040 SLP Time Calculation (min): 15 min  Past Medical History:  Past Medical History  Diagnosis Date  . Coronary artery disease 2000    s/p CABG x 4  . Hypertension   . Hyperlipidemia   . S/P CABG x 4   . Diabetes mellitus without complication 2000  . GERD (gastroesophageal reflux disease)   . History of kidney stones   . History of gout   . Sleep apnea     USES C-PAP  . Cyst of eye     X3 CYSTS in left eyeHX OF LOSS OF VISION - RESOLVED AT PRESENT  . Pneumonia     hx of  . Arthritis   . Still's disease 1997    in remission  . Complication of anesthesia     can not pee after surgery  . H/O Aortic stenosis, severe 2009    s/p AVR; Echo 12/2012 - well seated valve.  EF ~50-55%, mild Ant-septal HK.  Marland Kitchen History of embolic stroke without residual deficits     Noted on MRI 05/2013  . History of DVT (deep vein thrombosis)   . Paroxysmal atrial flutter 04/10/2013    was on Xarelto   Past Surgical History:  Past Surgical History  Procedure Laterality Date  . Joint replacement  1996/2005    bil  . Lung mass  1965    benign mass removed  . Colon resection      benign mass  . Hiatal hernia repair  2000  . Aortic valve replacement  2009  . Back surgery  2011  . Coronary artery bypass graft  2000    4 VESSELLS  . Lithotripsy    . Tonsillectomy  at 78 years old  . Hammer toe surgery Right     x2  . Bunionectomy Left   . Knee arthroscopy Right   . Total knee revision Right 04/10/2013    Procedure: RIGHT TOTAL KNEE REVISION;  Surgeon: Loanne Drilling, MD;  Location: [facility] ORS;  Service: Orthopedics;  Laterality: Right;  . Cardioversion N/A 04/12/2013    Procedure: CARDIOVERSION;  Surgeon: Chrystie Nose, MD;  Location[facility];  Service: Cardiovascular;  Laterality: N/A;  . Nm myoview ltd  04/2012    Lexiscan: No evidence  of Ischemia   HPI:  78 y.o. male who presents initially to [facility] with L great toe discoloration. After pulses were not detectable in that foot and foot was noted to be obviously mottled with dusky appearance of toe patient was transferred to [facility] for critical lower limb ischemia and vascular surgery eval. Further history taking from the family reveals that the patients symptoms onset after a toe injury 2 weeks ago, and have been steadily worsening in that time frame. Because of the worsening in color of his toe, he decided to go to the hospital earlier this evening. Pt. underwent Left iliofemoropopliteal thromboembolectomy, Left iliofemoral endarterectomy, Patch angioplasty of left iliofemoral arterial segment.  No acute cardiopulmonary disease. No change from the previous day's. Patient has a known history of "bad circulation". He is on Xarelto chronically.  PMH:  CAD, HTN, DM, GERD, pna,Still's disease.   Assessment / Plan / Recommendation Clinical Impression  Pt. required mild verbal cues throughout session due to mild confusion.  Immediate cough x 1 following large bite cracker with straw sip thin to assist in oral transit.  No  s/s aspiration with small sips via straw.  Aspiration risk appears low due to no acute abnormalities on CXR, intubation period brief and no documented neuro deficits (pt. did report having a stroke "last week").  Recommend regular texture diet, thin liquids and pills with water (if large recommend pill with applesauce).  No ST follow up needed.    Aspiration Risk  Mild    Diet Recommendation Regular;Thin liquid   Liquid Administration via: Cup;Straw Medication Administration: Whole meds with liquid (in applesauce if pill large ) Supervision: Patient able to self feed;Intermittent supervision to cue for compensatory strategies Compensations: Slow rate;Small sips/bites Postural Changes and/or Swallow Maneuvers: Seated upright 90 degrees    Other  Recommendations Oral  Care Recommendations: Oral care BID   Follow Up Recommendations  None    Frequency and Duration        Pertinent Vitals/Pain WDl         Swallow Study         Oral/Motor/Sensory Function Overall Oral Motor/Sensory Function: Appears within functional limits for tasks assessed   Ice Chips Ice chips: Not tested   Thin Liquid Thin Liquid: Impaired Presentation: Straw;Cup Pharyngeal  Phase Impairments: Cough - Immediate (with cracker in oral cavity simultaneously)    Nectar Thick Nectar Thick Liquid: Not tested   Honey Thick Honey Thick Liquid: Not tested   Puree Puree: Not tested   Solid   GO    Solid: Within functional limits       Breck Coons [employer] [phone]  08/19/2013

## 2013-08-19 NOTE — Progress Notes (Signed)
UR Completed.  [name] [name] Jane [phone] 08/19/2013  

## 2013-08-19 NOTE — Progress Notes (Signed)
[facility] ADULT ICU REPLACEMENT PROTOCOL FOR AM LAB REPLACEMENT ONLY  The patient does apply for the [facility] Adult ICU Electrolyte Replacment Protocol based on the criteria listed below:   1. Is GFR >/= 40 ml/min? yes  Patient's GFR today is 83 2. Is urine output >/= 0.5 ml/kg/hr for the last 6 hours? yes Patient's UOP is 0.19ml/kg/hr 3. Is BUN < 60 mg/dL? yes  Patient's BUN today is 10 4. Abnormal electrolyte(s)Potassium 5. Ordered repletion with:Potassium per protocol  [name] [name] P 08/19/2013 6:03 AM

## 2013-08-19 NOTE — Evaluation (Signed)
Physical Therapy Evaluation Patient Details Name: [name] [name] MRN: [id number] DOB: 1935-02-24 Today's Date: 08/19/2013 Time: 4098-1191 PT Time Calculation (min): 35 min  PT Assessment / Plan / Recommendation History of Present Illness  78 yo with CAD, DM-2, AF on xarelto with hemorrhagic shock after emergent Left iliofemoropopliteal thromboembolectomy requiring Patch angioplasty of left iliofemoral arterial segment for acute left leg ischemia. Pt with post-op NSTEMI.  Clinical Impression  Pt admitted with above. Pt currently with functional limitations due to the deficits listed below (see PT Problem List).  Pt will benefit from skilled PT to increase their independence and safety with mobility to allow discharge to the venue listed below. Feel pt's recovery will be slow and will need ST-SNF prior to return home.     PT Assessment  Patient needs continued PT services    Follow Up Recommendations  SNF    Does the patient have the potential to tolerate intense rehabilitation      Barriers to Discharge        Equipment Recommendations  None recommended by PT    Recommendations for Other Services     Frequency Min 3X/week    Precautions / Restrictions Precautions Precautions: Fall   Pertinent Vitals/Pain See flow sheet.  No signs of pain.      Mobility  Bed Mobility Bed Mobility: Supine to Sit;Sitting - Scoot to Edge of Bed;Sit to Supine Supine to Sit: 3: Mod assist;HOB elevated Sitting - Scoot to Edge of Bed: 3: Mod assist Sit to Supine: 4: Min assist Details for Bed Mobility Assistance: Assist to bring trunk up and hips to EOB. Transfers Transfers: Sit to Stand;Stand to Sit Sit to Stand: 2: Max assist;With upper extremity assist;From bed Stand to Sit: 3: Mod assist;With upper extremity assist;To bed Details for Transfer Assistance: Unable to achieve full upright standing with 1 person assist.  Pt standing with knees, trunk, and hips flexed.    Exercises      PT Diagnosis: Difficulty walking;Generalized weakness  PT Problem List: Decreased strength;Decreased activity tolerance;Decreased balance;Decreased mobility;Decreased knowledge of use of DME;Decreased knowledge of precautions;Decreased cognition PT Treatment Interventions: DME instruction;Gait training;Functional mobility training;Therapeutic activities;Therapeutic exercise;Balance training;Patient/family education;Cognitive remediation     PT Goals(Current goals can be found in the care plan section) Acute Rehab PT Goals Patient Stated Goal: Pt wants to return home. PT Goal Formulation: With patient Time For Goal Achievement: 08/26/13 Potential to Achieve Goals: Good  Visit Information  Last PT Received On: 08/19/13 Assistance Needed: +2 History of Present Illness: 78 yo with CAD, DM-2, AF on xarelto with hemorrhagic shock after emergent Left iliofemoropopliteal thromboembolectomy requiring Patch angioplasty of left iliofemoral arterial segment for acute left leg ischemia. Pt with post-op NSTEMI.       Prior Functioning  Home Living Family/patient expects to be discharged to:: Private residence Living Arrangements: Spouse/significant other;Children Available Help at Discharge: Family Type of Home: House Home Access: Stairs to enter Secretary/administrator of Steps: 3 Home Layout: One level Home Equipment: Environmental consultant - 2 wheels;Toilet riser;Bedside commode;Wheelchair - manual Prior Function Level of Independence: Independent Comments: Pt reports he was not using the walker anymore. Communication Communication: HOH    Cognition  Cognition Arousal/Alertness: Awake/alert Behavior During Therapy: WFL for tasks assessed/performed Overall Cognitive Status: No family/caregiver present to determine baseline cognitive functioning Memory: Decreased short-term memory;Decreased recall of precautions    Extremity/Trunk Assessment Upper Extremity Assessment Upper Extremity Assessment:  Defer to OT evaluation Lower Extremity Assessment Lower Extremity Assessment: Generalized weakness   Balance  Balance Balance Assessed: Yes Static Sitting Balance Static Sitting - Balance Support: Bilateral upper extremity supported Static Sitting - Level of Assistance: 5: Stand by assistance Static Sitting - Comment/# of Minutes: Sat EOB x 15 minutes.  End of Session PT - End of Session Equipment Utilized During Treatment: Gait belt Activity Tolerance: Patient limited by fatigue Patient left: in bed;with call bell/phone within reach;with bed alarm set Nurse Communication: Mobility status  GP     [facility] 08/19/2013, 11:27 AM  Skip Mayer PT [phone]

## 2013-08-19 NOTE — Progress Notes (Signed)
PULMONARY  / CRITICAL CARE MEDICINE  Name: [name] [name] MRN: [id number] DOB: 1935-08-06    ADMISSION DATE:  28-Aug-2013 CONSULTATION DATE:  08/07/2013   REFERRING MD :  DR. Imogene Burn  PCP - Dhruv Vyas  PRIMARY SERVICE:   CHIEF COMPLAINT:  Hypotension/shock  post op   BRIEF PATIENT DESCRIPTION: 78 yo with CAD, DM-2, AF on xarelto with hemorrhagic shock after emergent Left iliofemoropopliteal thromboembolectomy requiring Patch angioplasty of left iliofemoral arterial segment for acute left leg ischemia . Op EBL ~1500cc, transfused 4 u PRBC and given albumin x 2  Intraoperatively. Started on pressors in OR .  PCCM asked to consult for hypotension [name] 11/15    SIGNIFICANT EVENTS / STUDIES:  11/14 CT angio bifem >embolism left common femoral , left popliteal arteries w/ extension to left peroneal artery as well as right deep femoral artery and post tibial arteries.  11/15 OR for emergent left iliofemoropopliteal thromboembolectomy /patch angioplasty 11/15 NSTEMI post op 11/16 TTE> aortic veg on bioprosthetic valve, mild LVH, normal systolic function; LA severly dilated, RA mild dilated, RVSP  LINES / TUBES: 11/15 R. IJ >>  CULTURES:   ANTIBIOTICS: 11/15 Zinacef post op  11/17 vanc >> 11/17 zosyn >>  SUBJECTIVE:  Confused Vanc/zosyn started Pressors weaning   VITAL SIGNS: Temp:  [98.3 F (36.8 C)-99.3 F (37.4 C)] 98.7 F (37.1 C) (11/17 0802) Pulse Rate:  [62-127] 91 (11/17 0800) Resp:  [17-31] 20 (11/17 0800) BP: (79-120)/(48-85) 112/69 mmHg (11/17 0800) SpO2:  [94 %-99 %] 99 % (11/17 0800) HEMODYNAMICS: CVP:  [5 mmHg-7 mmHg] 7 mmHg VENTILATOR SETTINGS:   INTAKE / OUTPUT: Intake/Output     11/16 0701 - 11/17 0700 11/17 0701 - 11/18 0700   I.V. (mL/kg) 2272.3 (25.7) 97.3 (1.1)   Blood     Other 30    IV Piggyback 850    Total Intake(mL/kg) 3152.3 (35.7) 97.3 (1.1)   Urine (mL/kg/hr) 1445 (0.7)    Drains 35 (0)    Blood     Total Output 1480     Net  +1672.3 +97.3          PHYSICAL EXAMINATION:  Gen: confused HEENT: NCAT, OP clear PULM: CTA B CV: TAchy, irreg irreg AB: BS+, nontender Ext: L toe purple, L groin wound site c/d/i Neuro: confused but easily redirected  LABS:  CBC  Recent Labs Lab 08/08/2013 2050 08/18/13 0300 08/19/13 0423  WBC 18.6* 19.3* 14.0*  HGB 9.9* 9.8* 8.7*  HCT 28.1* 27.7* 24.7*  PLT 174 210 199   Coag's  Recent Labs Lab 08/18/13 0551  APTT 120*   BMET  Recent Labs Lab 08/18/2013 0350  08/24/2013 1504 08/18/13 0300 08/19/13 0423  NA 131*  < > 135 133* 136  K 4.5  < > 4.7 4.1 3.5  CL 97  --   --  101 106  CO2 22  --   --  20 19  BUN 24*  --   --  16 10  CREATININE 1.05  --   --  0.96 0.85  GLUCOSE 104*  < > 190* 109* 98  < > = values in this interval not displayed. Electrolytes  Recent Labs Lab 08/06/2013 0350 08/18/13 0300 08/19/13 0423  CALCIUM 8.9 8.1* 7.7*   Sepsis Markers  Recent Labs Lab 08/14/2013 1747 08/05/2013 1830 08/18/13 0300 08/19/13 0423  LATICACIDVEN  --  1.8  --   --   PROCALCITON 0.17  --  0.31 0.30   ABG No results  found for this basename: PHART, PCO2ART, PO2ART,  in the last 168 hours Liver Enzymes  Recent Labs Lab 08/18/13 0300 08/19/13 0423  AST 11 23  ALT <5 <5  ALKPHOS 48 50  BILITOT 0.9 0.7  ALBUMIN 2.4* 2.0*   Cardiac Enzymes  Recent Labs Lab 08/15/2013 1528 08/21/2013 2217 08/18/13 0300  TROPONINI <0.30 0.38* 0.49*   Glucose  Recent Labs Lab 08/18/13 1230 08/18/13 1641 08/18/13 1940 08/19/13 0007 08/19/13 0411 08/19/13 0759  GLUCAP 110* 113* 101* 95 93 105*    Imaging  11/16 CXR: RIJ in good position, No acute cardiopulmonary disease   ASSESSMENT / PLAN:  PULMONARY A:  Hx of OSA  P:   Titrate O2 for sat >90% Nocturnal CPAP   CARDIOVASCULAR A:  Hypotension /Hypovolemic Shock -presumably volume related?  Aortic valve bioprosthesis vegetation > infectious? Sterile? Cultures negative;   CAD /Atrial fib on Xarelto  prior to admission  PVD w/ critical left limb ischemia s/p embolectomy/angioplasty presumably related to Aortic valve veg NSTEMI vs demand w/ postive enzymes  11/16 : lactate 1.8, PCT 0.17, cortisol 20 P:  Wean neo off today Cont Hep Drip  TEE? Agree with empiric ABX, and should Consider ID consult, but at this point no clear infection Vascular following  Cards consult    RENAL A:  Renal Insuffiency  11/16 Decreased UOP , scr  P:   Monitor  Check foley /flush   GASTROINTESTINAL A:  Protein calorie malnutrition P:   Monitor  PPI  Speech eval today> if fails then will need Dobhoff/tube feeds  HEMATOLOGIC A:  Anemia -post op blood loss  S/p 4 u PRBC and 2 albumin during surgery 11/15  Clot and aortic vegetation on Xarelto P:  Monitor cbc closely , transfuse for Hb 8 or lower given CAD No more Xarelto Heparin gtt for now  INFECTIOUS 11/15 PCT low  Aortic valve vegetation A:   P:   Monitor fever curve Continue empiric vanc/zosyn for now F/u TEE results    ENDOCRINE A:  DM  P:   ICU SSI   NEUROLOGIC A:  Dementia  Delirium P:   Monitor  Restart po meds when b/p improves  Minimize sedating meds  Global: PT/OT consult today Family: wife updated by phone by me   TODAY'S SUMMARY: Hemorrhagic shock post femoral artery surgery hopefully due to hypovolemia alone; not clearly septic but now appears to have a vege on his aortic valve I have personally obtained a history, examined the patient, evaluated laboratory and imaging results, formulated the assessment and plan and placed orders. CRITICAL CARE: The patient is critically ill with multiple organ systems failure and requires high complexity decision making for assessment and support, frequent evaluation and titration of therapies, application of advanced monitoring technologies and extensive interpretation of multiple databases. Critical Care Time devoted to patient care services described in this note is 40  minutes.   Fonnie Jarvis Pulmonary and Critical Care Medicine [facility] Pager: [phone]  08/19/2013, 8:57 AM

## 2013-08-19 NOTE — Evaluation (Signed)
Occupational Therapy Evaluation Patient Details Name: [name] [name] MRN: [id number] DOB: 05-Sep-1935 Today's Date: 08/19/2013 Time: 1191-4782 OT Time Calculation (min): 19 min  OT Assessment / Plan / Recommendation History of present illness 78 yo with CAD, DM-2, AF on xarelto with hemorrhagic shock after emergent Left iliofemoropopliteal thromboembolectomy requiring Patch angioplasty of left iliofemoral arterial segment for acute left leg ischemia. Pt with post-op NSTEMI.   Clinical Impression   Pt admitted with above.  He presents to OT with the below listed deficits and will benefit from continued OT to maximize safety and independence with BADLs.  Currently, he requires mod A for basic transfers and max A for BADLs.  Anticipate he will require SNF level rehab at discharge.     OT Assessment  Patient needs continued OT Services    Follow Up Recommendations  SNF;Supervision/Assistance - 24 hour    Barriers to Discharge Decreased caregiver support    Equipment Recommendations  None recommended by OT    Recommendations for Other Services    Frequency  Min 2X/week    Precautions / Restrictions Precautions Precautions: Fall   Pertinent Vitals/Pain     ADL  Eating/Feeding: Set up;Supervision/safety Where Assessed - Eating/Feeding: Chair;Bed level Grooming: Wash/dry hands;Wash/dry face;Set up;Supervision/safety Where Assessed - Grooming: Supported sitting Upper Body Bathing: Moderate assistance Where Assessed - Upper Body Bathing: Supported sitting Lower Body Bathing: +1 Total assistance Where Assessed - Lower Body Bathing: Supported sit to stand Upper Body Dressing: +1 Total assistance Where Assessed - Upper Body Dressing: Unsupported sitting;Supported sitting Lower Body Dressing: +1 Total assistance Where Assessed - Lower Body Dressing: Supported sit to Pharmacist, hospital: Moderate assistance Toilet Transfer Method: Sit to stand;Stand pivot Toileting - Clothing  Manipulation and Hygiene: +1 Total assistance Where Assessed - Toileting Clothing Manipulation and Hygiene: Standing Transfers/Ambulation Related to ADLs: Pt requires mod A.  stand pivot    OT Diagnosis: Generalized weakness;Cognitive deficits;Acute pain  OT Problem List: Decreased strength;Decreased activity tolerance;Impaired balance (sitting and/or standing);Decreased cognition;Decreased safety awareness;Decreased knowledge of use of DME or AE;Pain OT Treatment Interventions: Self-care/ADL training;DME and/or AE instruction;Therapeutic activities;Cognitive remediation/compensation;Patient/family education;Balance training   OT Goals(Current goals can be found in the care plan section) Acute Rehab OT Goals Patient Stated Goal: Pt did not state OT Goal Formulation: With patient Time For Goal Achievement: 09/02/13 Potential to Achieve Goals: Good  Visit Information  Last OT Received On: 08/19/13 Assistance Needed: +2 History of Present Illness: 78 yo with CAD, DM-2, AF on xarelto with hemorrhagic shock after emergent Left iliofemoropopliteal thromboembolectomy requiring Patch angioplasty of left iliofemoral arterial segment for acute left leg ischemia. Pt with post-op NSTEMI.       Prior Functioning     Home Living Family/patient expects to be discharged to:: Private residence Living Arrangements: Spouse/significant other;Children Available Help at Discharge: Family Type of Home: House Home Access: Stairs to enter Secretary/administrator of Steps: 3 Home Layout: One level Home Equipment: Environmental consultant - 2 wheels;Toilet riser;Bedside commode;Wheelchair - manual Additional Comments: Pt reports he was at SNF level rehab recently for rehab post CVA Prior Function Level of Independence: Needs assistance Gait / Transfers Assistance Needed: Pt states that sons walk with him, and he wasn't "back on my feet yet" ADL's / Homemaking Assistance Needed: Pt with difficulty explaining amount of assist  required Comments: Pt reports he was not using the walker anymore. Communication Communication: HOH Dominant Hand: Right         Vision/Perception Vision - History Visual History: Other (comment) (  h/o diplopia due to previous stroke) Patient Visual Report: No change from baseline Vision - Assessment Vision Assessment: Vision not tested Additional Comments: Pt denies diplopia during eval    Cognition  Cognition Arousal/Alertness: Lethargic Behavior During Therapy: Flat affect General Comments: Pt with difficulty following conversation.  Answers to questions inaccurate at times.  mod verbal cues for problem solving     Extremity/Trunk Assessment Upper Extremity Assessment Upper Extremity Assessment: Generalized weakness Lower Extremity Assessment Lower Extremity Assessment: Defer to PT evaluation     Mobility Bed Mobility Bed Mobility: Rolling Left;Left Sidelying to Sit;Sitting - Scoot to Edge of Bed Rolling Left: 3: Mod assist Left Sidelying to Sit: 3: Mod assist;With rails;HOB elevated Sitting - Scoot to Edge of Bed: 3: Mod assist Details for Bed Mobility Assistance: step by step cues.  Assist to move LEs off of bed and assist to lift trunk  Transfers Transfers: Sit to Stand;Stand to Sit Sit to Stand: 3: Mod assist;With upper extremity assist;From bed Stand to Sit: 3: Mod assist;With upper extremity assist;To bed;To chair/3-in-1 Details for Transfer Assistance: Pt requires max verbal cues for hand placement and safe technique.   Faciltation for hip extension.  Pt tends to grab onto bedrails and not let go despite cues to reach for chair     Exercise     Balance Balance Balance Assessed: Yes Static Sitting Balance Static Sitting - Balance Support: Bilateral upper extremity supported;Feet supported Static Sitting - Level of Assistance: 4: Min assist Static Sitting - Comment/# of Minutes: Pt leaning to Left Static Standing Balance Static Standing - Balance Support:  Bilateral upper extremity supported Static Standing - Level of Assistance: 3: Mod assist Static Standing - Comment/# of Minutes: 30 seconds   End of Session OT - End of Session Activity Tolerance: Patient tolerated treatment well Patient left: in chair;with call bell/phone within reach;with nursing/sitter in room Nurse Communication: Mobility status  GO     Jeani Hawking M 08/19/2013, 3:24 PM

## 2013-08-19 NOTE — Progress Notes (Signed)
[name] [name], [place] updated on pt left little toe. ID took dsg off-skin tear present with moisture present between all toes. Suggestions to apply xeroform to wound and wrap with kerlix. Will continue to monitor closely. Kerin Ransom, RN

## 2013-08-19 NOTE — Consult Note (Addendum)
[facility]    Date of Admission:  08/08/2013  Date of Consult:  08/19/2013  Reason for Consult:  prostheticAortic valve endocarditis with perivalvular abscess Referring Physician:Dr. Imogene Burn   HPI: [name] [name] is an 78 y.o. male with past medical history significant for coronary artery bypass grafting, aortic valve replacement for severe aortic stenosis, paroxysmal atrial fibrillation, peripheral vascular disease who was transferred from [facility] to [facility]  With concerns for critical limb ischemia. Apparently approximately 2-3 weeks ago patient had bumped his toe and had worsening discoloration of his large toe since then. He also has an area in his small fifth toe worries had ulceration and drainage.  He was seen by Dr. Imogene Burn and CT A was performed. This showed multiple embolic phenomena involving left common femoral and left below-knee popliteal arteries with extension to involve primarily the left peroneal artery as well as the right deep femoral artery and right posterior tibial arteries. \  He underwent emergent surgery by Dr. Imogene Burn with "  1. Left iliofemoropopliteal thromboembolectomy 2. Left iliofemoral endarterectomy 3. Patch angioplasty of left iliofemoral arterial segment 4. Open cannulation of common femoral artery  5. Left leg runoff    Postoperative course was complicated by hemorrhagic shock. Pt was placed no Neosynephrine, Dopamine, He was started on vancomycin and Zosyn yesterday the 16th.   Transthoracic echocardiogram done to look for source of emboli  Shows:  " large echo free space separating the prosthetic aortic  valve from the aortic annulus, concerning for perivalvular abscess. Calcified annulus. Mildly thickened leaflets. A bioprosthesis was present. Moderate focal thickening and calcification.  There was an apparent,medium-sized, mobile vegetation on the aortic aspect of the left coronary cusp; Transvalvular velocity was  increased more than expected. 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   Past Medical History  Diagnosis Date  . Coronary artery disease 2000    s/p CABG x 4  . Hypertension   . Hyperlipidemia   . S/P CABG x 4   . Diabetes mellitus without complication 2000  . GERD (gastroesophageal reflux disease)   . History of kidney stones   . History of gout   . Sleep apnea     USES C-PAP  . Cyst of eye     X3 CYSTS in left eyeHX OF LOSS OF VISION - RESOLVED AT PRESENT  . Pneumonia     hx of  . Arthritis   . Still's disease 1997    in remission  . Complication of anesthesia     can not pee after surgery  . H/O Aortic stenosis, severe 2009    s/p AVR; Echo 12/2012 - well seated valve.  EF ~50-55%, mild Ant-septal HK.  Marland Kitchen History of embolic stroke without residual deficits     Noted on MRI 05/2013  . History of DVT (deep vein thrombosis)   . Paroxysmal atrial flutter 04/10/2013    was on Xarelto    Past Surgical History  Procedure Laterality Date  . Joint replacement  1996/2005    bil  . Lung mass  1965    benign mass removed  . Colon resection      benign mass  . Hiatal hernia repair  2000  . Aortic valve replacement  2009  . Back surgery  2011  . Coronary artery bypass graft  2000  4 VESSELLS  . Lithotripsy    . Tonsillectomy  at 78 years old  . Hammer toe surgery Right     x2  . Bunionectomy Left   . Knee arthroscopy Right   . Total knee revision Right 04/10/2013    Procedure: RIGHT TOTAL KNEE REVISION;  Surgeon: Loanne Drilling, MD;  Location: [facility] ORS;  Service: Orthopedics;  Laterality: Right;  . Cardioversion N/A 04/12/2013    Procedure: CARDIOVERSION;  Surgeon: Chrystie Nose, MD;  Location: [facility] ENDOSCOPY;  Service: Cardiovascular;  Laterality: N/A;  . Nm myoview ltd  04/2012    Lexiscan: No evidence of Ischemia  . Thrombectomy femoral  artery Left 08/12/2013    Procedure: THROMBECTOMY FEMORAL ARTERY;  Surgeon: Fransisco Hertz, MD;  Location: [facility] OR;  Service: Vascular;  Laterality: Left;  . Endarterectomy femoral Left 08/18/2013    Procedure: ENDARTERECTOMY FEMORAL with patch angioplasty using a 1cm by 6cm Vascuguard bovine patch;  Surgeon: Fransisco Hertz, MD;  Location: [facility] OR;  Service: Vascular;  Laterality: Left;  . Intraoperative arteriogram Left 08/29/2013    Procedure: INTRA OPERATIVE ARTERIOGRAM;  Surgeon: Fransisco Hertz, MD;  Location: [facility] OR;  Service: Vascular;  Laterality: Left;  ergies:   Allergies  Allergen Reactions  . Bee Venom Anaphylaxis     Medications: I have reviewed patients current medications as documented in Epic Anti-infectives   Start     Dose/Rate Route Frequency Ordered Stop   08/19/13 1300  gentamicin (GARAMYCIN) 80 mg in dextrose 5 % 50 mL IVPB     75 mg 104 mL/hr over 30 Minutes Intravenous Every 8 hours 08/19/13 1154     08/19/13 1145  cefTRIAXone (ROCEPHIN) 2 g in dextrose 5 % 50 mL IVPB     2 g 100 mL/hr over 30 Minutes Intravenous Every 24 hours 08/19/13 1115     08/19/13 0700  vancomycin (VANCOCIN) 1,250 mg in sodium chloride 0.9 % 250 mL IVPB     1,250 mg 166.7 mL/hr over 90 Minutes Intravenous Every 12 hours 08/18/13 1909     08/19/13 0300  piperacillin-tazobactam (ZOSYN) IVPB 3.375 g  Status:  Discontinued     3.375 g 12.5 mL/hr over 240 Minutes Intravenous Every 8 hours 08/18/13 1909 08/19/13 1115   08/18/13 1915  piperacillin-tazobactam (ZOSYN) IVPB 3.375 g     3.375 g 100 mL/hr over 30 Minutes Intravenous  Once 08/18/13 1909 08/18/13 2046   08/18/13 1915  vancomycin (VANCOCIN) 1,500 mg in sodium chloride 0.9 % 500 mL IVPB     1,500 mg 250 mL/hr over 120 Minutes Intravenous  Once 08/18/13 1909 08/18/13 2217   08/31/2013 1900  cefUROXime (ZINACEF) 1.5 g in dextrose 5 % 50 mL IVPB     1.5 g 100 mL/hr over 30 Minutes Intravenous Every 12 hours 08/15/2013 1746 08/18/13 0702   08/13/2013  0857  ceFAZolin (ANCEF) 2-3 GM-% IVPB SOLR    Comments:  Brien Mates   : cabinet override      08/11/2013 0857 08/05/2013 2114      Social History:  reports that he quit smoking about 51 years ago. His smoking use included Cigarettes. He smoked 0.00 packs per day for 5 years. He has never used smokeless tobacco. He reports that he drinks alcohol. He reports that he does not use illicit drugs.  History reviewed. No pertinent family history.  As in HPI and primary teams notes otherwise 12 point review of systems is negative  Blood pressure 103/66, pulse 82,  temperature 97.3 F (36.3 C), temperature source Axillary, resp. rate 19, height 5\' 11"  (1.803 m), weight 194 lb 10.7 oz (88.3 kg), SpO2 100.00%. General: Alert and awake, oriented x3, not in any acute distress. HEENT: anicteric sclera, pupils reactive to light and accommodation, EOMI, oropharynx clear and without exudate, multiple fillings CVSirr, irr  rate, normal r, ii/vi systolic murmur RUSB,  Chest: dminished bs at the bases no wheezing, rales or rhonchi Abdomen: soft nontender, nondistended, normal bowel sounds, Extremities: Skin:  Bilateral TKA sites are clean  Left groin site clean, Hands without Oslers or Janeway lesions. Feet, Left toe is cyanotic, left 5th toe with area of ulceration and also cyanotic see pics below:          Neuro: nonfocal, strength and sensation intact   Results for orders placed during the hospital encounter of 08/13/2013 (from the past 48 hour(s))  PROCALCITONIN     Status: None   Collection Time    September 13, 2013  5:47 PM      Result Value Range   Procalcitonin 0.17     Comment:            Interpretation:     PCT (Procalcitonin) <= 0.5 ng/mL:     Systemic infection (sepsis) is not likely.     Local bacterial infection is possible.     (NOTE)             ICU PCT Algorithm               Non ICU PCT Algorithm        ----------------------------     ------------------------------              PCT < 0.25 ng/mL                 PCT < 0.1 ng/mL         Stopping of antibiotics            Stopping of antibiotics           strongly encouraged.               strongly encouraged.        ----------------------------     ------------------------------           PCT level decrease by               PCT < 0.25 ng/mL           >= 80% from peak PCT           OR PCT 0.25 - 0.5 ng/mL          Stopping of antibiotics                                                 encouraged.         Stopping of antibiotics               encouraged.        ----------------------------     ------------------------------           PCT level decrease by              PCT >= 0.25 ng/mL           < 80% from peak PCT  AND PCT >= 0.5 ng/mL            Continuing antibiotics                                                  encouraged.           Continuing antibiotics                encouraged.        ----------------------------     ------------------------------         PCT level increase compared          PCT > 0.5 ng/mL             with peak PCT AND              PCT >= 0.5 ng/mL             Escalation of antibiotics                                              strongly encouraged.          Escalation of antibiotics            strongly encouraged.  LACTIC ACID, PLASMA     Status: None   Collection Time    09/09/2013  6:30 PM      Result Value Range   Lactic Acid, Venous 1.8  0.5 - 2.2 mmol/L  CORTISOL     Status: None   Collection Time    September 09, 2013  6:30 PM      Result Value Range   Cortisol, Plasma 20.2     Comment: (NOTE)     AM:  4.3 - 22.4 ug/dL     PM:  3.1 - 16.1 ug/dL     Performed at [facility]  GLUCOSE, CAPILLARY     Status: Abnormal   Collection Time    09/09/13  7:55 PM      Result Value Range   Glucose-Capillary 179 (*) 70 - 99 mg/dL   Comment 1 Documented in Chart     Comment 2 Notify RN    CBC WITH DIFFERENTIAL     Status: Abnormal   Collection Time    09-09-2013  8:50 PM        Result Value Range   WBC 18.6 (*) 4.0 - 10.5 K/uL   RBC 3.30 (*) 4.22 - 5.81 MIL/uL   Hemoglobin 9.9 (*) 13.0 - 17.0 g/dL   HCT 09.6 (*) 04.5 - 40.9 %   MCV 85.2  78.0 - 100.0 fL   MCH 30.0  26.0 - 34.0 pg   MCHC 35.2  30.0 - 36.0 g/dL   RDW 81.1  91.4 - 78.2 %   Platelets 174  150 - 400 K/uL   Comment: DELTA CHECK NOTED   Neutrophils Relative % 87 (*) 43 - 77 %   Neutro Abs 16.2 (*) 1.7 - 7.7 K/uL   Lymphocytes Relative 9 (*) 12 - 46 %   Lymphs Abs 1.6  0.7 - 4.0 K/uL   Monocytes Relative 4  3 - 12 %   Monocytes Absolute 0.8  0.1 - 1.0 K/uL   Eosinophils Relative 0  0 - 5 %   Eosinophils Absolute 0.0  0.0 - 0.7 K/uL   Basophils Relative 0  0 - 1 %   Basophils Absolute 0.0  0.0 - 0.1 K/uL  COMPREHENSIVE METABOLIC PANEL     Status: Abnormal   Collection Time    September 14, 2013 10:16 PM      Result Value Range   Sodium 134 (*) 135 - 145 mEq/L   Potassium 4.3  3.5 - 5.1 mEq/L   Chloride 104  96 - 112 mEq/L   CO2 23  19 - 32 mEq/L   Glucose, Bld 147 (*) 70 - 99 mg/dL   BUN 17  6 - 23 mg/dL   Creatinine, Ser 1.61  0.50 - 1.35 mg/dL   Calcium 7.9 (*) 8.4 - 10.5 mg/dL   Total Protein 5.0 (*) 6.0 - 8.3 g/dL   Albumin 2.2 (*) 3.5 - 5.2 g/dL   AST 10  0 - 37 U/L   ALT 5  0 - 53 U/L   Alkaline Phosphatase 43  39 - 117 U/L   Total Bilirubin 0.9  0.3 - 1.2 mg/dL   GFR calc non Af Amer 82 (*) >90 mL/min   GFR calc Af Amer >90  >90 mL/min   Comment: (NOTE)     The eGFR has been calculated using the CKD EPI equation.     This calculation has not been validated in all clinical situations.     eGFR's persistently <90 mL/min signify possible Chronic Kidney     Disease.  OSMOLALITY     Status: None   Collection Time    2013-09-14 10:16 PM      Result Value Range   Osmolality 280  275 - 300 mOsm/kg   Comment: Performed at [facility]  TROPONIN I     Status: Abnormal   Collection Time    September 14, 2013 10:17 PM      Result Value Range   Troponin I 0.38 (*) <0.30 ng/mL   Comment:             Due to the release kinetics of cTnI,     a negative result within the first hours     of the onset of symptoms does not rule out     myocardial infarction with certainty.     If myocardial infarction is still suspected,     repeat the test at appropriate intervals.     REPEATED TO VERIFY     CRITICAL RESULT CALLED TO, READ BACK BY AND VERIFIED WITH:     [facility] September 14, 2013 2336 WAYK  CK TOTAL AND CKMB     Status: None   Collection Time    09-14-2013 10:17 PM      Result Value Range   Total CK 19  7 - 232 U/L   CK, MB 3.1  0.3 - 4.0 ng/mL   Relative Index RELATIVE INDEX IS INVALID  0.0 - 2.5   Comment: WHEN CK < 100 U/L             GLUCOSE, CAPILLARY     Status: Abnormal   Collection Time    08/18/13 12:17 AM      Result Value Range   Glucose-Capillary 118 (*) 70 - 99 mg/dL   Comment 1 Documented in Chart     Comment 2 Notify RN    TROPONIN I     Status: Abnormal   Collection Time    08/18/13  3:00 AM  Result Value Range   Troponin I 0.49 (*) <0.30 ng/mL   Comment:            Due to the release kinetics of cTnI,     a negative result within the first hours     of the onset of symptoms does not rule out     myocardial infarction with certainty.     If myocardial infarction is still suspected,     repeat the test at appropriate intervals.     REPEATED TO VERIFY     CRITICAL VALUE NOTED.  VALUE IS CONSISTENT WITH PREVIOUSLY REPORTED AND CALLED VALUE.  CK TOTAL AND CKMB     Status: None   Collection Time    08/18/13  3:00 AM      Result Value Range   Total CK 22  7 - 232 U/L   CK, MB 3.5  0.3 - 4.0 ng/mL   Relative Index RELATIVE INDEX IS INVALID  0.0 - 2.5   Comment: WHEN CK < 100 U/L             CBC     Status: Abnormal   Collection Time    08/18/13  3:00 AM      Result Value Range   WBC 19.3 (*) 4.0 - 10.5 K/uL   RBC 3.26 (*) 4.22 - 5.81 MIL/uL   Hemoglobin 9.8 (*) 13.0 - 17.0 g/dL   HCT 98.1 (*) 19.1 - 47.8 %   MCV 85.0  78.0 - 100.0 fL   MCH 30.1   26.0 - 34.0 pg   MCHC 35.4  30.0 - 36.0 g/dL   RDW 29.5  62.1 - 30.8 %   Platelets 210  150 - 400 K/uL  COMPREHENSIVE METABOLIC PANEL     Status: Abnormal   Collection Time    08/18/13  3:00 AM      Result Value Range   Sodium 133 (*) 135 - 145 mEq/L   Potassium 4.1  3.5 - 5.1 mEq/L   Chloride 101  96 - 112 mEq/L   CO2 20  19 - 32 mEq/L   Glucose, Bld 109 (*) 70 - 99 mg/dL   BUN 16  6 - 23 mg/dL   Creatinine, Ser 6.57  0.50 - 1.35 mg/dL   Calcium 8.1 (*) 8.4 - 10.5 mg/dL   Total Protein 5.5 (*) 6.0 - 8.3 g/dL   Albumin 2.4 (*) 3.5 - 5.2 g/dL   AST 11  0 - 37 U/L   ALT <5  0 - 53 U/L   Alkaline Phosphatase 48  39 - 117 U/L   Total Bilirubin 0.9  0.3 - 1.2 mg/dL   GFR calc non Af Amer 79 (*) >90 mL/min   GFR calc Af Amer >90  >90 mL/min   Comment: (NOTE)     The eGFR has been calculated using the CKD EPI equation.     This calculation has not been validated in all clinical situations.     eGFR's persistently <90 mL/min signify possible Chronic Kidney     Disease.  PROCALCITONIN     Status: None   Collection Time    08/18/13  3:00 AM      Result Value Range   Procalcitonin 0.31     Comment:            Interpretation:     PCT (Procalcitonin) <= 0.5 ng/mL:     Systemic infection (sepsis) is not likely.     Local  bacterial infection is possible.     (NOTE)             ICU PCT Algorithm               Non ICU PCT Algorithm        ----------------------------     ------------------------------             PCT < 0.25 ng/mL                 PCT < 0.1 ng/mL         Stopping of antibiotics            Stopping of antibiotics           strongly encouraged.               strongly encouraged.        ----------------------------     ------------------------------           PCT level decrease by               PCT < 0.25 ng/mL           >= 80% from peak PCT           OR PCT 0.25 - 0.5 ng/mL          Stopping of antibiotics                                                 encouraged.          Stopping of antibiotics               encouraged.        ----------------------------     ------------------------------           PCT level decrease by              PCT >= 0.25 ng/mL           < 80% from peak PCT            AND PCT >= 0.5 ng/mL            Continuing antibiotics                                                  encouraged.           Continuing antibiotics                encouraged.        ----------------------------     ------------------------------         PCT level increase compared          PCT > 0.5 ng/mL             with peak PCT AND              PCT >= 0.5 ng/mL             Escalation of antibiotics  strongly encouraged.          Escalation of antibiotics            strongly encouraged.  HEPARIN LEVEL (UNFRACTIONATED)     Status: Abnormal   Collection Time    08/18/13  4:00 AM      Result Value Range   Heparin Unfractionated 0.88 (*) 0.30 - 0.70 IU/mL   Comment:            IF HEPARIN RESULTS ARE BELOW     EXPECTED VALUES, AND PATIENT     DOSAGE HAS BEEN CONFIRMED,     SUGGEST FOLLOW UP TESTING     OF ANTITHROMBIN III LEVELS.  GLUCOSE, CAPILLARY     Status: Abnormal   Collection Time    08/18/13  4:28 AM      Result Value Range   Glucose-Capillary 125 (*) 70 - 99 mg/dL   Comment 1 Documented in Chart     Comment 2 Notify RN    APTT     Status: Abnormal   Collection Time    08/18/13  5:51 AM      Result Value Range   aPTT 120 (*) 24 - 37 seconds   Comment:            IF BASELINE aPTT IS ELEVATED,     SUGGEST PATIENT RISK ASSESSMENT     BE USED TO DETERMINE APPROPRIATE     ANTICOAGULANT THERAPY.  GLUCOSE, CAPILLARY     Status: Abnormal   Collection Time    08/18/13  7:59 AM      Result Value Range   Glucose-Capillary 112 (*) 70 - 99 mg/dL   Comment 1 Notify RN    GLUCOSE, CAPILLARY     Status: Abnormal   Collection Time    08/18/13 12:30 PM      Result Value Range   Glucose-Capillary 110 (*) 70 -  99 mg/dL   Comment 1 Notify RN    HEPARIN LEVEL (UNFRACTIONATED)     Status: None   Collection Time    08/18/13  1:00 PM      Result Value Range   Heparin Unfractionated 0.39  0.30 - 0.70 IU/mL   Comment:            IF HEPARIN RESULTS ARE BELOW     EXPECTED VALUES, AND PATIENT     DOSAGE HAS BEEN CONFIRMED,     SUGGEST FOLLOW UP TESTING     OF ANTITHROMBIN III LEVELS.  GLUCOSE, CAPILLARY     Status: Abnormal   Collection Time    08/18/13  4:41 PM      Result Value Range   Glucose-Capillary 113 (*) 70 - 99 mg/dL   Comment 1 Notify RN    GLUCOSE, CAPILLARY     Status: Abnormal   Collection Time    08/18/13  7:40 PM      Result Value Range   Glucose-Capillary 101 (*) 70 - 99 mg/dL   Comment 1 Documented in Chart     Comment 2 Notify RN    GLUCOSE, CAPILLARY     Status: None   Collection Time    08/19/13 12:07 AM      Result Value Range   Glucose-Capillary 95  70 - 99 mg/dL   Comment 1 Documented in Chart     Comment 2 Notify RN    GLUCOSE, CAPILLARY     Status: None   Collection Time    08/19/13  4:11  AM      Result Value Range   Glucose-Capillary 93  70 - 99 mg/dL   Comment 1 Documented in Chart     Comment 2 Notify RN    HEPARIN LEVEL (UNFRACTIONATED)     Status: None   Collection Time    08/19/13  4:23 AM      Result Value Range   Heparin Unfractionated 0.31  0.30 - 0.70 IU/mL   Comment:            IF HEPARIN RESULTS ARE BELOW     EXPECTED VALUES, AND PATIENT     DOSAGE HAS BEEN CONFIRMED,     SUGGEST FOLLOW UP TESTING     OF ANTITHROMBIN III LEVELS.  CBC     Status: Abnormal   Collection Time    08/19/13  4:23 AM      Result Value Range   WBC 14.0 (*) 4.0 - 10.5 K/uL   RBC 2.90 (*) 4.22 - 5.81 MIL/uL   Hemoglobin 8.7 (*) 13.0 - 17.0 g/dL   HCT 16.1 (*) 09.6 - 04.5 %   MCV 85.2  78.0 - 100.0 fL   MCH 30.0  26.0 - 34.0 pg   MCHC 35.2  30.0 - 36.0 g/dL   RDW 40.9 (*) 81.1 - 91.4 %   Platelets 199  150 - 400 K/uL  COMPREHENSIVE METABOLIC PANEL     Status:  Abnormal   Collection Time    08/19/13  4:23 AM      Result Value Range   Sodium 136  135 - 145 mEq/L   Potassium 3.5  3.5 - 5.1 mEq/L   Chloride 106  96 - 112 mEq/L   CO2 19  19 - 32 mEq/L   Glucose, Bld 98  70 - 99 mg/dL   BUN 10  6 - 23 mg/dL   Creatinine, Ser 7.82  0.50 - 1.35 mg/dL   Calcium 7.7 (*) 8.4 - 10.5 mg/dL   Total Protein 5.1 (*) 6.0 - 8.3 g/dL   Albumin 2.0 (*) 3.5 - 5.2 g/dL   AST 23  0 - 37 U/L   ALT <5  0 - 53 U/L   Alkaline Phosphatase 50  39 - 117 U/L   Total Bilirubin 0.7  0.3 - 1.2 mg/dL   GFR calc non Af Amer 83 (*) >90 mL/min   GFR calc Af Amer >90  >90 mL/min   Comment: (NOTE)     The eGFR has been calculated using the CKD EPI equation.     This calculation has not been validated in all clinical situations.     eGFR's persistently <90 mL/min signify possible Chronic Kidney     Disease.  PROCALCITONIN     Status: None   Collection Time    08/19/13  4:23 AM      Result Value Range   Procalcitonin 0.30     Comment:            Interpretation:     PCT (Procalcitonin) <= 0.5 ng/mL:     Systemic infection (sepsis) is not likely.     Local bacterial infection is possible.     (NOTE)             ICU PCT Algorithm               Non ICU PCT Algorithm        ----------------------------     ------------------------------  PCT < 0.25 ng/mL                 PCT < 0.1 ng/mL         Stopping of antibiotics            Stopping of antibiotics           strongly encouraged.               strongly encouraged.        ----------------------------     ------------------------------           PCT level decrease by               PCT < 0.25 ng/mL           >= 80% from peak PCT           OR PCT 0.25 - 0.5 ng/mL          Stopping of antibiotics                                                 encouraged.         Stopping of antibiotics               encouraged.        ----------------------------     ------------------------------           PCT level decrease  by              PCT >= 0.25 ng/mL           < 80% from peak PCT            AND PCT >= 0.5 ng/mL            Continuing antibiotics                                                  encouraged.           Continuing antibiotics                encouraged.        ----------------------------     ------------------------------         PCT level increase compared          PCT > 0.5 ng/mL             with peak PCT AND              PCT >= 0.5 ng/mL             Escalation of antibiotics                                              strongly encouraged.          Escalation of antibiotics            strongly encouraged.  GLUCOSE, CAPILLARY     Status: Abnormal   Collection Time    08/19/13  7:59 AM      Result Value Range   Glucose-Capillary 105 (*) 70 -  99 mg/dL  GLUCOSE, CAPILLARY     Status: Abnormal   Collection Time    08/19/13 11:55 AM      Result Value Range   Glucose-Capillary 109 (*) 70 - 99 mg/dL  GLUCOSE, CAPILLARY     Status: Abnormal   Collection Time    08/19/13  3:39 PM      Result Value Range   Glucose-Capillary 113 (*) 70 - 99 mg/dL   No results found for this basename: sdes, specrequest, cult, reptstatus   Dg Chest Port 1 View  08/18/2013   CLINICAL DATA:  Dyspnea  EXAM: PORTABLE CHEST - 1 VIEW  COMPARISON:  08/26/2013  FINDINGS: Changes from cardiac surgery are stable. No pneumothorax. No pleural effusion.  No lung consolidation or pulmonary edema.  Right internal jugular central venous line is stable and well positioned.  IMPRESSION: No acute cardiopulmonary disease. No change from the previous day's study.   Electronically Signed   By: Amie Portland M.D.   On: 08/18/2013 07:57   Dg Chest Port 1 View  08/31/2013   CLINICAL DATA:  Central line placement, leukocytosis  EXAM: PORTABLE CHEST - 1 VIEW  COMPARISON:  08/06/2013  FINDINGS: Grossly unchanged enlarged cardiac silhouette and mediastinal contours post median sternotomy and CABG. Interval placement of right jugular  approach central venous catheter with tip projected over the superior cavoatrial junction. No pneumothorax. Mild pulmonary venous congestion without frank evidence of edema. Minimal left basilar/retrocardiac atelectasis. No pleural effusion. Unchanged bones.  IMPRESSION: 1. Right jugular approach central venous catheter tip projects over the superior cavoatrial junction. No pneumothorax. 2. Pulmonary venous congestion without frank evidence of edema. 3. Minimal left basilar atelectasis.   Electronically Signed   By: Simonne Come M.D.   On: 08/11/2013 17:11     No results found for this or any previous visit (from the past 720 hour(s)).   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    If pt truly has a perivalvular abscess and is not an operative candidate then would continue IV abx but also would obtain palliative care consult  #2 PAF: on anticoagulation.    #3 Screening: will check HIV and Hep panel    Thank you so much for this interesting consult  [facility] [phone] (pager) [phone] (office) 08/19/2013, 4:11 PM  Paulette Blanch Dam 08/19/2013, 4:11 PM

## 2013-08-20 DIAGNOSIS — Z113 Encounter for screening for infections with a predominantly sexual mode of transmission: Secondary | ICD-10-CM

## 2013-08-20 DIAGNOSIS — I2690 Septic pulmonary embolism without acute cor pulmonale: Secondary | ICD-10-CM

## 2013-08-20 DIAGNOSIS — Z9889 Other specified postprocedural states: Secondary | ICD-10-CM

## 2013-08-20 DIAGNOSIS — I330 Acute and subacute infective endocarditis: Secondary | ICD-10-CM

## 2013-08-20 DIAGNOSIS — I359 Nonrheumatic aortic valve disorder, unspecified: Secondary | ICD-10-CM

## 2013-08-20 LAB — CBC
HCT: 24 % — ABNORMAL LOW (ref 39.0–52.0)
Hemoglobin: 8.3 g/dL — ABNORMAL LOW (ref 13.0–17.0)
MCH: 29.7 pg (ref 26.0–34.0)
MCV: 86 fL (ref 78.0–100.0)
RBC: 2.79 MIL/uL — ABNORMAL LOW (ref 4.22–5.81)
RDW: 15.4 % (ref 11.5–15.5)

## 2013-08-20 LAB — GLUCOSE, CAPILLARY
Glucose-Capillary: 91 mg/dL (ref 70–99)
Glucose-Capillary: 93 mg/dL (ref 70–99)

## 2013-08-20 LAB — BASIC METABOLIC PANEL
BUN: 8 mg/dL (ref 6–23)
CO2: 20 mEq/L (ref 19–32)
Calcium: 7.4 mg/dL — ABNORMAL LOW (ref 8.4–10.5)
Creatinine, Ser: 0.67 mg/dL (ref 0.50–1.35)
GFR calc non Af Amer: >90 mL/min (ref >90)
Glucose, Bld: 93 mg/dL (ref 70–99)

## 2013-08-20 LAB — GENTAMICIN LEVEL, TROUGH: Gentamicin Trough: 2 ug/mL (ref 0.5–2.0)

## 2013-08-20 MED ORDER — POTASSIUM CHLORIDE CRYS ER 20 MEQ PO TBCR
20.0000 meq | EXTENDED_RELEASE_TABLET | ORAL | Status: AC
  Administered 2013-08-20 (×2): 20 meq via ORAL
  Filled 2013-08-20 (×2): qty 1

## 2013-08-20 MED ORDER — LORAZEPAM 0.5 MG PO TABS
0.5000 mg | ORAL_TABLET | Freq: Once | ORAL | Status: AC
  Administered 2013-08-20: 0.5 mg via ORAL
  Filled 2013-08-20: qty 1

## 2013-08-20 MED ORDER — SODIUM CHLORIDE 0.9 % IV SOLN: INTRAVENOUS | Status: DC

## 2013-08-20 MED ORDER — GENTAMICIN IN SALINE 1.2-0.9 MG/ML-% IV SOLN
60.0000 mg | Freq: Two times a day (BID) | INTRAVENOUS | Status: DC
  Administered 2013-08-21 – 2013-08-23 (×5): 60 mg via INTRAVENOUS
  Filled 2013-08-20 (×6): qty 50

## 2013-08-20 MED ORDER — POLYVINYL ALCOHOL 1.4 % OP SOLN
1.0000 [drp] | OPHTHALMIC | Status: DC | PRN
  Administered 2013-08-22: 1 [drp] via OPHTHALMIC
  Filled 2013-08-20 (×2): qty 15

## 2013-08-20 MED ORDER — ENOXAPARIN SODIUM 100 MG/ML ~~LOC~~ SOLN
95.0000 mg | Freq: Two times a day (BID) | SUBCUTANEOUS | Status: DC
  Administered 2013-08-20: 95 mg via SUBCUTANEOUS
  Administered 2013-08-20 – 2013-08-21 (×2): via SUBCUTANEOUS
  Administered 2013-08-21: 95 mg via SUBCUTANEOUS
  Administered 2013-08-22: 21:00:00 via SUBCUTANEOUS
  Administered 2013-08-22 – 2013-08-24 (×4): 95 mg via SUBCUTANEOUS
  Filled 2013-08-20 (×14): qty 1

## 2013-08-20 NOTE — Progress Notes (Signed)
Subjective: Still has mild pain in left foot. No other complaints.   Objective: Vital signs in last 24 hours: Temp:  [97.3 F (36.3 C)-98.9 F (37.2 C)] 97.3 F (36.3 C) (11/18 0754) Pulse Rate:  [43-108] 82 (11/18 0700) Resp:  [14-25] 20 (11/18 0700) BP: (83-114)/(54-71) 114/66 mmHg (11/18 0700) SpO2:  [95 %-100 %] 98 % (11/18 0700) Weight:  [215 lb 6.2 oz (97.7 kg)] 215 lb 6.2 oz (97.7 kg) (11/18 0600)    Intake/Output from previous day: 11/17 0701 - 11/18 0700 In: 3231.5 [P.O.:240; I.V.:2135.5; IV Piggyback:856] Out: 1595 [Urine:1595] Intake/Output this shift: Total I/O In: 250 [IV Piggyback:250] Out: -   Medications Current Facility-Administered Medications  Medication Dose Route Frequency Provider Last Rate Last Dose  . 0.9 %  sodium chloride infusion   Intravenous Continuous Fransisco Hertz, MD 75 mL/hr at 08/20/13 0600    . 0.9 %  sodium chloride infusion   Intravenous Continuous Storm Frisk, MD      . acetaminophen (TYLENOL) tablet 325-650 mg  325-650 mg Oral Q4H PRN Fransisco Hertz, MD       Or  . acetaminophen (TYLENOL) suppository 325-650 mg  325-650 mg Rectal Q4H PRN Fransisco Hertz, MD      . alum & mag hydroxide-simeth (MAALOX/MYLANTA) 200-200-20 MG/5ML suspension 15-30 mL  15-30 mL Oral Q2H PRN Fransisco Hertz, MD      . antiseptic oral rinse (BIOTENE) solution 15 mL  15 mL Mouth Rinse q12n4p Oretha Milch, MD   15 mL at 08/19/13 1600  . atorvastatin (LIPITOR) tablet 10 mg  10 mg Oral q1800 Fransisco Hertz, MD   10 mg at 08/19/13 1703  . bisacodyl (DULCOLAX) suppository 10 mg  10 mg Rectal Daily PRN Fransisco Hertz, MD      . ceFEPIme (MAXIPIME) 1 g in dextrose 5 % 50 mL IVPB  1 g Intravenous Q8H Storm Frisk, MD   1 g at 08/20/13 0210  . chlorhexidine (PERIDEX) 0.12 % solution 15 mL  15 mL Mouth Rinse BID Oretha Milch, MD   15 mL at 08/20/13 0742  . gentamicin (GARAMYCIN) 80 mg in dextrose 5 % 50 mL IVPB  80 mg Intravenous Q8H Randall Hiss, MD   80 mg at  08/20/13 0459  . guaiFENesin-dextromethorphan (ROBITUSSIN DM) 100-10 MG/5ML syrup 15 mL  15 mL Oral Q4H PRN Fransisco Hertz, MD      . insulin aspart (novoLOG) injection 0-9 Units  0-9 Units Subcutaneous Q4H Hillary Bow, DO   1 Units at 08/18/13 0430  . morphine 2 MG/ML injection 2 mg  2 mg Intravenous Q1H PRN Oretha Milch, MD   2 mg at 08/20/13 0411  . ondansetron (ZOFRAN) injection 4 mg  4 mg Intravenous Q6H PRN Fransisco Hertz, MD      . oxyCODONE-acetaminophen (PERCOCET/ROXICET) 5-325 MG per tablet 1-2 tablet  1-2 tablet Oral Q4H PRN Fransisco Hertz, MD      . pantoprazole (PROTONIX) injection 40 mg  40 mg Intravenous Q24H Oretha Milch, MD   40 mg at 08/19/13 1703  . phenol (CHLORASEPTIC) mouth spray 1 spray  1 spray Mouth/Throat PRN Fransisco Hertz, MD   1 spray at 08/18/13 1757  . phenylephrine (NEO-SYNEPHRINE) 40 mg in dextrose 5 % 250 mL infusion  30-200 mcg/min Intravenous Titrated Storm Frisk, MD   10 mcg/min at 08/19/13 1600  . potassium chloride SA (K-DUR,KLOR-CON) CR  tablet 20 mEq  20 mEq Oral Q4H Zigmund Gottron, MD   20 mEq at 08/20/13 0742  . rifampin (RIFADIN) 300 mg in sodium chloride 0.9 % 100 mL IVPB  300 mg Intravenous Q8H Randall Hiss, MD   300 mg at 08/20/13 0039  . senna-docusate (Senokot-S) tablet 1 tablet  1 tablet Oral QHS PRN Fransisco Hertz, MD      . sodium chloride 0.9 % injection 3 mL  3 mL Intravenous Q12H Hillary Bow, DO   3 mL at 08/19/13 2200  . vancomycin (VANCOCIN) 1,250 mg in sodium chloride 0.9 % 250 mL IVPB  1,250 mg Intravenous Q12H Gala Lewandowsky [place], RPH   1,250 mg at 08/20/13 2130    PE: General appearance: alert, cooperative and no distress Lungs: clear to auscultation bilaterally Heart: regular rate and rhythm and 3/6 SM ; no DM Extremities: trace LEE, cyanotic 1st and 5th digit of the left foot. Bilateral hands are free from splinter hemorrhages, osler nodes and janeway lesions.  Pulses: 1+ DP Skin: warm and dry Neurologic: Grossly  normal  Lab Results:   Recent Labs  08/18/13 0300 08/19/13 0423 08/20/13 0405  WBC 19.3* 14.0* 10.4  HGB 9.8* 8.7* 8.3*  HCT 27.7* 24.7* 24.0*  PLT 210 199 206   BMET  Recent Labs  08/18/13 0300 08/19/13 0423 08/20/13 0405  NA 133* 136 134*  K 4.1 3.5 3.3*  CL 101 106 105  CO2 20 19 20   GLUCOSE 109* 98 93  BUN 16 10 8   CREATININE 0.96 0.85 0.67  CALCIUM 8.1* 7.7* 7.4*   Cardiac Panel (last 3 results)  Recent Labs  08/27/2013 1528 08/13/2013 2217 08/18/13 0300  CKTOTAL 14 19 22   CKMB 2.1 3.1 3.5  TROPONINI <0.30 0.38* 0.49*  RELINDX RELATIVE INDEX IS INVALID RELATIVE INDEX IS INVALID RELATIVE INDEX IS INVALID    Studies/Results:  TTE Study Conclusions  - Aortic valve: There is a large echo free space separating the prosthetic valve from the aortic annulus, concerning for perivalvular abscess. TEE is recommended. Calcified annulus. Mildly thickened leaflets. A bioprosthesis was present. Moderate focal thickening and calcification.  Cannot exclude vegetation. There was an apparent, medium-sized, mobile vegetation on the aortic aspect of the left coronary cusp; the abnormality is new since the previous study; the appearance is consistent with vegetation. Transvalvular velocity was increased more than expected. There was at least moderate stenosis, hwoever this may be underestimated in the setting of Sinus Tachycardia. - Left ventricle: The cavity size was normal. Wall thickness was mildly increased. There was mild concentric hypertrophy, with an appearance suggesting concentric remodeling (increased wall thickness with normal wall mass). Systolic function was hyperdynamic. The estimated ejection fraction was in the range of 50% to 60%. Due to tachycardia, there was fusion of early and atrial contributions to ventricular filling. - Regional wall motion abnormality: Moderate hypokinesis of the mid inferior myocardium; mild hypokinesis of the mid inferoseptal, basal inferior,  and apical septal myocardium. - Ventricular septum: Septal motion showed paradox. These changes are consistent with intraventricular conduction delay. - Mitral valve: Calcified annulus. Mildly thickened leaflets . Calcification. Mild focal calcification, with involvement of chords. Mild regurgitation. - Left atrium: The atrium was moderately to severely dilated. - Right atrium: The atrium was mildly dilated. - Pulmonary arteries: Systolic pressure was mildly to moderately increased. PA peak pressure: 45mm Hg (S). Impressions:  - Borderline significant aortic stenosis, status post bio-prosthetic replacement in the setting of a mobile mass  that is most consistent with endocarditis/vegetation. Will review with TEE MDs. Vegetation, consistent with possible endocarditis.   Assessment/Plan  Principal Problem:   Critical lower limb ischemia Active Problems:   H/O aortic valve replacement with porcine valve; 23 mm Edwards Pericardial Magna Ease.-2000   OSA on CPAP   DVT (deep venous thrombosis)   Paroxysmal atrial flutter   Hemorrhagic shock   Acute blood loss anemia   CAD in native artery - s/p CABG in 2000   Prosthetic aortic valve vegetation   Type 2 diabetes mellitus with complications   Chronic anticoagulation   HTN (hypertension)   Dyslipidemia  Plan: TTE on 11/16 demonstrated a mobile mass/vegitation on the bio-prosthetic aortic valve, concerning for endocarditis. Plan for TEE tomorrow with Dr. Royann Shivers. ID is managing empiric antibiotics. He remains afebrile. Pulse is regular. WBC now WNL. BP stable. Will make NPO at midnight. Replete K+ for hypokalemia. MD to follow.    LOS: 4 days    Brittainy M. Delmer Islam 08/20/2013 8:38 AM  Very interesting situation.  Off Pressors & no SSx of systemic infection leading up to his admission.  Reviewed 2 D echo with Dr. Oran Rein agrees that we need TEE - scheduled tomorrow.  We have contacted Dr.Van Trigt re the potential consult, he  is aware of upcoming TEE.    Remains afebrile & now is hemodynamically stable with no signs of systemic infection. On Abx & cultures have been drawn.   HR has now much improved. Neurologically significantly improved compared to yesterday.  Continues on Anticoagulation - Enoxaparin. EF essentially preserved, but would be cautious of volume overload.  Marykay Lex, M.D., M.S. [facility] [address] [place], [place]  [address]  [phone] Pager # [phone] 08/20/2013 12:40 PM

## 2013-08-20 NOTE — Progress Notes (Signed)
PHARMACY FOLLOW UP NOTE   Pharmacy Consult for : Gentamicin synergy Indication:  Aortic valve endocarditis with perivalvular abscess  Labs:  Recent Labs  08/18/13 0300 08/18/13 0400 08/18/13 0551 08/18/13 1300 08/19/13 0423 08/20/13 0405  HGB 9.8*  --   --   --  8.7* 8.3*  HCT 27.7*  --   --   --  24.7* 24.0*  PLT 210  --   --   --  199 206  APTT  --   --  120*  --   --   --   HEPARINUNFRC  --  0.88*  --  0.39 0.31 0.41  CREATININE 0.96  --   --   --  0.85 0.67   Gentamicin trough 2.0  Estimated Creatinine Clearance: 93.7 ml/min (by C-G formula based on Cr of 0.67). Assessment 78 yo male with endocarditis for empiric antibiotics  Goal: Gentamicin trough < 1   Plan: Change gentamicin 60 mg IV q12h  Geannie Risen, PharmD, BCPS 08/20/2013, 11:31 PM

## 2013-08-20 NOTE — Progress Notes (Signed)
Pt for TEE TOMORROW WITH DR ZOXWRUEA AT 1500

## 2013-08-20 NOTE — Progress Notes (Signed)
Patient very anxious about TEE tomorrow Also  Takes schedule 1mg  ativan QHS but currently on none  Plan Ativan 0.5mg  x 1 now   Dr. Kalman Shan, M.D., [facility].C.P Pulmonary and Critical Care Medicine Staff Physician  System [place] [facility] Pager: [phone], If no answer or between  15:00h - 7:00h: call [phone]  08/20/2013 10:57 PM

## 2013-08-20 NOTE — Progress Notes (Signed)
PHARMACY FOLLOW UP NOTE   Pharmacy Consult for : 1. Change Heparin infusion to Lovenox : 2. Vancomycin, Gentamicin Indication: 1. Critical left limb ischemia s/p embolectomy/angioplasty related to AV veg : 2. Aortic valve endocarditis with perivalvular abscess  Dosing Weight: 1. Vancomycin 98 kg, 2. Gentamicin [LBW] 75.3 kg  Labs:  Recent Labs  08/07/2013 1504 08/09/2013 2050 08/20/2013 2216 08/18/13 0300 08/18/13 0400 08/18/13 0551 08/18/13 1300 08/19/13 0423 08/20/13 0405  HGB 9.9* 9.9*  --  9.8*  --   --   --  8.7* 8.3*  HCT 29.0* 28.1*  --  27.7*  --   --   --  24.7* 24.0*  PLT  --  174  --  210  --   --   --  199 206  APTT  --   --   --   --   --  120*  --   --   --   HEPARINUNFRC  --   --   --   --  0.88*  --  0.39 0.31 0.41  CREATININE  --   --  0.87 0.96  --   --   --  0.85 0.67   Lab Results  Component Value Date   INR 0.98 04/02/2013   INR 1.06 11/21/2012   INR 0.93 09/06/2010    Estimated Creatinine Clearance: 93.7 ml/min (by C-G formula based on Cr of 0.67).  Pertinent Medications:  Scheduled:  . antiseptic oral rinse  15 mL Mouth Rinse q12n4p  . atorvastatin  10 mg Oral q1800  . ceFEPime (MAXIPIME) IV  1 g Intravenous Q8H  . chlorhexidine  15 mL Mouth Rinse BID  . enoxaparin (LOVENOX) injection  95 mg Subcutaneous Q12H  . gentamicin  80 mg Intravenous Q8H  . insulin aspart  0-9 Units Subcutaneous Q4H  . potassium chloride  20 mEq Oral Q4H  . rifampin (RIFADIN) IVPB  300 mg Intravenous Q8H  . sodium chloride  3 mL Intravenous Q12H  . vancomycin  1,250 mg Intravenous Q12H   Infusions:  . sodium chloride 75 mL/hr at 08/20/13 0600  . sodium chloride Stopped (08/12/2013 1900)   Anti-infectives   Start     Dose/Rate Route Frequency Ordered Stop   08/19/13 1830  ceFEPIme (MAXIPIME) 1 g in dextrose 5 % 50 mL IVPB     1 g 100 mL/hr over 30 Minutes Intravenous Every 8 hours 08/19/13 1726     08/19/13 1700  rifampin (RIFADIN) 300 mg in sodium chloride 0.9 % 100 mL  IVPB     300 mg 200 mL/hr over 30 Minutes Intravenous Every 8 hours 08/19/13 1612     08/19/13 1300  gentamicin (GARAMYCIN) 80 mg in dextrose 5 % 50 mL IVPB     75 mg 104 mL/hr over 30 Minutes Intravenous Every 8 hours 08/19/13 1154     08/19/13 1145  cefTRIAXone (ROCEPHIN) 2 g in dextrose 5 % 50 mL IVPB  Status:  Discontinued     2 g 100 mL/hr over 30 Minutes Intravenous Every 24 hours 08/19/13 1115 08/19/13 1714   08/19/13 0700  vancomycin (VANCOCIN) 1,250 mg in sodium chloride 0.9 % 250 mL IVPB     1,250 mg 166.7 mL/hr over 90 Minutes Intravenous Every 12 hours 08/18/13 1909     08/19/13 0300  piperacillin-tazobactam (ZOSYN) IVPB 3.375 g  Status:  Discontinued     3.375 g 12.5 mL/hr over 240 Minutes Intravenous Every 8 hours 08/18/13 1909 08/19/13 1115  08/18/13 1915  piperacillin-tazobactam (ZOSYN) IVPB 3.375 g     3.375 g 100 mL/hr over 30 Minutes Intravenous  Once 08/18/13 1909 08/18/13 2046   08/18/13 1915  vancomycin (VANCOCIN) 1,500 mg in sodium chloride 0.9 % 500 mL IVPB     1,500 mg 250 mL/hr over 120 Minutes Intravenous  Once 08/18/13 1909 08/18/13 2217   08/23/2013 1900  cefUROXime (ZINACEF) 1.5 g in dextrose 5 % 50 mL IVPB     1.5 g 100 mL/hr over 30 Minutes Intravenous Every 12 hours 08/31/2013 1746 08/18/13 0702   08/07/2013 0857  ceFAZolin (ANCEF) 2-3 GM-% IVPB SOLR    Comments:  Brien Mates   : cabinet override      08/31/2013 0857 08/14/2013 2114      Assessment:  78 y/o male on full dose anticoagulation for critical left limb ischemia s/p embolectomy/angioplasty related to AV veg.  He is also on Gentamicin and Vancomycin, in addition to Rifampin and Cefepime for Aortic valve endocarditis with perivalvular abscess  Patient is afebrile.  WBC's down 19.3 > 14 > 10.4.  Renal function is improving, est CrCl > 90 ml/min  Patient will be placed on Lovenox, full dose, after discontinuation of Heparin infusion.  Goal:  Anti-Xa level 0.6-1.2 units/ml 4hrs after LMWH  dose given  Gentamicin trough level < 1 mcg/ml, [peaks for synergy 3 - 4 mcg/ml]  Vancomycin trough 15 - 20 mcg/ml.   Plan: 1. DC Heparin [done]. 2. Begin Lovenox 95 mg sq q 12 hours. 3. Gentamicin trough level, Vancomycin trough level tonight. Follow up antibiotic levels, SCr, UOP, cultures, clinical course and adjust as clinically indicated.   Kareena Arrambide, Deetta Perla.D 08/20/2013, 9:59 AM

## 2013-08-20 NOTE — Progress Notes (Signed)
[facility]   Day # 2 cefepime Day # 2 gentamicin Day # 3 vancomycin  Subjective: No new complaints   Antibiotics:  Anti-infectives   Start     Dose/Rate Route Frequency Ordered Stop   08/19/13 1830  ceFEPIme (MAXIPIME) 1 g in dextrose 5 % 50 mL IVPB     1 g 100 mL/hr over 30 Minutes Intravenous Every 8 hours 08/19/13 1726     08/19/13 1700  rifampin (RIFADIN) 300 mg in sodium chloride 0.9 % 100 mL IVPB     300 mg 200 mL/hr over 30 Minutes Intravenous Every 8 hours 08/19/13 1612     08/19/13 1300  gentamicin (GARAMYCIN) 80 mg in dextrose 5 % 50 mL IVPB     75 mg 104 mL/hr over 30 Minutes Intravenous Every 8 hours 08/19/13 1154     08/19/13 1145  cefTRIAXone (ROCEPHIN) 2 g in dextrose 5 % 50 mL IVPB  Status:  Discontinued     2 g 100 mL/hr over 30 Minutes Intravenous Every 24 hours 08/19/13 1115 08/19/13 1714   08/19/13 0700  vancomycin (VANCOCIN) 1,250 mg in sodium chloride 0.9 % 250 mL IVPB     1,250 mg 166.7 mL/hr over 90 Minutes Intravenous Every 12 hours 08/18/13 1909     08/19/13 0300  piperacillin-tazobactam (ZOSYN) IVPB 3.375 g  Status:  Discontinued     3.375 g 12.5 mL/hr over 240 Minutes Intravenous Every 8 hours 08/18/13 1909 08/19/13 1115   08/18/13 1915  piperacillin-tazobactam (ZOSYN) IVPB 3.375 g     3.375 g 100 mL/hr over 30 Minutes Intravenous  Once 08/18/13 1909 08/18/13 2046   08/18/13 1915  vancomycin (VANCOCIN) 1,500 mg in sodium chloride 0.9 % 500 mL IVPB     1,500 mg 250 mL/hr over 120 Minutes Intravenous  Once 08/18/13 1909 08/18/13 2217   08/13/2013 1900  cefUROXime (ZINACEF) 1.5 g in dextrose 5 % 50 mL IVPB     1.5 g 100 mL/hr over 30 Minutes Intravenous Every 12 hours 08/20/2013 1746 08/18/13 0702   08/12/2013 0857  ceFAZolin (ANCEF) 2-3 GM-% IVPB SOLR    Comments:  Brien Mates   : cabinet override      08/14/2013 0857 09/01/2013 2114      Medications: Scheduled Meds: . antiseptic oral rinse  15 mL Mouth Rinse q12n4p    . atorvastatin  10 mg Oral q1800  . ceFEPime (MAXIPIME) IV  1 g Intravenous Q8H  . chlorhexidine  15 mL Mouth Rinse BID  . enoxaparin (LOVENOX) injection  95 mg Subcutaneous Q12H  . gentamicin  80 mg Intravenous Q8H  . insulin aspart  0-9 Units Subcutaneous Q4H  . rifampin (RIFADIN) IVPB  300 mg Intravenous Q8H  . sodium chloride  3 mL Intravenous Q12H  . vancomycin  1,250 mg Intravenous Q12H   Continuous Infusions: . sodium chloride 75 mL/hr at 08/20/13 0800  . sodium chloride Stopped (08/24/2013 1900)   PRN Meds:.acetaminophen, acetaminophen, alum & mag hydroxide-simeth, bisacodyl, guaiFENesin-dextromethorphan, ondansetron, oxyCODONE-acetaminophen, phenol, polyvinyl alcohol, senna-docusate   Objective: Weight change:   Intake/Output Summary (Last 24 hours) at 08/20/13 1644 Last data filed at 08/20/13 1400  Gross per 24 hour  Intake 3662.8 ml  Output   1215 ml  Net 2447.8 ml   Blood pressure 86/57, pulse 85, temperature 97.2 F (36.2 C), temperature source Oral, resp. rate 21, height 5\' 11"  (1.803 m), weight 215 lb 6.2 oz (97.7 kg), SpO2 98.00%. Temp:  [97.2 F (  36.2 C)-98.2 F (36.8 C)] 97.2 F (36.2 C) (11/18 1212) Pulse Rate:  [69-97] 85 (11/18 1500) Resp:  [15-25] 21 (11/18 1500) BP: (83-114)/(51-71) 86/57 mmHg (11/18 1500) SpO2:  [96 %-100 %] 98 % (11/18 1500) Weight:  [215 lb 6.2 oz (97.7 kg)] 215 lb 6.2 oz (97.7 kg) (11/18 0600)  Physical Exam: General: Alert and awake, oriented x3, not in any acute distress.  HEENT: anicteric sclera, pupils reactive to light and accommodation, EOMI, oropharynx clear and without exudate, multiple fillings , neck with central line CVSirr, irr rate, normal r, ii/vi systolic murmur RUSB,  Chest: dminished bs at the bases no wheezing, rales or rhonchi  Abdomen: soft nontender, nondistended, normal bowel sounds,  Extremities: Skin:  Bilateral TKA sites are clean  Left groin site clean, Hands without Oslers or Janeway lesions. Feet,  Left toe is cyanotic, left 5th toe with area of ulceration and also cyanotic  Neuro nonfocal  Lab Results:  Recent Labs  08/19/13 0423 08/20/13 0405  WBC 14.0* 10.4  HGB 8.7* 8.3*  HCT 24.7* 24.0*  PLT 199 206    BMET  Recent Labs  08/19/13 0423 08/20/13 0405  NA 136 134*  K 3.5 3.3*  CL 106 105  CO2 19 20  GLUCOSE 98 93  BUN 10 8  CREATININE 0.85 0.67  CALCIUM 7.7* 7.4*    Micro Results: Recent Results (from the past 240 hour(s))  CULTURE, BLOOD (ROUTINE X 2)     Status: None   Collection Time    08/18/13  8:00 PM      Result Value Range Status   Specimen Description BLOOD RIGHT ARM   Final   Special Requests BOTTLES DRAWN AEROBIC AND ANAEROBIC 5CC EA   Final   Culture  Setup Time     Final   Value: 08/19/2013 04:33     Performed at [facility]   Culture     Final   Value:        BLOOD CULTURE RECEIVED NO GROWTH TO DATE CULTURE WILL BE HELD FOR 5 DAYS BEFORE ISSUING A FINAL NEGATIVE REPORT     Performed at [facility]   Report Status PENDING   Incomplete  CULTURE, BLOOD (ROUTINE X 2)     Status: None   Collection Time    08/18/13  8:10 PM      Result Value Range Status   Specimen Description BLOOD RIGHT HAND   Final   Special Requests BOTTLES DRAWN AEROBIC AND ANAEROBIC 5CC EA   Final   Culture  Setup Time     Final   Value: 08/19/2013 04:32     Performed at [facility]   Culture     Final   Value:        BLOOD CULTURE RECEIVED NO GROWTH TO DATE CULTURE WILL BE HELD FOR 5 DAYS BEFORE ISSUING A FINAL NEGATIVE REPORT     Performed at [facility]   Report Status PENDING   Incomplete    Studies/Results: No results found.    Assessment/Plan: [name] [name] is a 78 y.o. male with with CAD sp CABG, AVR for AS sp emergent liofemoropopliteal thromboembolectomy requiring Patch angioplasty of left iliofemoral arterial segment now found to have aesthetic aortic valve endocarditis with possible perivalvular abscess   #1  prosthetic aortic valve endocarditis with perivalvular abscess:  --Continue vancomycin , gentamicin rifampin Cefepime  --follow-up admission blood cultures  --agree with TEE  --CVTS consult  --would consider MRI  brain to ensure he does not have emboli in CNS    If pt truly has a perivalvular abscess and is not an operative candidate then would continue IV abx but also would obtain palliative care consult   #2 PAF: on anticoagulation.   #3 Screening: will check HIV and Hep panel    LOS: 4 days   [name] 08/20/2013, 4:44 PM

## 2013-08-20 NOTE — Progress Notes (Signed)
PULMONARY  / CRITICAL CARE MEDICINE  Name: [name] [name] MRN: [id number] DOB: May 04, 1935    ADMISSION DATE:  09/01/2013 CONSULTATION DATE:  25-Aug-2013   REFERRING MD :  DR. Imogene Burn  PCP - Dhruv Vyas  PRIMARY SERVICE:   CHIEF COMPLAINT:  Hypotension/shock  post op   BRIEF PATIENT DESCRIPTION: 78 yo with CAD, DM-2, AF on xarelto with hemorrhagic shock after emergent Left iliofemoropopliteal thromboembolectomy requiring Patch angioplasty of left iliofemoral arterial segment for acute left leg ischemia . Op EBL ~1500cc, transfused 4 u PRBC and given albumin x 2  Intraoperatively. Started on pressors in OR .  PCCM asked to consult for hypotension [name] 11/15    SIGNIFICANT EVENTS / STUDIES:  11/14 CT angio bifem >embolism left common femoral , left popliteal arteries w/ extension to left peroneal artery as well as right deep femoral artery and post tibial arteries.  11/15 OR for emergent left iliofemoropopliteal thromboembolectomy /patch angioplasty 11/15 NSTEMI post op 11/16 TTE> aortic veg on bioprosthetic valve, mild LVH, normal systolic function; LA severly dilated, RA mild dilated, RVSP  LINES / TUBES: 11/15 R. IJ >>  CULTURES:   ANTIBIOTICS: 11/15 Zinacef post op  11/17 vanc >> 11/17 zosyn >>11/17 11/17 cefepime >> 11/17 rifampin >>  SUBJECTIVE:  Off pressors Mental status improved Eating  VITAL SIGNS: Temp:  [97.3 F (36.3 C)-98.9 F (37.2 C)] 97.3 F (36.3 C) (11/18 0754) Pulse Rate:  [43-108] 82 (11/18 0700) Resp:  [14-25] 20 (11/18 0700) BP: (83-114)/(54-71) 114/66 mmHg (11/18 0700) SpO2:  [95 %-100 %] 98 % (11/18 0700) Weight:  [97.7 kg (215 lb 6.2 oz)] 97.7 kg (215 lb 6.2 oz) (11/18 0600) HEMODYNAMICS: CVP:  [4 mmHg-7 mmHg] 4 mmHg VENTILATOR SETTINGS:   INTAKE / OUTPUT: Intake/Output     11/17 0701 - 11/18 0700 11/18 0701 - 11/19 0700   P.O. 240    I.V. (mL/kg) 2135.5 (21.9)    Other     IV Piggyback 856 250   Total Intake(mL/kg) 3231.5  (33.1) 250 (2.6)   Urine (mL/kg/hr) 1595 (0.7)    Drains     Total Output 1595     Net +1636.5 +250          PHYSICAL EXAMINATION:  Gen: awake and alert HEENT: NCAT, OP clear PULM: CTA B CV: Tachy, irreg irreg AB: BS+, nontender Ext: L toe purple, L groin wound site c/d/i Neuro: confused but easily redirected  LABS:  CBC  Recent Labs Lab 08/18/13 0300 08/19/13 0423 08/20/13 0405  WBC 19.3* 14.0* 10.4  HGB 9.8* 8.7* 8.3*  HCT 27.7* 24.7* 24.0*  PLT 210 199 206   Coag's  Recent Labs Lab 08/18/13 0551  APTT 120*   BMET  Recent Labs Lab 08/18/13 0300 08/19/13 0423 08/20/13 0405  NA 133* 136 134*  K 4.1 3.5 3.3*  CL 101 106 105  CO2 20 19 20   BUN 16 10 8   CREATININE 0.96 0.85 0.67  GLUCOSE 109* 98 93   Electrolytes  Recent Labs Lab 08/18/13 0300 08/19/13 0423 08/20/13 0405  CALCIUM 8.1* 7.7* 7.4*   Sepsis Markers  Recent Labs Lab 08/20/2013 1747 09/01/2013 1830 08/18/13 0300 08/19/13 0423  LATICACIDVEN  --  1.8  --   --   PROCALCITON 0.17  --  0.31 0.30   ABG No results found for this basename: PHART, PCO2ART, PO2ART,  in the last 168 hours Liver Enzymes  Recent Labs Lab 08/17/2013 2216 08/18/13 0300 08/19/13 0423  AST 10 11 23  ALT 5 <5 <5  ALKPHOS 43 48 50  BILITOT 0.9 0.9 0.7  ALBUMIN 2.2* 2.4* 2.0*   Cardiac Enzymes  Recent Labs Lab 08/26/2013 1528 08/18/2013 2217 08/18/13 0300  TROPONINI <0.30 0.38* 0.49*   Glucose  Recent Labs Lab 08/19/13 1155 08/19/13 1539 08/19/13 1933 08/19/13 2347 08/20/13 0447 08/20/13 0752  GLUCAP 109* 113* 119* 114* 91 93    Imaging  11/16 CXR: RIJ in good position, No acute cardiopulmonary disease   ASSESSMENT / PLAN:  PULMONARY A:  Hx of OSA  P:   Titrate O2 for sat >90% Nocturnal CPAP   CARDIOVASCULAR A:  Hypotension /Hypovolemic Shock -resolved Aortic valve bioprosthesis vegetation CAD /Atrial fib on Xarelto prior to admission  PVD w/ critical left limb ischemia s/p  embolectomy/angioplasty presumably related to Aortic valve veg NSTEMI vs demand w/ postive enzymes  11/16 : lactate 1.8, PCT 0.17, cortisol 20 P:  Cont Hep Drip  TEE 11/19 Vascular following     RENAL A:  Renal Insuffiency  hypokalemia P:   Continue IVF until 11/19, then reassess Monitor bmet Replete K Check foley /flush   GASTROINTESTINAL A:  Protein calorie malnutrition P:   Monitor  PPI  advance diet  HEMATOLOGIC A:  Anemia -post op blood loss  S/p 4 u PRBC and 2 albumin during surgery 11/15  Clot and aortic vegetation on Xarelto P:  Monitor cbc closely , transfuse for Hb 8 or lower given CAD No more Xarelto Heparin gtt for now  INFECTIOUS 11/15 PCT low  Aortic valve vegetation A:   P:   Appreciate ID ABX per ID F/u TEE results    ENDOCRINE A:  DM  P:   SSI   NEUROLOGIC A:  Dementia  Delirium P:   Monitor  Minimize sedating meds  Global: PT/OT consult today Family: wife updated by phone by me   TODAY'S SUMMARY: BP, mental status improved, appreciate ID recs, TEE tomorrow; out of SDU today, transfer to [facility], PCCM will sign off 11/19 AM   MCQUAID, DOUGLAS,MD Pulmonary and Critical Care Medicine [facility] Pager: [phone]  08/20/2013, 8:50 AM

## 2013-08-20 NOTE — Progress Notes (Signed)
PHARMACY FOLLOW UP NOTE   Pharmacy Consult for : Lovenox : 2. Vancomycin, Gentamicin Indication: Critical left limb ischemia s/p embolectomy/angioplasty  2. Aortic valve endocarditis with perivalvular abscess  Labs:  Recent Labs  08/18/13 0300 08/18/13 0400 08/18/13 0551 08/18/13 1300 08/19/13 0423 08/20/13 0405  HGB 9.8*  --   --   --  8.7* 8.3*  HCT 27.7*  --   --   --  24.7* 24.0*  PLT 210  --   --   --  199 206  APTT  --   --  120*  --   --   --   HEPARINUNFRC  --  0.88*  --  0.39 0.31 0.41  CREATININE 0.96  --   --   --  0.85 0.67   Lab Results  Component Value Date   INR 0.98 04/02/2013   INR 1.06 11/21/2012   INR 0.93 09/06/2010    Estimated Creatinine Clearance: 93.7 ml/min (by C-G formula based on Cr of 0.67).  Pertinent Medications:  Scheduled:  . antiseptic oral rinse  15 mL Mouth Rinse q12n4p  . atorvastatin  10 mg Oral q1800  . ceFEPime (MAXIPIME) IV  1 g Intravenous Q8H  . chlorhexidine  15 mL Mouth Rinse BID  . enoxaparin (LOVENOX) injection  95 mg Subcutaneous Q12H  . gentamicin  80 mg Intravenous Q8H  . insulin aspart  0-9 Units Subcutaneous Q4H  . rifampin (RIFADIN) IVPB  300 mg Intravenous Q8H  . sodium chloride  3 mL Intravenous Q12H  . vancomycin  1,250 mg Intravenous Q12H   Infusions:  . sodium chloride 75 mL/hr at 08/20/13 2050  . sodium chloride Stopped (08/06/2013 1900)  . sodium chloride     Anti-infectives   Start     Dose/Rate Route Frequency Ordered Stop   08/19/13 1830  ceFEPIme (MAXIPIME) 1 g in dextrose 5 % 50 mL IVPB     1 g 100 mL/hr over 30 Minutes Intravenous Every 8 hours 08/19/13 1726     08/19/13 1700  rifampin (RIFADIN) 300 mg in sodium chloride 0.9 % 100 mL IVPB     300 mg 200 mL/hr over 30 Minutes Intravenous Every 8 hours 08/19/13 1612     08/19/13 1300  gentamicin (GARAMYCIN) 80 mg in dextrose 5 % 50 mL IVPB     75 mg 104 mL/hr over 30 Minutes Intravenous Every 8 hours 08/19/13 1154     08/19/13 1145  cefTRIAXone  (ROCEPHIN) 2 g in dextrose 5 % 50 mL IVPB  Status:  Discontinued     2 g 100 mL/hr over 30 Minutes Intravenous Every 24 hours 08/19/13 1115 08/19/13 1714   08/19/13 0700  vancomycin (VANCOCIN) 1,250 mg in sodium chloride 0.9 % 250 mL IVPB     1,250 mg 166.7 mL/hr over 90 Minutes Intravenous Every 12 hours 08/18/13 1909     08/19/13 0300  piperacillin-tazobactam (ZOSYN) IVPB 3.375 g  Status:  Discontinued     3.375 g 12.5 mL/hr over 240 Minutes Intravenous Every 8 hours 08/18/13 1909 08/19/13 1115   08/18/13 1915  piperacillin-tazobactam (ZOSYN) IVPB 3.375 g     3.375 g 100 mL/hr over 30 Minutes Intravenous  Once 08/18/13 1909 08/18/13 2046   08/18/13 1915  vancomycin (VANCOCIN) 1,500 mg in sodium chloride 0.9 % 500 mL IVPB     1,500 mg 250 mL/hr over 120 Minutes Intravenous  Once 08/18/13 1909 08/18/13 2217   08/10/2013 1900  cefUROXime (ZINACEF) 1.5 g in dextrose 5 %  50 mL IVPB     1.5 g 100 mL/hr over 30 Minutes Intravenous Every 12 hours 08/24/2013 1746 08/18/13 0702   08/24/2013 0857  ceFAZolin (ANCEF) 2-3 GM-% IVPB SOLR    Comments:  Brien Mates   : cabinet override      08/30/2013 0857 08/26/2013 2114     Assessment:  78 y/o male on full dose anticoagulation for critical left limb ischemia s/p embolectomy/angioplasty related to AV veg.  He is also on Gentamicin and Vancomycin, in addition to Rifampin and Cefepime for Aortic valve endocarditis with perivalvular abscess  Patient is afebrile.  WBC's down 19.3 > 14 > 10.4.  Renal function is improving, est CrCl > 90 ml/min  Patient will be placed on Lovenox, full dose, after discontinuation of Heparin infusion.  PM:  Vancomycin trough = 70mcg/ml          Gentamicin trough = (pending) Goal:  Anti-Xa level 0.6-1.2 units/ml 4hrs after LMWH dose given  Gentamicin trough level < 1 mcg/ml, [peaks for synergy 3 - 4 mcg/ml]  Vancomycin trough 15 - 20 mcg/ml.   Plan: 1.  Continue IV Vancomycin as ordered. 2.  Will f/u Gentamicin  trough and re-dose if needed  Nadara Mustard, PharmD., MS Clinical Pharmacist Pager:  [phone] Thank you for allowing pharmacy to be part of this patients care team. 08/20/2013, 10:47 PM

## 2013-08-20 NOTE — Progress Notes (Signed)
3 Days Post-Op Procedure(s) (LRB): THROMBECTOMY FEMORAL ARTERY (Left) ENDARTERECTOMY FEMORAL with patch angioplasty using a 1cm by 6cm Vascuguard bovine patch (Left) INTRA OPERATIVE ARTERIOGRAM (Left) Subjective: Patient examined and 2-D echocardiogram reviewed. Very pleasant 78 year old diabetic status post aVR in 2009 4 aortic stenosis and congestive heart failure. 23 mm pericardial valve was placed. The patient had CABG x4 in 2000 with patent grafts at the time of redo sternotomy and aVR. He was recently hospitalized with a arterial embolus to the left foot an embolectomy was performed. 2-D echocardiogram was reviewed as source of embolus--patient has prior history of atrial fibrillation. He takes Set designer. His aortic valve was noted to have a probable vegetation. No significant AI or AS. He was placed on Empiric Antibiotics and   TEE is planned for tomorrow. Blood cultures negative. Objective: Vital signs in last 24 hours: Temp:  [97.2 F (36.2 C)-98.1 F (36.7 C)] 97.2 F (36.2 C) (11/18 1654) Pulse Rate:  [69-97] 81 (11/18 1900) Cardiac Rhythm:  [-] Normal sinus rhythm;Bundle branch block (11/18 1600) Resp:  [16-25] 19 (11/18 1900) BP: (84-129)/(51-82) 107/63 mmHg (11/18 1900) SpO2:  [96 %-100 %] 100 % (11/18 1900) Weight:  [215 lb 6.2 oz (97.7 kg)] 215 lb 6.2 oz (97.7 kg) (11/18 0600)  Hemodynamic parameters for last 24 hours: CVP:  [4 mmHg-7 mmHg] 4 mmHg  Intake/Output from previous day: 11/17 0701 - 11/18 0700 In: 3231.5 [P.O.:240; I.V.:2135.5; IV Piggyback:856] Out: 1595 [Urine:1595] Intake/Output this shift: Total I/O In: -  Out: 150 [Urine:150]  Irregular heart rhythm Soft flow murmur lower 6 through prosthetic aortic valve Left foot with surgical Kerlix wrap  Lab Results:  Recent Labs  08/19/13 0423 08/20/13 0405  WBC 14.0* 10.4  HGB 8.7* 8.3*  HCT 24.7* 24.0*  PLT 199 206   BMET:  Recent Labs  08/19/13 0423 08/20/13 0405  NA 136 134*  K 3.5 3.3*   CL 106 105  CO2 19 20  GLUCOSE 98 93  BUN 10 8  CREATININE 0.85 0.67  CALCIUM 7.7* 7.4*    PT/INR: No results found for this basename: LABPROT, INR,  in the last 72 hours ABG    Component Value Date/Time   PHART 7.391 09/11/2008 0420   HCO3 23.2 09/11/2008 0420   TCO2 25 09/11/2008 1731   ACIDBASEDEF 1.0 09/11/2008 0420   O2SAT 94.0 09/11/2008 0420   CBG (last 3)   Recent Labs  08/20/13 1210 08/20/13 1627 08/20/13 2041  GLUCAP 96 143* 134*    Assessment/Plan: S/P Procedure(s) (LRB): THROMBECTOMY FEMORAL ARTERY (Left) ENDARTERECTOMY FEMORAL with patch angioplasty using a 1cm by 6cm Vascuguard bovine patch (Left) INTRA OPERATIVE ARTERIOGRAM (Left) Will review results of TEE Agree with plan for long-term IV antibiotics Patient would be poor candidate for third sternotomy   LOS: 4 days    VAN TRIGT III,PETER 08/20/2013

## 2013-08-20 NOTE — Progress Notes (Signed)
Patient refuses to wear cpap tonight. RT will continue to monitor.

## 2013-08-20 NOTE — Progress Notes (Signed)
eLink Nursing ICU Electrolyte Replacement Protocol  Patient Name: [name] [name] DOB: Feb 22, 1935 MRN: [id number]  Date of Service  08/20/2013   HPI/Events of Note    Recent Labs Lab 08/13/2013 0350  08/03/2013 1504 08/26/2013 2216 08/18/13 0300 08/19/13 0423 08/20/13 0405  NA 131*  < > 135 134* 133* 136 134*  K 4.5  < > 4.7 4.3 4.1 3.5 3.3*  CL 97  --   --  104 101 106 105  CO2 22  --   --  23 20 19 20   GLUCOSE 104*  < > 190* 147* 109* 98 93  BUN 24*  --   --  17 16 10 8   CREATININE 1.05  --   --  0.87 0.96 0.85 0.67  CALCIUM 8.9  --   --  7.9* 8.1* 7.7* 7.4*  < > = values in this interval not displayed.  Estimated Creatinine Clearance: 83.7 ml/min (by C-G formula based on Cr of 0.67).  Intake/Output     11/17 0701 - 11/18 0700   P.O. 240   I.V. (mL/kg) 1963.5 (22.2)   IV Piggyback 856   Total Intake(mL/kg) 3059.5 (34.6)   Urine (mL/kg/hr) 1395 (0.7)   Total Output 1395   Net +1664.5        - I/O DETAILED x24h    Total I/O In: 1164 [I.V.:860; IV Piggyback:304] Out: 520 [Urine:520] - I/O THIS SHIFT    ASSESSMENT   eICURN Interventions  K+ 3.3 -+Electrolyte protocol criteria met.  Replace per protocol.  MD notified    ASSESSMENT: MAJOR ELECTROLYTE    Merita Norton 08/20/2013, 6:40 AM

## 2013-08-20 NOTE — Progress Notes (Addendum)
  VASCULAR SURGERY  PROGRESS NOTE   3 Days Post-Op  SUBJECTIVE: Pt doing well this am. More oriented. Off NEO, VSS. No complaints  PHYSICAL EXAM: BP Readings from Last 3 Encounters:  08/20/13 101/58  08/20/13 101/58  08/20/13 101/58   Temp Readings from Last 3 Encounters:  08/20/13 98.1 F (36.7 C) Oral  08/20/13 98.1 F (36.7 C) Oral  08/20/13 98.1 F (36.7 C) Oral   Pulse Readings from Last 3 Encounters:  08/20/13 86  08/20/13 86  08/20/13 86   SpO2 Readings from Last 3 Encounters:  08/20/13 98%  08/20/13 98%  08/20/13 98%     Intake/Output Summary (Last 24 hours) at 08/20/13 0737 Last data filed at 08/20/13 0700  Gross per 24 hour  Intake 3231.5 ml  Output   1395 ml  Net 1836.5 ml    Extremities: Incisions clean, dry and intact Left 5th and great toe are demarcating Both feet warm   Pulse status biphasic PT bilat   LABS: Lab Results  Component Value Date   WBC 10.4 08/20/2013   HGB 8.3* 08/20/2013   HCT 24.0* 08/20/2013   MCV 86.0 08/20/2013   PLT 206 08/20/2013   Lab Results  Component Value Date   CREATININE 0.67 08/20/2013   Lab Results  Component Value Date   INR 0.98 04/02/2013       ASSESSMENT: 3 Days Post-Op   PLAN:  Poss transfer to stepdown  GU: cont foley for strict I/O  Wound Management: dry guaze between toes, zeroform on 5th toe  DVT prophylaxis: IV heparin  Nutrition: Reg diet  Antibiotics: Vanc/gent/cefepime per ID  Poss vegetation on valve - TEE per cardiology  ROCZNIAK,REGINA J 7:41 AM 08/20/2013   Addendum  I have independently interviewed and examined the patient, and I agree with the physician assistant's findings.  Appreciate help from consultants.   Pt AMS is resolved at this point.  Pt off vasopressors at this point. Ok to transfer to 3S.  Pt can be changed from Heparin drip to Lovenox for ease.  TCT c/s for possible valvular abscess.  If BP improves, will start some gentle diuresis  tomorrow.  Leonides Sake, MD [facility] Office: [phone] Pager: [phone]  08/20/2013, 8:22 AM

## 2013-08-21 ENCOUNTER — Encounter (HOSPITAL_COMMUNITY): Payer: Self-pay | Admitting: *Deleted

## 2013-08-21 ENCOUNTER — Encounter (HOSPITAL_COMMUNITY): Admission: EM | Disposition: E | Payer: Self-pay | Source: Home / Self Care | Attending: Internal Medicine

## 2013-08-21 DIAGNOSIS — I743 Embolism and thrombosis of arteries of the lower extremities: Principal | ICD-10-CM

## 2013-08-21 HISTORY — PX: TEE WITHOUT CARDIOVERSION: SHX5443

## 2013-08-21 LAB — GLUCOSE, CAPILLARY
Glucose-Capillary: 108 mg/dL — ABNORMAL HIGH (ref 70–99)
Glucose-Capillary: 115 mg/dL — ABNORMAL HIGH (ref 70–99)

## 2013-08-21 LAB — TYPE AND SCREEN
ABO/RH(D): A POS
Unit division: 0
Unit division: 0
Unit division: 0

## 2013-08-21 LAB — BASIC METABOLIC PANEL
BUN: 6 mg/dL (ref 6–23)
CO2: 20 mEq/L (ref 19–32)
Calcium: 7.2 mg/dL — ABNORMAL LOW (ref 8.4–10.5)
GFR calc Af Amer: >90 mL/min (ref >90)
GFR calc non Af Amer: >90 mL/min (ref >90)
Glucose, Bld: 108 mg/dL — ABNORMAL HIGH (ref 70–99)
Potassium: 3.3 mEq/L — ABNORMAL LOW (ref 3.5–5.1)

## 2013-08-21 LAB — CBC
HCT: 25.2 % — ABNORMAL LOW (ref 39.0–52.0)
MCH: 29.9 pg (ref 26.0–34.0)
MCHC: 34.5 g/dL (ref 30.0–36.0)
RDW: 15.6 % — ABNORMAL HIGH (ref 11.5–15.5)

## 2013-08-21 SURGERY — ECHOCARDIOGRAM, TRANSESOPHAGEAL
Anesthesia: Moderate Sedation

## 2013-08-21 MED ORDER — FENTANYL CITRATE 0.05 MG/ML IJ SOLN
INTRAMUSCULAR | Status: DC | PRN
  Administered 2013-08-21: 25 ug via INTRAVENOUS

## 2013-08-21 MED ORDER — FENTANYL CITRATE 0.05 MG/ML IJ SOLN
INTRAMUSCULAR | Status: AC
  Filled 2013-08-21: qty 4

## 2013-08-21 MED ORDER — MORPHINE SULFATE 2 MG/ML IJ SOLN: 1.0000 mg | INTRAMUSCULAR | Status: DC | PRN

## 2013-08-21 MED ORDER — FUROSEMIDE 10 MG/ML IJ SOLN
20.0000 mg | Freq: Every day | INTRAMUSCULAR | Status: DC
  Administered 2013-08-21: 20 mg via INTRAVENOUS

## 2013-08-21 MED ORDER — BUTAMBEN-TETRACAINE-BENZOCAINE 2-2-14 % EX AERO
INHALATION_SPRAY | CUTANEOUS | Status: DC | PRN
  Administered 2013-08-21: 2 via TOPICAL

## 2013-08-21 MED ORDER — DIPHENHYDRAMINE HCL 50 MG/ML IJ SOLN
INTRAMUSCULAR | Status: AC
  Filled 2013-08-21: qty 1

## 2013-08-21 MED ORDER — MIDAZOLAM HCL 10 MG/2ML IJ SOLN
INTRAMUSCULAR | Status: DC | PRN
  Administered 2013-08-21 (×2): 1 mg via INTRAVENOUS

## 2013-08-21 MED ORDER — INSULIN ASPART 100 UNIT/ML ~~LOC~~ SOLN
0.0000 [IU] | Freq: Three times a day (TID) | SUBCUTANEOUS | Status: DC
  Administered 2013-08-23: 1 [IU] via SUBCUTANEOUS

## 2013-08-21 MED ORDER — METFORMIN HCL 500 MG PO TABS
500.0000 mg | ORAL_TABLET | Freq: Two times a day (BID) | ORAL | Status: DC
  Administered 2013-08-22 – 2013-08-23 (×3): 500 mg via ORAL
  Filled 2013-08-21 (×7): qty 1

## 2013-08-21 MED ORDER — MIDAZOLAM HCL 5 MG/ML IJ SOLN
INTRAMUSCULAR | Status: AC
  Filled 2013-08-21: qty 3

## 2013-08-21 MED ORDER — LORAZEPAM 1 MG PO TABS
1.0000 mg | ORAL_TABLET | Freq: Four times a day (QID) | ORAL | Status: DC | PRN
  Administered 2013-08-21 – 2013-08-23 (×3): 1 mg via ORAL
  Filled 2013-08-21 (×3): qty 1

## 2013-08-21 MED ORDER — POTASSIUM CHLORIDE CRYS ER 20 MEQ PO TBCR
40.0000 meq | EXTENDED_RELEASE_TABLET | Freq: Two times a day (BID) | ORAL | Status: DC
  Administered 2013-08-21 – 2013-08-24 (×7): 40 meq via ORAL
  Filled 2013-08-21 (×12): qty 2

## 2013-08-21 NOTE — Clinical Social Work Placement (Signed)
Clinical Social Work Department CLINICAL SOCIAL WORK PLACEMENT NOTE 08/17/2013  Patient:  [name] [name], [name] [name]  Account Number:  [id number] Admit date:  08/23/2013  Clinical Social Worker:  Cherre Blanc, [place]  Date/time:  08/29/2013 03:15 PM  Clinical Social Work is seeking post-discharge placement for this patient at the following level of care:   SKILLED NURSING   (*CSW will update this form in Epic as items are completed)   08/31/2013  Patient/family provided with [facility] Department of Clinical Social Work's list of facilities offering this level of care within the geographic area requested by the patient (or if unable, by the patient's family).  08/18/2013  Patient/family informed of their freedom to choose among providers that offer the needed level of care, that participate in Medicare, Medicaid or managed care program needed by the patient, have an available bed and are willing to accept the patient.  08/08/2013  Patient/family informed of [facility]' ownership interest in [facility], as well as of the fact that they are under no obligation to receive care at this facility.  PASARR submitted to EDS on 08/26/2013 PASARR number received from EDS on 08/24/2013  FL2 transmitted to all facilities in geographic area requested by pt/family on  08/20/2013 FL2 transmitted to all facilities within larger geographic area on   Patient informed that his/her managed care company has contracts with or will negotiate with  certain facilities, including the following:     Patient/family informed of bed offers received:   Patient chooses bed at  Physician recommends and patient chooses bed at    Patient to be transferred to  on   Patient to be transferred to facility by   The following physician request were entered in Epic:   Additional Comments:   Roddie Mc, [place], [place], [phone]

## 2013-08-21 NOTE — Progress Notes (Signed)
  Echocardiogram Echocardiogram Transesophageal has been performed.  [name] [name] 08/20/2013, 4:27 PM

## 2013-08-21 NOTE — Progress Notes (Signed)
Subjective: No complaints.   Objective: Vital signs in last 24 hours: Temp:  [97.2 F (36.2 C)-98.7 F (37.1 C)] 98.7 F (37.1 C) (11/19 0717) Pulse Rate:  [57-93] 57 (11/19 0717) Resp:  [16-25] 20 (11/19 0717) BP: (86-129)/(57-82) 106/65 mmHg (11/19 0717) SpO2:  [96 %-100 %] 98 % (11/19 0717)    Intake/Output from previous day: 11/18 0701 - 11/19 0700 In: 2465 [P.O.:680; I.V.:933; IV Piggyback:852] Out: 1176 [Urine:1175; Stool:1] Intake/Output this shift:    Medications Current Facility-Administered Medications  Medication Dose Route Frequency Provider Last Rate Last Dose  . 0.9 %  sodium chloride infusion   Intravenous Continuous Fransisco Hertz, MD 75 mL/hr at 08/20/13 2050    . 0.9 %  sodium chloride infusion   Intravenous Continuous Storm Frisk, MD      . 0.9 %  sodium chloride infusion   Intravenous Continuous Brittainy Simmons, PA-C      . acetaminophen (TYLENOL) tablet 325-650 mg  325-650 mg Oral Q4H PRN Fransisco Hertz, MD       Or  . acetaminophen (TYLENOL) suppository 325-650 mg  325-650 mg Rectal Q4H PRN Fransisco Hertz, MD      . alum & mag hydroxide-simeth (MAALOX/MYLANTA) 200-200-20 MG/5ML suspension 15-30 mL  15-30 mL Oral Q2H PRN Fransisco Hertz, MD      . antiseptic oral rinse (BIOTENE) solution 15 mL  15 mL Mouth Rinse q12n4p Oretha Milch, MD   15 mL at 08/20/13 1600  . atorvastatin (LIPITOR) tablet 10 mg  10 mg Oral q1800 Fransisco Hertz, MD   10 mg at 08/20/13 1700  . bisacodyl (DULCOLAX) suppository 10 mg  10 mg Rectal Daily PRN Fransisco Hertz, MD      . ceFEPIme (MAXIPIME) 1 g in dextrose 5 % 50 mL IVPB  1 g Intravenous Q8H Storm Frisk, MD   1 g at 08/20/2013 0925  . chlorhexidine (PERIDEX) 0.12 % solution 15 mL  15 mL Mouth Rinse BID Oretha Milch, MD   15 mL at 08/04/2013 0924  . enoxaparin (LOVENOX) injection 95 mg  95 mg Subcutaneous Q12H Storm Frisk, MD   95 mg at 08/15/2013 1914  . furosemide (LASIX) injection 20 mg  20 mg Intravenous Daily Fransisco Hertz, MD   20 mg at 08/22/2013 0924  . gentamicin (GARAMYCIN) IVPB 60 mg  60 mg Intravenous Q12H Storm Frisk, MD      . guaiFENesin-dextromethorphan [facility] DM) 100-10 MG/5ML syrup 15 mL  15 mL Oral Q4H PRN Fransisco Hertz, MD      . insulin aspart (novoLOG) injection 0-9 Units  0-9 Units Subcutaneous Q4H Hillary Bow, DO   1 Units at 08/20/13 2052  . metFORMIN (GLUCOPHAGE) tablet 500 mg  500 mg Oral BID WC Fransisco Hertz, MD      . ondansetron [facility]) injection 4 mg  4 mg Intravenous Q6H PRN Fransisco Hertz, MD      . oxyCODONE-acetaminophen (PERCOCET/ROXICET) 5-325 MG per tablet 1-2 tablet  1-2 tablet Oral Q4H PRN Fransisco Hertz, MD   2 tablet at 08/26/2013 0513  . phenol (CHLORASEPTIC) mouth spray 1 spray  1 spray Mouth/Throat PRN Fransisco Hertz, MD   1 spray at 08/18/13 1757  . polyvinyl alcohol (LIQUIFILM TEARS) 1.4 % ophthalmic solution 1 drop  1 drop Both Eyes PRN Regina J Roczniak, PA-C      . potassium chloride SA (K-DUR,KLOR-CON) CR tablet 40  mEq  40 mEq Oral BID Fransisco Hertz, MD   40 mEq at Aug 30, 2013 9147  . rifampin (RIFADIN) 300 mg in sodium chloride 0.9 % 100 mL IVPB  300 mg Intravenous Q8H Randall Hiss, MD   300 mg at 2013-08-30 0925  . senna-docusate (Senokot-S) tablet 1 tablet  1 tablet Oral QHS PRN Fransisco Hertz, MD      . sodium chloride 0.9 % injection 3 mL  3 mL Intravenous Q12H Hillary Bow, DO   3 mL at 2013/08/30 0925  . vancomycin (VANCOCIN) 1,250 mg in sodium chloride 0.9 % 250 mL IVPB  1,250 mg Intravenous Q12H Gala Lewandowsky [place], RPH   1,250 mg at Aug 30, 2013 8295    PE: General appearance: alert, cooperative and no distress Lungs: clear to auscultation bilaterally Heart: regular rate and rhythm, 3/6 murmur Extremities: bilateral LEE Pulses: 2+ and symmetric Skin: cool left distal extremity Neurologic: Grossly normal  Lab Results:   Recent Labs  08/19/13 0423 08/20/13 0405 Aug 30, 2013 0500  WBC 14.0* 10.4 11.5*  HGB 8.7* 8.3* 8.7*  HCT 24.7* 24.0* 25.2*    PLT 199 206 217   BMET  Recent Labs  08/19/13 0423 08/20/13 0405 30-Aug-2013 0500  NA 136 134* 132*  K 3.5 3.3* 3.3*  CL 106 105 105  CO2 19 20 20   GLUCOSE 98 93 108*  BUN 10 8 6   CREATININE 0.85 0.67 0.62  CALCIUM 7.7* 7.4* 7.2*     Assessment/Plan    Principal Problem:   Critical lower limb ischemia Active Problems:   H/O aortic valve replacement with porcine valve; 23 mm Edwards Pericardial Magna Ease.-2000   OSA on CPAP   DVT (deep venous thrombosis)   Paroxysmal atrial flutter   Hemorrhagic shock   Acute blood loss anemia   CAD in native artery - s/p CABG in 2000   Prosthetic aortic valve vegetation   Type 2 diabetes mellitus with complications   Chronic anticoagulation   HTN (hypertension)   Dyslipidemia  Plan: Borderline tachycardic, WBC slightly elevated at 11.5. Afebrile. On antibiotics. Plan for TEE today to evaluate for endocarditis. MD to follow.     LOS: 5 days    Brittainy M. Sharol Harness, PA-C 08-30-13 10:01 AM  I have seen and examined the patient along with Brittainy M. Sharol Harness, PA-C.  I have reviewed the chart, notes and new data.  I agree with PA's note.  Key new complaints: sleepy Key examination changes: very frequent atrial and ventricular ectopy Key new findings / data: WBC remains slightly elevated  PLAN: TEE today for suspected prosthetic valve endocarditis with embolic event and perivalvular abscess. This procedure has been fully reviewed with the patient and written informed consent has been obtained.   Thurmon Fair, MD, [facility] [phone] 08/30/13, 1:17 PM

## 2013-08-21 NOTE — Progress Notes (Signed)
Patient transferred to 6N. CSW reported off to the social worker, Roddie Mc, who will follow up with patient as needed. This Child psychotherapist is signing off at this time.   Maree Krabbe, MSW, [facility]  [phone]

## 2013-08-21 NOTE — Progress Notes (Signed)
[facility]   Day # 3 cefepime Day # 3  gentamicin Day # 4 vancomycin  Subjective: No new complaints   Antibiotics:  Anti-infectives   Start     Dose/Rate Route Frequency Ordered Stop   09/13/13 1000  gentamicin (GARAMYCIN) IVPB 60 mg     60 mg 100 mL/hr over 30 Minutes Intravenous Every 12 hours 08/20/13 2334     08/19/13 1830  ceFEPIme (MAXIPIME) 1 g in dextrose 5 % 50 mL IVPB     1 g 100 mL/hr over 30 Minutes Intravenous Every 8 hours 08/19/13 1726     08/19/13 1700  rifampin (RIFADIN) 300 mg in sodium chloride 0.9 % 100 mL IVPB     300 mg 200 mL/hr over 30 Minutes Intravenous Every 8 hours 08/19/13 1612     08/19/13 1300  gentamicin (GARAMYCIN) 80 mg in dextrose 5 % 50 mL IVPB  Status:  Discontinued     75 mg 104 mL/hr over 30 Minutes Intravenous Every 8 hours 08/19/13 1154 08/20/13 2334   08/19/13 1145  cefTRIAXone (ROCEPHIN) 2 g in dextrose 5 % 50 mL IVPB  Status:  Discontinued     2 g 100 mL/hr over 30 Minutes Intravenous Every 24 hours 08/19/13 1115 08/19/13 1714   08/19/13 0700  vancomycin (VANCOCIN) 1,250 mg in sodium chloride 0.9 % 250 mL IVPB     1,250 mg 166.7 mL/hr over 90 Minutes Intravenous Every 12 hours 08/18/13 1909     08/19/13 0300  piperacillin-tazobactam (ZOSYN) IVPB 3.375 g  Status:  Discontinued     3.375 g 12.5 mL/hr over 240 Minutes Intravenous Every 8 hours 08/18/13 1909 08/19/13 1115   08/18/13 1915  piperacillin-tazobactam (ZOSYN) IVPB 3.375 g     3.375 g 100 mL/hr over 30 Minutes Intravenous  Once 08/18/13 1909 08/18/13 2046   08/18/13 1915  vancomycin (VANCOCIN) 1,500 mg in sodium chloride 0.9 % 500 mL IVPB     1,500 mg 250 mL/hr over 120 Minutes Intravenous  Once 08/18/13 1909 08/18/13 2217   08/10/2013 1900  cefUROXime (ZINACEF) 1.5 g in dextrose 5 % 50 mL IVPB     1.5 g 100 mL/hr over 30 Minutes Intravenous Every 12 hours 08/18/2013 1746 08/18/13 0702   08/21/2013 0857  ceFAZolin (ANCEF) 2-3 GM-% IVPB SOLR      Comments:  Brien Mates   : cabinet override      08/19/2013 0857 08/20/2013 2114      Medications: Scheduled Meds: . antiseptic oral rinse  15 mL Mouth Rinse q12n4p  . atorvastatin  10 mg Oral q1800  . ceFEPime (MAXIPIME) IV  1 g Intravenous Q8H  . chlorhexidine  15 mL Mouth Rinse BID  . enoxaparin (LOVENOX) injection  95 mg Subcutaneous Q12H  . furosemide  20 mg Intravenous Daily  . gentamicin  60 mg Intravenous Q12H  . insulin aspart  0-9 Units Subcutaneous Q4H  . metFORMIN  500 mg Oral BID WC  . potassium chloride  40 mEq Oral BID  . rifampin (RIFADIN) IVPB  300 mg Intravenous Q8H  . sodium chloride  3 mL Intravenous Q12H  . vancomycin  1,250 mg Intravenous Q12H   Continuous Infusions: . sodium chloride 75 mL/hr at 08/20/13 2050  . sodium chloride Stopped (08/12/2013 1900)  . sodium chloride     PRN Meds:.acetaminophen, acetaminophen, alum & mag hydroxide-simeth, bisacodyl, guaiFENesin-dextromethorphan, ondansetron, oxyCODONE-acetaminophen, phenol, polyvinyl alcohol, senna-docusate   Objective: Weight change:   Intake/Output Summary (Last  24 hours) at 08/31/2013 0923 Last data filed at 08/09/2013 0401  Gross per 24 hour  Intake   1703 ml  Output   1061 ml  Net    642 ml   Blood pressure 106/65, pulse 57, temperature 98.7 F (37.1 C), temperature source Oral, resp. rate 20, height 5\' 11"  (1.803 m), weight 215 lb 6.2 oz (97.7 kg), SpO2 98.00%. Temp:  [97.2 F (36.2 C)-98.7 F (37.1 C)] 98.7 F (37.1 C) (11/19 0717) Pulse Rate:  [57-97] 57 (11/19 0717) Resp:  [16-25] 20 (11/19 0717) BP: (86-129)/(57-82) 106/65 mmHg (11/19 0717) SpO2:  [96 %-100 %] 98 % (11/19 0717)  Physical Exam: General: Alert and awake, oriented x3, not in any acute distress.  HEENT: anicteric sclera, pupils reactive to light and accommodation, EOMI, oropharynx clear and without exudate, multiple fillings , neck with central line CVSirr, irr rate, normal r, ii/vi systolic murmur RUSB,  Chest:  dminished bs at the bases no wheezing, rales or rhonchi  Abdomen: soft nontender, nondistended, normal bowel sounds,  Extremities: Skin:  Bilateral TKA sites are clean  Left groin site clean, Hands without Oslers or [place]ay lesions. Feet, Left BIG  toe is cyanotic, left 5th toe is increaingly cyanotic now with entire toe purple. Neuro nonfocal  Lab Results:  Recent Labs  08/20/13 0405 08/26/2013 0500  WBC 10.4 11.5*  HGB 8.3* 8.7*  HCT 24.0* 25.2*  PLT 206 217    BMET  Recent Labs  08/20/13 0405 08/27/2013 0500  NA 134* 132*  K 3.3* 3.3*  CL 105 105  CO2 20 20  GLUCOSE 93 108*  BUN 8 6  CREATININE 0.67 0.62  CALCIUM 7.4* 7.2*    Micro Results: Recent Results (from the past 240 hour(s))  CULTURE, BLOOD (ROUTINE X 2)     Status: None   Collection Time    08/18/13  8:00 PM      Result Value Range Status   Specimen Description BLOOD RIGHT ARM   Final   Special Requests BOTTLES DRAWN AEROBIC AND ANAEROBIC 5CC EA   Final   Culture  Setup Time     Final   Value: 08/19/2013 04:33     Performed at [facility]   Culture     Final   Value:        BLOOD CULTURE RECEIVED NO GROWTH TO DATE CULTURE WILL BE HELD FOR 5 DAYS BEFORE ISSUING A FINAL NEGATIVE REPORT     Performed at [facility]   Report Status PENDING   Incomplete  CULTURE, BLOOD (ROUTINE X 2)     Status: None   Collection Time    08/18/13  8:10 PM      Result Value Range Status   Specimen Description BLOOD RIGHT HAND   Final   Special Requests BOTTLES DRAWN AEROBIC AND ANAEROBIC 5CC EA   Final   Culture  Setup Time     Final   Value: 08/19/2013 04:32     Performed at [facility]   Culture     Final   Value:        BLOOD CULTURE RECEIVED NO GROWTH TO DATE CULTURE WILL BE HELD FOR 5 DAYS BEFORE ISSUING A FINAL NEGATIVE REPORT     Performed at [facility]   Report Status PENDING   Incomplete    Studies/Results: No results found.    Assessment/Plan: [name] [name] is a  78 y.o. 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 Donata Clay has seen pt and feels he is a poor operative candidate  --would consider MRI brain to ensure he does not have emboli in CNS    If pt truly has a perivalvular abscess and is not an operative candidate then would continue IV abx but also would obtain palliative care consult , given poor prognosis if he has a perivalvular abscess, would also then consider lifelong antibiotics  #2 PAF: on anticoagulation.   #3 Screening: will check HIV and Hep panel  Dr. Ninetta Lights is covering for me tomorrow.   LOS: 5 days   [name] 08/14/2013, 9:23 AM

## 2013-08-21 NOTE — Progress Notes (Signed)
[facility] Progress Note [place] TEAM 1 - Stepdown/ICU TEAM   EULIS SALAZAR UJW:[id number] DOB: 10/08/1934 DOA: 28-Aug-2013 PCP: Ignatius Specking., MD  Admit HPI / Brief Narrative: 78 yo with CAD, DM-2, AF on xarelto with hemorrhagic shock after emergent Left iliofemoropopliteal thromboembolectomy requiring Patch angioplasty of left iliofemoral arterial segment for acute left leg ischemia . Op EBL ~1500cc, transfused 4 u PRBC and given albumin x 2 Intraoperatively. Started on pressors in OR . PCCM asked to consult for hypotension [name] 11/15   SIGNIFICANT EVENTS / STUDIES:  11/14 CT angio bifem >embolism left common femoral , left popliteal arteries w/ extension to left peroneal artery as well as right deep femoral artery and post tibial arteries.  11/15 OR for emergent left iliofemoropopliteal thromboembolectomy /patch angioplasty  11/15 NSTEMI post op  11/15 R. IJ >> 11/16 TTE> aortic veg on bioprosthetic valve, mild LVH, normal systolic function; LA severly dilated, RA mild dilated, RVSP  Assessment/Plan:  Aortic valve bioprosthesis (2009) with acute vegetation Care as per ID, Cardiology, and TCTS - results of TEE noted - will await further recommendations from consultants - cont empiric abx as per ID   PVD w/ critical left limb ischemia s/p embolectomy/angioplasty presumably related to Aortic valve veg - L 5th toe gangrene  Vasc Surgery is directing care  Hx of OSA  QHS CPAP per home regimen   Hypotension / Hypovolemic Shock BP soft during/after TEE - resume gentle hydration and follow   CAD s/p CABG 2000 - NSTEMI vs demand w/ postive enzymes  As per Cardiology  Atrial fib on Xarelto prior to admission  NSR at time of exam today - full dose lovenox anticoag   Renal Insuffiency  Resolved - keep hydrated   Hypokalemia Replace and follow - check Mg in AM  Protein calorie malnutrition  Anemia -post op blood loss  S/p 4 u PRBC and 2 albumin during surgery  11/15   DM Reasonably well controlled at this time - follow trend   Dementia / Delirium  Code Status: FULL Family Communication: no family present at time of exam Disposition Plan: SDU  Consultants: ID Cardiology PCCM >> [facility] Vasc Surgery    Antibiotics: 11/15 Zinacef post op  11/17 vanc >>  11/17 zosyn >>11/17  11/17 cefepime >>  11/17 rifampin >>  DVT prophylaxis: Full dose lovenox  HPI/Subjective: Pt is seen post TEE.  He is alert and conversant.  BP has improved to SBP of ~100.  He is understandably anxious, but denies sob, n/v, abdom pain, or cp.  Objective: Blood pressure 110/69, pulse 101, temperature 97.5 F (36.4 C), temperature source Oral, resp. rate 18, height 5\' 11"  (1.803 m), weight 97.7 kg (215 lb 6.2 oz), SpO2 96.00%.  Intake/Output Summary (Last 24 hours) at 08/31/2013 1436 Last data filed at 08/09/2013 1400  Gross per 24 hour  Intake   1155 ml  Output   2821 ml  Net  -1666 ml   Exam: General: No acute respiratory distress Lungs: Clear to auscultation bilaterally without wheezes or crackles Cardiovascular: Regular rate without gallop or rub  Abdomen: Nontender, nondistended, soft, bowel sounds positive, no rebound, no ascites, no appreciable mass Extremities: No significant cyanosis, clubbing, or edema bilateral lower extremities with exception to L great toe which is necrotic/black  Data Reviewed: Basic Metabolic Panel:  Recent Labs Lab 08/20/2013 2216 08/18/13 0300 08/19/13 0423 08/20/13 0405 08/16/2013 0500  NA 134* 133* 136 134* 132*  K 4.3 4.1 3.5 3.3* 3.3*  CL 104 101 106 105 105  CO2 23 20 19 20 20   GLUCOSE 147* 109* 98 93 108*  BUN 17 16 10 8 6   CREATININE 0.87 0.96 0.85 0.67 0.62  CALCIUM 7.9* 8.1* 7.7* 7.4* 7.2*   Liver Function Tests:  Recent Labs Lab 08/14/2013 2216 08/18/13 0300 08/19/13 0423  AST 10 11 23   ALT 5 <5 <5  ALKPHOS 43 48 50  BILITOT 0.9 0.9 0.7  PROT 5.0* 5.5* 5.1*  ALBUMIN 2.2* 2.4* 2.0*    CBC:  Recent Labs Lab 08/10/2013 2050 08/18/13 0300 08/19/13 0423 08/20/13 0405 09-12-13 0500  WBC 18.6* 19.3* 14.0* 10.4 11.5*  NEUTROABS 16.2*  --   --   --   --   HGB 9.9* 9.8* 8.7* 8.3* 8.7*  HCT 28.1* 27.7* 24.7* 24.0* 25.2*  MCV 85.2 85.0 85.2 86.0 86.6  PLT 174 210 199 206 217   Cardiac Enzymes:  Recent Labs Lab 08/22/2013 1528 09/01/2013 2217 08/18/13 0300  CKTOTAL 14 19 22   CKMB 2.1 3.1 3.5  TROPONINI <0.30 0.38* 0.49*   BNP (last 3 results) No results found for this basename: PROBNP,  in the last 8760 hours CBG:  Recent Labs Lab 08/20/13 1627 08/20/13 2041 08/20/13 2319 12-Sep-2013 0333 09/12/2013 0742  GLUCAP 143* 134* 115* 112* 108*    Recent Results (from the past 240 hour(s))  CULTURE, BLOOD (ROUTINE X 2)     Status: None   Collection Time    08/18/13  8:00 PM      Result Value Range Status   Specimen Description BLOOD RIGHT ARM   Final   Special Requests BOTTLES DRAWN AEROBIC AND ANAEROBIC 5CC EA   Final   Culture  Setup Time     Final   Value: 08/19/2013 04:33     Performed at [facility]   Culture     Final   Value:        BLOOD CULTURE RECEIVED NO GROWTH TO DATE CULTURE WILL BE HELD FOR 5 DAYS BEFORE ISSUING A FINAL NEGATIVE REPORT     Performed at [facility]   Report Status PENDING   Incomplete  CULTURE, BLOOD (ROUTINE X 2)     Status: None   Collection Time    08/18/13  8:10 PM      Result Value Range Status   Specimen Description BLOOD RIGHT HAND   Final   Special Requests BOTTLES DRAWN AEROBIC AND ANAEROBIC 5CC EA   Final   Culture  Setup Time     Final   Value: 08/19/2013 04:32     Performed at [facility]   Culture     Final   Value:        BLOOD CULTURE RECEIVED NO GROWTH TO DATE CULTURE WILL BE HELD FOR 5 DAYS BEFORE ISSUING A FINAL NEGATIVE REPORT     Performed at [facility]   Report Status PENDING   Incomplete     Studies:  Recent x-ray studies have been reviewed in detail by the  Attending Physician  Scheduled Meds:  Scheduled Meds: . antiseptic oral rinse  15 mL Mouth Rinse q12n4p  . atorvastatin  10 mg Oral q1800  . ceFEPime (MAXIPIME) IV  1 g Intravenous Q8H  . chlorhexidine  15 mL Mouth Rinse BID  . enoxaparin (LOVENOX) injection  95 mg Subcutaneous Q12H  . furosemide  20 mg Intravenous Daily  . gentamicin  60 mg Intravenous Q12H  . insulin aspart  0-9  Units Subcutaneous Q4H  . metFORMIN  500 mg Oral BID WC  . potassium chloride  40 mEq Oral BID  . rifampin (RIFADIN) IVPB  300 mg Intravenous Q8H  . sodium chloride  3 mL Intravenous Q12H  . vancomycin  1,250 mg Intravenous Q12H    Time spent on care of this patient: 35 mins   [facility] T  [facility] Office  [phone] Pager - Text Page per Loretha Stapler as per below:  On-Call/Text Page:      [email or web address]      password [email or web address]  If 7PM-7AM, please contact night-coverage [email or web address] Password [email or web address] 09-14-13, 2:36 PM   LOS: 5 days

## 2013-08-21 NOTE — CV Procedure (Signed)
INDICATIONS: bioprosthetic valve infective endocarditis  PROCEDURE:   Informed consent was obtained prior to the procedure. The risks, benefits and alternatives for the procedure were discussed and the patient comprehended these risks.  Risks include, but are not limited to, cough, sore throat, vomiting, nausea, somnolence, esophageal and stomach trauma or perforation, bleeding, low blood pressure, aspiration, pneumonia, infection, trauma to the teeth and death.    After a procedural time-out, the oropharynx was anesthetized with 20% benzocaine spray. The patient was given 2 mg versed and 25 mcg fentanyl for moderate sedation.   The transesophageal probe was inserted in the esophagus and stomach without difficulty and multiple views were obtained.  The patient was kept under observation until the patient left the procedure room.  The patient left the procedure room in stable condition.   Agitated microbubble saline contrast was not administered.  COMPLICATIONS:   There were no immediate complications.  FINDINGS:  Large, 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 Large, highly mobile vegetation on the aortic prosthesis cusps The valve is moderately stenotic, but not insufficient. Other heart valves appear normal. LVEF appears mildly depressed, 45% or so. Moderate atherosclerosis of the aortic arch and descending aorta.    RECOMMENDATIONS:   Without surgical removal of the prosthetic valve, prognosis is grim and palliative care is indicated. Lifelong antibiotics may provide some benefit. He is at risk of developing severe acute AI or rupture of the sinus of Valsalva into the left atrium or AV block due to abscess extension.  Time Spent Directly with the Patient:  30 minutes   [name] [name] 08/16/2013, 3:22 PM

## 2013-08-21 NOTE — Progress Notes (Signed)
[facility] [place]   Daily Progress Note  Assessment/Planning: POD #4 s/p L iliofemoral TE, L iliofem EA with BPA  Problem list: 1. Shock: resolved, either septic or hemorrhagic etio 2. Possible infectious endocarditis: TEE today, unlikely surgical candidate per TCTS, appreciate ID's input 3. Acute embolism: on Lovenox, L 5th toe gangrene has progressed over the last two days due to embolization from tibial vessel or new emboli 4. Acute blood loss from surgery: no active bleeding 5. Hemodilution from fluid overload: start diuresis today, target -1 L 6. Prior DVT: treated with Lovenox  7. Altered mental status: improved, consider MRI brain per ID 8. Elevated troponin I: already on anticoagulation, no indication for cardiac intervention at this time 9. DM: acceptable control, restart metformin   Subjective  - 4 Days Post-Op  Feels better, some "stinging in left foot"  Objective Filed Vitals:   08/20/13 1900 08/20/13 2350 08/07/2013 0400 08/31/2013 0717  BP: 107/63 122/73 100/74 106/65  Pulse: 81 85 93 57  Temp:  97.8 F (36.6 C) 98.5 F (36.9 C)   TempSrc:  Oral Oral   Resp: 19 23 23 20   Height:      Weight:      SpO2: 100% 100% 100% 98%    Intake/Output Summary (Last 24 hours) at 08/20/2013 0827 Last data filed at 08/26/2013 0401  Gross per 24 hour  Intake   2129 ml  Output   1101 ml  Net   1028 ml    PULM  CTAB CV  RRR GI  soft, NTND VASC  L groin c/d/i, no evidence of hematoma, drain site sealed, warm L foot, L 1st and 5th toe gangrene  Laboratory CBC    Component Value Date/Time   WBC 11.5* 08/13/2013 0500   HGB 8.7* 08/29/2013 0500   HCT 25.2* 08/17/2013 0500   PLT 217 08/24/2013 0500    BMET    Component Value Date/Time   NA 132* 08/26/2013 0500   K 3.3* 08/04/2013 0500   CL 105 09/01/2013 0500   CO2 20 08/20/2013 0500   GLUCOSE 108* 08/22/2013 0500   BUN 6 08/04/2013 0500   CREATININE 0.62 08/14/2013 0500   CREATININE 0.69  05/24/2013 1035   CALCIUM 7.2* 08/16/2013 0500   GFRNONAA >90 08/03/2013 0500   GFRAA >90 08/24/2013 0500    Leonides Sake, MD [facility] [place] Office: [phone] Pager: [phone]  08/27/2013, 8:27 AM

## 2013-08-21 NOTE — Progress Notes (Signed)
PT Cancellation Note  Patient Details Name: [name] [name] MRN: [id number] DOB: Sep 23, 1935   Cancelled Treatment:    Reason Eval/Treat Not Completed: Medical issues which prohibited therapy. RN reports pt currently tachycardia due to anxiety re: TEE. Asked PT to hold until after TEE. PT to return as able.   Marcene Brawn 08/27/2013, 2:06 PM

## 2013-08-21 NOTE — Clinical Social Work Psychosocial (Signed)
Clinical Social Work Department BRIEF PSYCHOSOCIAL ASSESSMENT Sep 05, 2013  Patient:  MANOLO, BOSKET     Account Number:  [id number]     Admit date:  08/26/2013  Clinical Social Worker:  Lavell Luster  Date/Time:  05-Sep-2013 03:00 PM  Referred by:  Physician  Date Referred:  05-Sep-2013 Referred for  SNF Placement   Other Referral:   Interview type:  Family Other interview type:   Patient in endoscopy for procedure at time of assessment so wife was interviewed.    PSYCHOSOCIAL DATA Living Status:  WIFE Admitted from facility:   Level of care:   Primary support name:  Mick Tanguma [phone]   [phone] Primary support relationship to patient:  SPOUSE Degree of support available:   Support is strong.    CURRENT CONCERNS Current Concerns  Post-Acute Placement   Other Concerns:    SOCIAL WORK ASSESSMENT / PLAN CSW spoke with patient's wife about recommendation for SNF Placement and the SNF placement process. Patient has had past SNF stay with [facility] and this would be wife's first preference. Patient is admitted from home with wife Lynden Ang. Wife stated that she knows that patient needs SNF placement, but is unsure of whether or not patient will actually go. Wife is agreeable to looking at what options are available for the patient.   Assessment/plan status:  Psychosocial Support/Ongoing Assessment of Needs Other assessment/ plan:   Complete Fl2, Fax   Information/referral to community resources:   CSW contact information and SNF list left with wife.    PATIENT'S/FAMILY'S RESPONSE TO PLAN OF CARE: Patient's wife is agreeable to SNF search and placement. Patient's wife was pleasant and appreciative of CSW contact. Patient and wife await bed offers.     Roddie Mc, [place], [place], [phone]

## 2013-08-21 NOTE — Progress Notes (Signed)
Pateint's blood pressure 67/35 and 59/45 during TEE; all other VS within normal limits. Patient received 350 ml NS bolus per MD verbal order. Procedure was completed and patient's blood pressure 83/35 at procedure end. MD aware and requested that BP be monitored closely. In recovery area, patient's blood pressure maintained with final blood pressure of 96/57. Pt arousable and oriented; all other VS within normal limits. Patient's floor nurse made aware and will continue to monitor. [place], Rosanna Randy

## 2013-08-21 NOTE — Progress Notes (Signed)
OT Cancellation Note  Patient Details Name: [name] [name] MRN: [id number] DOB: 07-19-1935   Cancelled Treatment:    Reason Eval/Treat Not Completed: Patient at procedure or test/ unavailable. Pt gone for TEE, will re attempt tomorrow  Galen Manila 08/13/2013, 2:13 PM

## 2013-08-22 ENCOUNTER — Encounter (HOSPITAL_COMMUNITY): Payer: Self-pay | Admitting: Cardiovascular Disease

## 2013-08-22 DIAGNOSIS — E118 Type 2 diabetes mellitus with unspecified complications: Secondary | ICD-10-CM

## 2013-08-22 DIAGNOSIS — L02419 Cutaneous abscess of limb, unspecified: Secondary | ICD-10-CM

## 2013-08-22 DIAGNOSIS — I96 Gangrene, not elsewhere classified: Secondary | ICD-10-CM

## 2013-08-22 DIAGNOSIS — I330 Acute and subacute infective endocarditis: Secondary | ICD-10-CM

## 2013-08-22 LAB — CBC
HCT: 27 % — ABNORMAL LOW (ref 39.0–52.0)
Hemoglobin: 9.3 g/dL — ABNORMAL LOW (ref 13.0–17.0)
MCH: 29.7 pg (ref 26.0–34.0)
MCHC: 34.4 g/dL (ref 30.0–36.0)
MCV: 86.3 fL (ref 78.0–100.0)
Platelets: 258 K/uL (ref 150–400)
RBC: 3.13 MIL/uL — ABNORMAL LOW (ref 4.22–5.81)
RDW: 15.4 % (ref 11.5–15.5)
WBC: 14.9 K/uL — ABNORMAL HIGH (ref 4.0–10.5)

## 2013-08-22 LAB — GLUCOSE, CAPILLARY
Glucose-Capillary: 145 mg/dL — ABNORMAL HIGH (ref 70–99)
Glucose-Capillary: 117 mg/dL — ABNORMAL HIGH (ref 70–99)
Glucose-Capillary: 107 mg/dL — ABNORMAL HIGH (ref 70–99)
Glucose-Capillary: 100 mg/dL — ABNORMAL HIGH (ref 70–99)

## 2013-08-22 LAB — BASIC METABOLIC PANEL
CO2: 19 mEq/L (ref 19–32)
GFR calc Af Amer: >90 mL/min (ref >90)
GFR calc non Af Amer: 90 mL/min — ABNORMAL LOW (ref >90)
Glucose, Bld: 114 mg/dL — ABNORMAL HIGH (ref 70–99)
Potassium: 3.7 mEq/L (ref 3.5–5.1)
Sodium: 133 mEq/L — ABNORMAL LOW (ref 135–145)

## 2013-08-22 LAB — MAGNESIUM: Magnesium: 1.3 mg/dL — ABNORMAL LOW (ref 1.5–2.5)

## 2013-08-22 MED ORDER — PROMETHAZINE HCL 25 MG/ML IJ SOLN
12.5000 mg | INTRAMUSCULAR | Status: DC | PRN
  Administered 2013-08-22: 12.5 mg via INTRAVENOUS
  Filled 2013-08-22: qty 1

## 2013-08-22 MED ORDER — ONDANSETRON HCL 4 MG/2ML IJ SOLN
4.0000 mg | INTRAMUSCULAR | Status: DC | PRN
  Administered 2013-08-23: 4 mg via INTRAVENOUS
  Filled 2013-08-22: qty 2

## 2013-08-22 MED ORDER — MAGNESIUM SULFATE 40 MG/ML IJ SOLN
2.0000 g | Freq: Once | INTRAMUSCULAR | Status: AC
  Administered 2013-08-22: 2 g via INTRAVENOUS
  Filled 2013-08-22: qty 50

## 2013-08-22 NOTE — Progress Notes (Addendum)
   Daily Progress Note  Assessment/Planning: POD #5 s/p L iliofem TE, L iliofem EA with BPA; perivalvular aortic valve abscess, prosthetic AV vegetation, thromboembolism to BLE, L 1st and 5th toe dry gangrene   TEE results reviewed  Awaiting CT surgery input   If no surgical options, palliative care consult given the grim prognosis  Given the global prognosis, I'm not certain that there is any advantage to amputation of the L great toe and 5th toe at this time.  We briefly discussed the amputation issue and patient is going to discuss things with his wife  From a vascular surgical viewpoint, I don't have much more to offer other than the toe amputations  Subjective  - 1 Day Post-Op  No complaints, pain controlled  Objective Filed Vitals:   08/22/13 0000 08/22/13 0430 08/22/13 0753 08/22/13 0900  BP: 98/70 97/81 86/57  99/46  Pulse: 118 117 110 111  Temp: 98.8 F (37.1 C) 98.8 F (37.1 C) 98.8 F (37.1 C)   TempSrc: Oral Oral Oral   Resp: 29 28 29 28   Height:      Weight:      SpO2: 100% 99% 99% 100%    Intake/Output Summary (Last 24 hours) at 08/22/13 1037 Last data filed at 08/22/13 0757  Gross per 24 hour  Intake    140 ml  Output   2725 ml  Net  -2585 ml    PULM  CTAB CV  RRR GI  soft, NTND VASC  L groin c/d/i, staples intact, no drainage, L foot warm and edematous 1+, L 1st and 5th toe with dry gangrene at this time without obvious cellulitis  Laboratory CBC    Component Value Date/Time   WBC 14.9* 08/22/2013 0407   HGB 9.3* 08/22/2013 0407   HCT 27.0* 08/22/2013 0407   PLT 258 08/22/2013 0407    BMET    Component Value Date/Time   NA 133* 08/22/2013 0407   K 3.7 08/22/2013 0407   CL 104 08/22/2013 0407   CO2 19 08/22/2013 0407   GLUCOSE 114* 08/22/2013 0407   BUN 5* 08/22/2013 0407   CREATININE 0.69 08/22/2013 0407   CREATININE 0.69 05/24/2013 1035   CALCIUM 7.3* 08/22/2013 0407   GFRNONAA 90* 08/22/2013 0407   GFRAA >90 08/22/2013 0407     Leonides Sake, MD [facility] [place] Office: [phone] Pager: [phone]  08/22/2013, 10:37 AM

## 2013-08-22 NOTE — Progress Notes (Signed)
Physical Therapy Treatment Patient Details Name: [name] [name] MRN: [id number] DOB: [birth year] Today's Date: 08/22/2013 Time: 1308-6578 PT Time Calculation (min): 29 min  PT Assessment / Plan / Recommendation  History of Present Illness 78 yo with CAD, DM-2, AF on xarelto with hemorrhagic shock after emergent Left iliofemoropopliteal thromboembolectomy requiring Patch angioplasty of left iliofemoral arterial segment for acute left leg ischemia. Pt with post-op NSTEMI.   PT Comments   Patient has very flat affect, as he is taking in all his new medical news and implications.  Patient is willing to participate in therapy today to increase his function.  Patient is still limited by the deficits below and will benefit from skilled intervention at a SNF rehab facility.    Follow Up Recommendations  SNF           Equipment Recommendations  None recommended by PT       Frequency Min 3X/week   Progress towards PT Goals Progress towards PT goals: Progressing toward goals  Plan Current plan remains appropriate    Precautions / Restrictions Precautions Precautions: Fall Restrictions Weight Bearing Restrictions: No   Pertinent Vitals/Pain Abdominal pain and leg pain.  RN notified and addressed.    Mobility  Bed Mobility Bed Mobility: Rolling Left Rolling Left: 3: Mod assist Left Sidelying to Sit: 3: Mod assist Details for Bed Mobility Assistance: assist trunk once he rolls to get him to upright sitting Transfers Transfers: Stand Pivot Transfers Sit to Stand: 1: +2 Total assist Sit to Stand: Patient Percentage: 60% Stand to Sit: 1: +2 Total assist Stand to Sit: Patient Percentage: 60% Stand Pivot Transfers: 1: +2 Total assist Stand Pivot Transfers: Patient Percentage: 60% Details for Transfer Assistance: left in lift pad for nursing to return to chair.  Needs assistance with trunk control to stand, and also with small steps for pivot transfer Ambulation/Gait Ambulation/Gait  Assistance: Not tested (comment)    Exercises General Exercises - Lower Extremity Hip Flexion/Marching: 5 reps;AROM;Both     PT Goals (current goals can now be found in the care plan section) Acute Rehab PT Goals PT Goal Formulation: With patient Time For Goal Achievement: 08/26/13 Potential to Achieve Goals: Fair  Visit Information  Last PT Received On: 08/22/13 History of Present Illness: 78 yo with CAD, DM-2, AF on xarelto with hemorrhagic shock after emergent Left iliofemoropopliteal thromboembolectomy requiring Patch angioplasty of left iliofemoral arterial segment for acute left leg ischemia. Pt with post-op NSTEMI.       Cognition  Cognition Arousal/Alertness: Lethargic Behavior During Therapy: Flat affect Overall Cognitive Status: Difficult to assess General Comments: Patient very lethargic with flat affect.  Is able to follow requests with increased time       End of Session PT - End of Session Equipment Utilized During Treatment: Gait belt Activity Tolerance: Patient limited by fatigue;Patient limited by lethargy Patient left: in chair;with call bell/phone within reach Nurse Communication: Mobility status;Need for lift equipment   GP    [name] [name], [place] Pager:  [phone]  [name] [name] 08/22/2013, 12:56 PM

## 2013-08-22 NOTE — Progress Notes (Signed)
Subjective: Feels bad today.   Objective: Vital signs in last 24 hours: Temp:  [97.7 F (36.5 C)-98.9 F (37.2 C)] 97.7 F (36.5 C) (11/20 1550) Pulse Rate:  [90-118] 109 (11/20 1142) Resp:  [18-36] 30 (11/20 1142) BP: (86-111)/(46-82) 103/77 mmHg (11/20 1142) SpO2:  [92 %-100 %] 100 % (11/20 1142)    Intake/Output from previous day: 11/19 0701 - 11/20 0700 In: 200 [I.V.:200] Out: 2575 [Urine:2575] Intake/Output this shift: Total I/O In: -  Out: 150 [Urine:150]  Medications Current Facility-Administered Medications  Medication Dose Route Frequency Provider Last Rate Last Dose  . 0.9 %  sodium chloride infusion   Intravenous Continuous Lonia Blood, MD 75 mL/hr at 08/22/13 1400    . acetaminophen (TYLENOL) tablet 325-650 mg  325-650 mg Oral Q4H PRN Fransisco Hertz, MD       Or  . acetaminophen (TYLENOL) suppository 325-650 mg  325-650 mg Rectal Q4H PRN Fransisco Hertz, MD      . alum & mag hydroxide-simeth (MAALOX/MYLANTA) 200-200-20 MG/5ML suspension 15-30 mL  15-30 mL Oral Q2H PRN Fransisco Hertz, MD      . antiseptic oral rinse (BIOTENE) solution 15 mL  15 mL Mouth Rinse q12n4p Oretha Milch, MD   15 mL at 08/27/2013 1600  . atorvastatin (LIPITOR) tablet 10 mg  10 mg Oral q1800 Fransisco Hertz, MD   10 mg at 08/24/2013 1715  . bisacodyl (DULCOLAX) suppository 10 mg  10 mg Rectal Daily PRN Fransisco Hertz, MD   10 mg at 08/22/13 1617  . ceFEPIme (MAXIPIME) 1 g in dextrose 5 % 50 mL IVPB  1 g Intravenous Q8H Storm Frisk, MD   1 g at 08/22/13 1124  . chlorhexidine (PERIDEX) 0.12 % solution 15 mL  15 mL Mouth Rinse BID Oretha Milch, MD   15 mL at 08/22/13 0903  . enoxaparin (LOVENOX) injection 95 mg  95 mg Subcutaneous Q12H Storm Frisk, MD   95 mg at 08/22/13 1027  . gentamicin (GARAMYCIN) IVPB 60 mg  60 mg Intravenous Q12H Storm Frisk, MD   60 mg at 08/22/13 1028  . guaiFENesin-dextromethorphan (ROBITUSSIN DM) 100-10 MG/5ML syrup 15 mL  15 mL Oral Q4H PRN Fransisco Hertz,  MD      . insulin aspart (novoLOG) injection 0-9 Units  0-9 Units Subcutaneous TID WC Lonia Blood, MD      . LORazepam (ATIVAN) tablet 1 mg  1 mg Oral Q6H PRN Lonia Blood, MD   1 mg at 08/23/2013 2207  . metFORMIN (GLUCOPHAGE) tablet 500 mg  500 mg Oral BID WC Fransisco Hertz, MD   500 mg at 08/22/13 0903  . morphine 2 MG/ML injection 1-2 mg  1-2 mg Intravenous Q3H PRN Lonia Blood, MD      . ondansetron [facility]) injection 4 mg  4 mg Intravenous Q4H PRN Calvert Cantor, MD      . oxyCODONE-acetaminophen (PERCOCET/ROXICET) 5-325 MG per tablet 1-2 tablet  1-2 tablet Oral Q4H PRN Fransisco Hertz, MD   2 tablet at 08/29/2013 1106  . phenol (CHLORASEPTIC) mouth spray 1 spray  1 spray Mouth/Throat PRN Fransisco Hertz, MD   1 spray at 08/18/13 1757  . polyvinyl alcohol (LIQUIFILM TEARS) 1.4 % ophthalmic solution 1 drop  1 drop Both Eyes PRN Marlowe Shores, PA-C   1 drop at 08/22/13 1617  . potassium chloride SA (K-DUR,KLOR-CON) CR tablet 40 mEq  40 mEq  Oral BID Fransisco Hertz, MD   40 mEq at 08/22/13 1131  . promethazine (PHENERGAN) injection 12.5 mg  12.5 mg Intravenous Q4H PRN Calvert Cantor, MD   12.5 mg at 08/22/13 1447  . rifampin (RIFADIN) 300 mg in sodium chloride 0.9 % 100 mL IVPB  300 mg Intravenous Q8H Randall Hiss, MD   300 mg at 08/22/13 1028  . senna-docusate (Senokot-S) tablet 1 tablet  1 tablet Oral QHS PRN Fransisco Hertz, MD      . sodium chloride 0.9 % injection 3 mL  3 mL Intravenous Q12H Hillary Bow, DO   3 mL at 08/10/2013 0925  . vancomycin (VANCOCIN) 1,250 mg in sodium chloride 0.9 % 250 mL IVPB  1,250 mg Intravenous Q12H Gala Lewandowsky [place], RPH   1,250 mg at 08/22/13 1610    PE: General appearance: alert, cooperative and no distress Lungs: clear to auscultation bilaterally Heart: tachy, 3/6 murmur Extremities: no LEE Pulses: 2+ and symmetric Skin: warm and dry Neurologic: Grossly normal  Lab Results:   Recent Labs  08/20/13 0405 08/29/2013 0500 08/22/13 0407    WBC 10.4 11.5* 14.9*  HGB 8.3* 8.7* 9.3*  HCT 24.0* 25.2* 27.0*  PLT 206 217 258   BMET  Recent Labs  08/20/13 0405 08/28/2013 0500 08/22/13 0407  NA 134* 132* 133*  K 3.3* 3.3* 3.7  CL 105 105 104  CO2 20 20 19   GLUCOSE 93 108* 114*  BUN 8 6 5*  CREATININE 0.67 0.62 0.69  CALCIUM 7.4* 7.2* 7.3*    Studies/Results:  TEE 08/07/2013   FINDINGS:  Large, 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  Large, highly mobile vegetation on the aortic prosthesis cusps  The valve is moderately stenotic, but not insufficient.  Other heart valves appear normal.  LVEF appears mildly depressed, 45% or so.  Moderate atherosclerosis of the aortic arch and descending aorta.   Assessment/Plan  Principal Problem:   Critical lower limb ischemia Active Problems:   H/O aortic valve replacement with porcine valve; 23 mm Edwards Pericardial Magna Ease.-2000   OSA on CPAP   DVT (deep venous thrombosis)   Paroxysmal atrial flutter   Hemorrhagic shock   Acute blood loss anemia   CAD in native artery - s/p CABG in 2000   Prosthetic aortic valve vegetation   Type 2 diabetes mellitus with complications   Chronic anticoagulation   HTN (hypertension)   Dyslipidemia  Plan: TEE yesterday demonstrated a large, near circumferential peroprosthetic abscess and large, highly mobile vegetation on the aortic prosthesis cusps. IV antibiotics managed by ID. Dr. Erin Hearing recs are as follows:  RECOMMENDATIONS:  Without surgical removal of the prosthetic valve, prognosis is grim and palliative care is indicated.  Lifelong antibiotics may provide some benefit.  He is at risk of developing severe acute AI or rupture of the sinus of Valsalva into the left atrium or AV block due to abscess extension.      LOS: 6 days    Brittainy M. Sharol Harness, PA-C 08/22/2013 4:18 PM  Very fatigued & lethargic after short work with PT.  I reviewed theTEE findings yesterday with Dr. Royann Shivers -- we discussed the dire  nature of this finding. Seen by DR. Donata Clay who, justifiedly feels that the patient is too high risk for a 3rd sternotomy.  Also with significant perivalvular abscess, there is limited tissue for sewing a new graft.    I fully agree that given the circumstances, his prognosis is very poor - "grim" Palliative  Care is completely reasonable as he is high risk for recurrent embolic phenomena as well as Severe Acute AI, Sinus of Valsalva rupture into LA, or complete Heart Block / Sinus arrest. Continuation of lifelong Abx may potentially, "sterilize" the abscess & vegetation, but the extent of destruction still leaves him susceptable to the same outcome.  We will be happy to help with family meetings if requested, but Dr. Erin Hearing TEE note spells this out clearly.  Will sign off.     Marykay Lex, M.D., M.S. [facility] [address] [place], [place]  [address]  [phone] Pager # [phone] 08/22/2013 5:03 PM

## 2013-08-22 NOTE — Progress Notes (Signed)
Utilization review completed.  

## 2013-08-22 NOTE — Progress Notes (Signed)
1 Day Post-Op Procedure(s) (LRB): TRANSESOPHAGEAL ECHOCARDIOGRAM (TEE) (N/A) Subjective: TEE reviewed Vegetations noted on AVR- no AI present Concern about annular involvement noted  Objective: Vital signs in last 24 hours: Temp:  [97.7 F (36.5 C)-98.8 F (37.1 C)] 98.7 F (37.1 C) (11/20 1902) Pulse Rate:  [109-118] 109 (11/20 1142) Cardiac Rhythm:  [-] Sinus tachycardia;Bundle branch block (11/20 0753) Resp:  [28-30] 28 (11/20 1902) BP: (86-103)/(46-81) 96/70 mmHg (11/20 1902) SpO2:  [99 %-100 %] 100 % (11/20 1902)  Hemodynamic parameters for last 24 hours:  nsr  Intake/Output from previous day: 11/19 0701 - 11/20 0700 In: 200 [I.V.:200] Out: 2575 [Urine:2575] Intake/Output this shift: Total I/O In: -  Out: 200 [Urine:200]  Soft systolic flow murmur  Lab Results:  Recent Labs  09-12-13 0500 08/22/13 0407  WBC 11.5* 14.9*  HGB 8.7* 9.3*  HCT 25.2* 27.0*  PLT 217 258   BMET:  Recent Labs  12-Sep-2013 0500 08/22/13 0407  NA 132* 133*  K 3.3* 3.7  CL 105 104  CO2 20 19  GLUCOSE 108* 114*  BUN 6 5*  CREATININE 0.62 0.69  CALCIUM 7.2* 7.3*    PT/INR: No results found for this basename: LABPROT, INR,  in the last 72 hours ABG    Component Value Date/Time   PHART 7.391 09/11/2008 0420   HCO3 23.2 09/11/2008 0420   TCO2 25 09/11/2008 1731   ACIDBASEDEF 1.0 09/11/2008 0420   O2SAT 94.0 09/11/2008 0420   CBG (last 3)   Recent Labs  08/22/13 1145 08/22/13 1655 08/22/13 2123  GLUCAP 120* 110* 100*    Assessment/Plan: S/P Procedure(s) (LRB): TRANSESOPHAGEAL ECHOCARDIOGRAM (TEE) (N/A) Patient would not benefit from 3rd redo heart operation and would not survive- cont IV antibiotics Situatuon d/w patient who understands   LOS: 6 days    VAN TRIGT [name] [name] 08/22/2013

## 2013-08-22 NOTE — Progress Notes (Signed)
Pt transferred from the 2300, admitted to Rm/3s07. Pt comes from home with spouse. He is alert and oriented but very anxious and emotional/tearful about test tomorrow. Moderate assist with movements. Stage 2 noted to sacrum with foam dressing in place. Placed on telemetry. Oriented to room, instructed to call for assistance before getting out of bed. Pt aware he will be NPO after midnight. Resting comfortably at this time on RA, will continue to monitor

## 2013-08-22 NOTE — Progress Notes (Signed)
INFECTIOUS DISEASE PROGRESS NOTE  ID: [name] [name] is a 78 y.o. 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   Abtx:  Anti-infectives   Start     Dose/Rate Route Frequency Ordered Stop   08/12/2013 1000  gentamicin (GARAMYCIN) IVPB 60 mg     60 mg 100 mL/hr over 30 Minutes Intravenous Every 12 hours 08/20/13 2334     08/19/13 1830  ceFEPIme (MAXIPIME) 1 g in dextrose 5 % 50 mL IVPB     1 g 100 mL/hr over 30 Minutes Intravenous Every 8 hours 08/19/13 1726     08/19/13 1700  rifampin (RIFADIN) 300 mg in sodium chloride 0.9 % 100 mL IVPB     300 mg 200 mL/hr over 30 Minutes Intravenous Every 8 hours 08/19/13 1612     08/19/13 1300  gentamicin (GARAMYCIN) 80 mg in dextrose 5 % 50 mL IVPB  Status:  Discontinued     75 mg 104 mL/hr over 30 Minutes Intravenous Every 8 hours 08/19/13 1154 08/20/13 2334   08/19/13 1145  cefTRIAXone (ROCEPHIN) 2 g in dextrose 5 % 50 mL IVPB  Status:  Discontinued     2 g 100 mL/hr over 30 Minutes Intravenous Every 24 hours 08/19/13 1115 08/19/13 1714   08/19/13 0700  vancomycin (VANCOCIN) 1,250 mg in sodium chloride 0.9 % 250 mL IVPB     1,250 mg 166.7 mL/hr over 90 Minutes Intravenous Every 12 hours 08/18/13 1909     08/19/13 0300  piperacillin-tazobactam (ZOSYN) IVPB 3.375 g  Status:  Discontinued     3.375 g 12.5 mL/hr over 240 Minutes Intravenous Every 8 hours 08/18/13 1909 08/19/13 1115   08/18/13 1915  piperacillin-tazobactam (ZOSYN) IVPB 3.375 g       3.375 g 100 mL/hr over 30 Minutes Intravenous  Once 08/18/13 1909 08/18/13 2046   08/18/13 1915  vancomycin (VANCOCIN) 1,500 mg in sodium chloride 0.9 % 500 mL IVPB     1,500 mg 250 mL/hr over 120 Minutes Intravenous  Once 08/18/13 1909 08/18/13 2217   08/10/2013 1900  cefUROXime (ZINACEF) 1.5 g in dextrose 5 % 50 mL IVPB     1.5 g 100 mL/hr over 30 Minutes Intravenous Every 12 hours 08/19/2013 1746 08/18/13 0702   08/18/2013 0857  ceFAZolin (ANCEF) 2-3 GM-% IVPB SOLR    Comments:  Brien Mates   : cabinet override      08/27/2013 0857 08/22/2013 2114      Medications:  Scheduled: . antiseptic oral rinse  15 mL Mouth Rinse q12n4p  . atorvastatin  10 mg Oral q1800  . ceFEPime (MAXIPIME) IV  1 g Intravenous Q8H  . chlorhexidine  15 mL Mouth Rinse BID  . enoxaparin (LOVENOX) injection  95 mg Subcutaneous Q12H  . gentamicin  60 mg Intravenous Q12H  . insulin aspart  0-9 Units Subcutaneous TID WC  . metFORMIN  500 mg Oral BID WC  . potassium chloride  40 mEq Oral BID  . rifampin (  RIFADIN) IVPB  300 mg Intravenous Q8H  . sodium chloride  3 mL Intravenous Q12H  . vancomycin  1,250 mg Intravenous Q12H    Objective: Vital signs in last 24 hours: Temp:  [97.5 F (36.4 C)-98.9 F (37.2 C)] 98.8 F (37.1 C) (11/20 0753) Pulse Rate:  [46-118] 111 (11/20 0900) Resp:  [16-36] 28 (11/20 0900) BP: (59-116)/(35-82) 99/46 mmHg (11/20 0900) SpO2:  [92 %-100 %] 100 % (11/20 0900)   General appearance: alert, cooperative and no distress Resp: clear to auscultation bilaterally Cardio: regular rate and rhythm and S1 M GI: normal findings: bowel sounds normal and soft, non-tender Extremities: gangrenous L 1st and 5th toes.   Lab Results  Recent Labs  08/15/2013 0500 08/22/13 0407  WBC 11.5* 14.9*  HGB 8.7* 9.3*  HCT 25.2* 27.0*  NA 132* 133*  K 3.3* 3.7  CL 105 104  CO2 20 19  BUN 6 5*  CREATININE 0.62 0.69   Liver Panel No results found for this basename: PROT, ALBUMIN, AST, ALT,  ALKPHOS, BILITOT, BILIDIR, IBILI,  in the last 72 hours Sedimentation Rate No results found for this basename: ESRSEDRATE,  in the last 72 hours C-Reactive Protein No results found for this basename: CRP,  in the last 72 hours  Microbiology: Recent Results (from the past 240 hour(s))  CULTURE, BLOOD (ROUTINE X 2)     Status: None   Collection Time    08/18/13  8:00 PM      Result Value Range Status   Specimen Description BLOOD RIGHT ARM   Final   Special Requests BOTTLES DRAWN AEROBIC AND ANAEROBIC 5CC EA   Final   Culture  Setup Time     Final   Value: 08/19/2013 04:33     Performed at [facility]   Culture     Final   Value:        BLOOD CULTURE RECEIVED NO GROWTH TO DATE CULTURE WILL BE HELD FOR 5 DAYS BEFORE ISSUING A FINAL NEGATIVE REPORT     Performed at [facility]   Report Status PENDING   Incomplete  CULTURE, BLOOD (ROUTINE X 2)     Status: None   Collection Time    08/18/13  8:10 PM      Result Value Range Status   Specimen Description BLOOD RIGHT HAND   Final   Special Requests BOTTLES DRAWN AEROBIC AND ANAEROBIC 5CC EA   Final   Culture  Setup Time     Final   Value: 08/19/2013 04:32     Performed at [facility]   Culture     Final   Value:        BLOOD CULTURE RECEIVED NO GROWTH TO DATE CULTURE WILL BE HELD FOR 5 DAYS BEFORE ISSUING A FINAL NEGATIVE REPORT     Performed at [facility]   Report Status PENDING   Incomplete    Studies/Results: No results found.   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  Large, highly mobile vegetation on the aortic prosthesis cusps BCx 11-16 ngtd  Dr Imogene Burn to speak to family today I would not pursue any further studies until palliative care eval.   Total days of antibiotics: 5 (vanco/gent/rif/cefepime)         Johny Sax Infectious Diseases (pager) [phone] [email or web address] 08/22/2013, 11:29 AM  LOS: 6 days

## 2013-08-22 NOTE — Progress Notes (Addendum)
[facility] Progress Note [place] TEAM 1 - Stepdown/ICU TEAM   [name] [name] ZOX:[id number] DOB: 1935/10/01 DOA: 08/19/2013 PCP: Ignatius Specking., MD  Admit HPI / Brief Narrative: 78 yo with CAD, DM-2, AF on xarelto with hemorrhagic shock after emergent Left iliofemoropopliteal thromboembolectomy requiring Patch angioplasty of left iliofemoral arterial segment for acute left leg ischemia . Op EBL ~1500cc, transfused 4 u PRBC and given albumin x 2 Intraoperatively. Started on pressors in OR . PCCM asked to consult for hypotension [name] 11/15   SIGNIFICANT EVENTS / STUDIES:  11/14 CT angio bifem >embolism left common femoral , left popliteal arteries w/ extension to left peroneal artery as well as right deep femoral artery and post tibial arteries.  11/15 OR for emergent left iliofemoropopliteal thromboembolectomy /patch angioplasty  11/15 NSTEMI post op  11/15 R. IJ >> 11/16 TTE> aortic veg on bioprosthetic valve, mild LVH, normal systolic function; LA severly dilated, RA mild dilated, RVSP  Assessment/Plan:  Aortic valve bioprosthesis (2009) with acute vegetation/ Sepsis Care as per ID, Cardiology, and TCTS - results of TEE noted -Likely not a surgical candidate however, CT surgery has been contacted to provide input. Cont antibiotics per ID.   PVD w/ critical left limb ischemia s/p embolectomy/angioplasty presumably related to Aortic valve veg - L 5th toe gangrene  Vasc Surgery is directing care- holding off on surgery at this point.   Hx of OSA  QHS CPAP per home regimen   Hypotension / Hypovolemic Shock BP soft during/after TEE - resume gentle hydration - BP improved- cont hydration due to vomiting and poor PO intake  CAD s/p CABG 2000 - NSTEMI vs demand w/ postive enzymes  As per Cardiology  Atrial fib on Xarelto prior to admission  NSR at time of exam today - full dose lovenox anticoag   Renal Insuffiency  Resolved - keep hydrated   Hypokalemia/  Hypomagnesemia Replace and follow - re-check Mg in AM  Protein calorie malnutrition  Nausea/ Vomiting - increase frequency of Zofran and add PRN Phenergan.   Anemia -post op blood loss  S/p 4 u PRBC and 2 albumin during surgery 11/15   DM Reasonably well controlled at this time - follow trend   Dementia / Delirium  Code Status: DNR Family Communication: no family present at time of exam Disposition Plan: SDU   Consultants: ID Cardiology PCCM >> [facility] Vasc Surgery    Antibiotics: 11/15 Zinacef post op  11/17 vanc >>  11/17 zosyn >>11/17  11/17 cefepime >>  11/17 rifampin >>  DVT prophylaxis: Full dose lovenox  HPI/Subjective: Pt is quite depressed. Having a difficult time communicating- states that he's been told he is going to die soon. I have discussed code status and he does NOT want Intubation or CPR and therefore will change status to DNR.  Also, quite nauseated today- poor PO intake.   Objective: Blood pressure 103/77, pulse 109, temperature 98 F (36.7 C), temperature source Oral, resp. rate 30, height 5\' 11"  (1.803 m), weight 97.7 kg (215 lb 6.2 oz), SpO2 100.00%.  Intake/Output Summary (Last 24 hours) at 08/22/13 1251 Last data filed at 08/22/13 0757  Gross per 24 hour  Intake    100 ml  Output   1075 ml  Net   -975 ml   Exam: General: No acute respiratory distress Lungs: Clear to auscultation bilaterally without wheezes or crackles Cardiovascular: Regular rate without gallop or rub  Abdomen: Nontender, nondistended, soft, bowel sounds positive, no rebound, no ascites, no  appreciable mass Extremities: No significant cyanosis, clubbing- edema of left foot and ankle L great toe which is necrotic/black  Data Reviewed: Basic Metabolic Panel:  Recent Labs Lab 08/18/13 0300 08/19/13 0423 08/20/13 0405 08/28/13 0500 08/22/13 0407  NA 133* 136 134* 132* 133*  K 4.1 3.5 3.3* 3.3* 3.7  CL 101 106 105 105 104  CO2 20 19 20 20 19   GLUCOSE 109* 98 93  108* 114*  BUN 16 10 8 6  5*  CREATININE 0.96 0.85 0.67 0.62 0.69  CALCIUM 8.1* 7.7* 7.4* 7.2* 7.3*  MG  --   --   --   --  1.3*   Liver Function Tests:  Recent Labs Lab 09/01/2013 2216 08/18/13 0300 08/19/13 0423  AST 10 11 23   ALT 5 <5 <5  ALKPHOS 43 48 50  BILITOT 0.9 0.9 0.7  PROT 5.0* 5.5* 5.1*  ALBUMIN 2.2* 2.4* 2.0*   CBC:  Recent Labs Lab 08/11/2013 2050 08/18/13 0300 08/19/13 0423 08/20/13 0405 28-Aug-2013 0500 08/22/13 0407  WBC 18.6* 19.3* 14.0* 10.4 11.5* 14.9*  NEUTROABS 16.2*  --   --   --   --   --   HGB 9.9* 9.8* 8.7* 8.3* 8.7* 9.3*  HCT 28.1* 27.7* 24.7* 24.0* 25.2* 27.0*  MCV 85.2 85.0 85.2 86.0 86.6 86.3  PLT 174 210 199 206 217 258   Cardiac Enzymes:  Recent Labs Lab 08/21/2013 1528 09/01/2013 2217 08/18/13 0300  CKTOTAL 14 19 22   CKMB 2.1 3.1 3.5  TROPONINI <0.30 0.38* 0.49*   BNP (last 3 results) No results found for this basename: PROBNP,  in the last 8760 hours CBG:  Recent Labs Lab 2013/08/28 1958 2013/08/28 2323 08/22/13 0357 08/22/13 0756 08/22/13 1145  GLUCAP 145* 117* 107* 105* 120*    Recent Results (from the past 240 hour(s))  CULTURE, BLOOD (ROUTINE X 2)     Status: None   Collection Time    08/18/13  8:00 PM      Result Value Range Status   Specimen Description BLOOD RIGHT ARM   Final   Special Requests BOTTLES DRAWN AEROBIC AND ANAEROBIC 5CC EA   Final   Culture  Setup Time     Final   Value: 08/19/2013 04:33     Performed at [facility]   Culture     Final   Value:        BLOOD CULTURE RECEIVED NO GROWTH TO DATE CULTURE WILL BE HELD FOR 5 DAYS BEFORE ISSUING A FINAL NEGATIVE REPORT     Performed at [facility]   Report Status PENDING   Incomplete  CULTURE, BLOOD (ROUTINE X 2)     Status: None   Collection Time    08/18/13  8:10 PM      Result Value Range Status   Specimen Description BLOOD RIGHT HAND   Final   Special Requests BOTTLES DRAWN AEROBIC AND ANAEROBIC 5CC EA   Final   Culture  Setup Time      Final   Value: 08/19/2013 04:32     Performed at [facility]   Culture     Final   Value:        BLOOD CULTURE RECEIVED NO GROWTH TO DATE CULTURE WILL BE HELD FOR 5 DAYS BEFORE ISSUING A FINAL NEGATIVE REPORT     Performed at [facility]   Report Status PENDING   Incomplete     Studies:  Recent x-ray studies have been reviewed in detail  by the Attending Physician  Scheduled Meds:  Scheduled Meds: . antiseptic oral rinse  15 mL Mouth Rinse q12n4p  . atorvastatin  10 mg Oral q1800  . ceFEPime (MAXIPIME) IV  1 g Intravenous Q8H  . chlorhexidine  15 mL Mouth Rinse BID  . enoxaparin (LOVENOX) injection  95 mg Subcutaneous Q12H  . gentamicin  60 mg Intravenous Q12H  . insulin aspart  0-9 Units Subcutaneous TID WC  . metFORMIN  500 mg Oral BID WC  . potassium chloride  40 mEq Oral BID  . rifampin (RIFADIN) IVPB  300 mg Intravenous Q8H  . sodium chloride  3 mL Intravenous Q12H  . vancomycin  1,250 mg Intravenous Q12H    Time spent on care of this patient: 45 mins   Calvert Cantor, MD  [facility] Office  [phone] Pager - Text Page per Loretha Stapler as per below:  On-Call/Text Page:      [email or web address]      password [email or web address]  If 7PM-7AM, please contact night-coverage [email or web address] Password [email or web address] 08/22/2013, 12:51 PM   LOS: 6 days

## 2013-08-22 NOTE — Progress Notes (Signed)
Read, reviewed, and agree.  Melynda Krzywicki B. Dametrius Sanjuan, PT, DPT #[phone]  

## 2013-08-22 NOTE — Progress Notes (Signed)
Pt refused CPAP for tonight. Pt was made aware if he changes his mind to contact RT. Pt is resting comfortably on room air SpO2 100%. RT will continue to monitor as needed.

## 2013-08-23 DIAGNOSIS — Z515 Encounter for palliative care: Secondary | ICD-10-CM

## 2013-08-23 DIAGNOSIS — R110 Nausea: Secondary | ICD-10-CM

## 2013-08-23 LAB — CBC
HCT: 25.9 % — ABNORMAL LOW (ref 39.0–52.0)
MCH: 29.6 pg (ref 26.0–34.0)
MCV: 88.1 fL (ref 78.0–100.0)
Platelets: 296 10*3/uL (ref 150–400)
RBC: 2.94 MIL/uL — ABNORMAL LOW (ref 4.22–5.81)
RDW: 16.3 % — ABNORMAL HIGH (ref 11.5–15.5)
WBC: 15.3 10*3/uL — ABNORMAL HIGH (ref 4.0–10.5)

## 2013-08-23 LAB — GLUCOSE, CAPILLARY
Glucose-Capillary: 107 mg/dL — ABNORMAL HIGH (ref 70–99)
Glucose-Capillary: 111 mg/dL — ABNORMAL HIGH (ref 70–99)

## 2013-08-23 MED ORDER — LORAZEPAM 2 MG/ML IJ SOLN
1.0000 mg | INTRAMUSCULAR | Status: DC | PRN
  Administered 2013-08-23 – 2013-08-24 (×3): 1 mg via INTRAVENOUS
  Administered 2013-08-25: 0.5 mg via INTRAVENOUS
  Filled 2013-08-23 (×5): qty 1

## 2013-08-23 MED ORDER — LEVALBUTEROL HCL 0.63 MG/3ML IN NEBU
0.6300 mg | INHALATION_SOLUTION | Freq: Four times a day (QID) | RESPIRATORY_TRACT | Status: DC | PRN
  Administered 2013-08-23 – 2013-08-24 (×2): 0.63 mg via RESPIRATORY_TRACT
  Filled 2013-08-23 (×2): qty 3

## 2013-08-23 MED ORDER — MORPHINE SULFATE 2 MG/ML IJ SOLN
2.0000 mg | INTRAMUSCULAR | Status: DC | PRN
  Administered 2013-08-23 – 2013-08-25 (×12): 2 mg via INTRAVENOUS
  Filled 2013-08-23 (×13): qty 1

## 2013-08-23 MED ORDER — PROMETHAZINE HCL 25 MG/ML IJ SOLN
12.5000 mg | INTRAMUSCULAR | Status: DC | PRN
  Administered 2013-08-24 – 2013-08-25 (×3): 12.5 mg via INTRAVENOUS
  Filled 2013-08-23 (×3): qty 1

## 2013-08-23 MED ORDER — LEVALBUTEROL HCL 0.63 MG/3ML IN NEBU: 0.6300 mg | INHALATION_SOLUTION | Freq: Once | RESPIRATORY_TRACT | Status: DC

## 2013-08-23 MED ORDER — ONDANSETRON HCL 4 MG/2ML IJ SOLN
4.0000 mg | Freq: Three times a day (TID) | INTRAMUSCULAR | Status: DC
  Administered 2013-08-23 – 2013-08-25 (×5): 4 mg via INTRAVENOUS
  Filled 2013-08-23 (×5): qty 2

## 2013-08-23 MED ORDER — LOPERAMIDE HCL 2 MG PO CAPS
4.0000 mg | ORAL_CAPSULE | ORAL | Status: DC | PRN
  Filled 2013-08-23: qty 2

## 2013-08-23 NOTE — Progress Notes (Signed)
Physical Therapy Discharge Patient Details Name: [name] [name] MRN: [id number] DOB: 1934/10/30 Today's Date: 08/23/2013 Time:  -     Patient discharged from PT services secondary to pt has chosen full comfort care and plans to d/c to hospice facility. .  Please see latest therapy progress note for current level of functioning and progress toward goals.    Progress and discharge plan discussed with patient and/or caregiver: Patient/Caregiver agrees with plan  GP     Jatziry Wechter 08/23/2013, 2:09 PM   Jake Shark, PT DPT [phone]

## 2013-08-23 NOTE — Progress Notes (Signed)
   Daily Progress Note  Assessment/Planning:  POD #6 s/p L iliofem TE, L iliofem EA with BPA; perivalvular aortic valve abscess, prosthetic AV vegetation, thromboembolism to BLE, L 1st and 5th toe dry gangrene  No surgical options per CT Surg. Palliative care consult. Given the global prognosis, I would proceed with local wound care to left foot for now. Paint left gangrenous toes with betadine daily and cushion foot. Staples in L groin incision can come out in one week I will be off next week, but one of my partners will check in on the patient next week.  Subjective   No complaints, foot ok  Objective Filed Vitals:   08/22/13 1902 08/23/13 0000 08/23/13 0400 08/23/13 0409  BP: 96/70 98/62 83/55  95/71  Pulse:  114 106 104  Temp: 98.7 F (37.1 C) 98.7 F (37.1 C) 99.2 F (37.3 C)   TempSrc: Oral Oral Oral   Resp: 28 33 33 28  Height:      Weight:      SpO2: 100% 100% 100% 100%    Intake/Output Summary (Last 24 hours) at 08/23/13 0738 Last data filed at 08/23/13 0400  Gross per 24 hour  Intake 1861.25 ml  Output   1325 ml  Net 536.25 ml   PULM CTAB  CV RRR  GI soft, NTND  VASC L groin c/d/i, staples intact, no drainage, L foot warm and edematous 1+, L 1st and 5th toe with dry gangrene at this time without obvious cellulitis  Laboratory CBC    Component Value Date/Time   WBC 15.3* 08/23/2013 0435   HGB 8.7* 08/23/2013 0435   HCT 25.9* 08/23/2013 0435   PLT 296 08/23/2013 0435    BMET    Component Value Date/Time   NA 133* 08/22/2013 0407   K 3.7 08/22/2013 0407   CL 104 08/22/2013 0407   CO2 19 08/22/2013 0407   GLUCOSE 114* 08/22/2013 0407   BUN 5* 08/22/2013 0407   CREATININE 0.69 08/22/2013 0407   CREATININE 0.69 05/24/2013 1035   CALCIUM 7.3* 08/22/2013 0407   GFRNONAA 90* 08/22/2013 0407   GFRAA >90 08/22/2013 0407    Leonides Sake, MD [facility] Office: [phone] Pager: [phone]  08/23/2013, 7:38  AM

## 2013-08-23 NOTE — Consult Note (Addendum)
Palliative Medicine Team Consult Note  [name] [name] is a 78yo man with multiple medical problems including CAD sp CABG in 2000, prosthetic AV, DM2 complicated, and on chronic anticoagulation for A. Fib admitted with acute ischemia of his L great toe. He underwent Left iliofemoropopliteal thromboembolectomy requiring Patch angioplasty of left iliofemoral arterial segment for acute left leg ischemia. Subsequently he was found to have a large abscess on his Aortic valve identified as thrombotic source.He has a very poor prognosis given this valve abscess. He is at high risk for valve rupture and sudden cardiac death. PMT consulted for goals of care discussion.  I met with patient and his wife to discuss goals of care. There is very little left that can be done to help this gentleman outside of palliative care and a focus on his comfort and QOL. He would not survive valve surgery and given his chronic debility he is appropriate for Hospice Care.  Summary of Goals:  1. DNR 2. Full Comfort Care, medial options have been exhausted, we discussed long term antibiotics IV but these will likely confer little benefit to an abscess and he is having nausea and diarrhea. Consider oral regimen for suppression only-discussed with patient that there is no cure. 3. Aggressive control of symptoms-he wants to be in a space without cords and feeling tied down. 4. Agrees to Hospice Care. Wife cannot care for him at home given his total care needs.  Disposition: He is appropriate for a Hospice Facility-he has wounds that need to be managed, pain from his ischemic foot and may have acute rupture of his aortic valve requiring expert symptom management at EOL.  Active Symptoms:  1. Severe Nausea, vomited while we were in the room, gave him dose of Zofran 2. Dyspnea, he was wheezing, and SOB, placed him on 2L nasal cannula O2 for comfort 3. Pain, lower limb ischemia 4. Painful wounds and ulcerations from extremity emboli,  continue betadine drgs  Will start prn morphine for pain and dyspnea. He has TLC.    Psychsocial: Married to his wife for 51 years He has two sons -Called and updated [name] [name]: [id number] [name] [name]: [phone] He also has two adopted sons who live in the home. He is retired from [employer], did Lobbyist work  Recs:  Recommend transfer to [facility] for comfort care and until disposition can be determined. IV Morphine PRN Scheduled Zofran for Nausea Consider alternative abx regimen, patient having nausea vomiting and diarrhea Lopiramide for diarrhea  I anticipate a hospital death, but if he stabilizes we may need to consider transfer to a hospice facility.  70 minutes. Greater than 50%  of this time was spent counseling and coordinating care related to the above assessment and plan.   [name] Malta, DO Palliative Medicine

## 2013-08-23 NOTE — Progress Notes (Signed)
Palliative Medicine Team consult for goals of care discussion received spoke with patient briefly who was very lethargic, he requested writer speak with his wife -contacted wife Lynden Ang [phone] she is on her way to the hospital meeting scheduled for today 08/23/13 @ 11:00 am  Valente David, RN 08/23/2013, 10:15 AM Palliative Medicine Team RN Liaison [phone]

## 2013-08-23 NOTE — Progress Notes (Signed)
[facility]   Day # 5 cefepime Day # 5 gentamicin Day # 5 vancomycin Day # 5 rifampin Subjective: No new complaints   Antibiotics:  Anti-infectives   Start     Dose/Rate Route Frequency Ordered Stop   09/15/13 1000  gentamicin (GARAMYCIN) IVPB 60 mg     60 mg 100 mL/hr over 30 Minutes Intravenous Every 12 hours 08/20/13 2334     08/19/13 1830  ceFEPIme (MAXIPIME) 1 g in dextrose 5 % 50 mL IVPB     1 g 100 mL/hr over 30 Minutes Intravenous Every 8 hours 08/19/13 1726     08/19/13 1700  rifampin (RIFADIN) 300 mg in sodium chloride 0.9 % 100 mL IVPB  Status:  Discontinued     300 mg 200 mL/hr over 30 Minutes Intravenous Every 8 hours 08/19/13 1612 08/23/13 1407   08/19/13 1300  gentamicin (GARAMYCIN) 80 mg in dextrose 5 % 50 mL IVPB  Status:  Discontinued     75 mg 104 mL/hr over 30 Minutes Intravenous Every 8 hours 08/19/13 1154 08/20/13 2334   08/19/13 1145  cefTRIAXone (ROCEPHIN) 2 g in dextrose 5 % 50 mL IVPB  Status:  Discontinued     2 g 100 mL/hr over 30 Minutes Intravenous Every 24 hours 08/19/13 1115 08/19/13 1714   08/19/13 0700  vancomycin (VANCOCIN) 1,250 mg in sodium chloride 0.9 % 250 mL IVPB     1,250 mg 166.7 mL/hr over 90 Minutes Intravenous Every 12 hours 08/18/13 1909     08/19/13 0300  piperacillin-tazobactam (ZOSYN) IVPB 3.375 g  Status:  Discontinued     3.375 g 12.5 mL/hr over 240 Minutes Intravenous Every 8 hours 08/18/13 1909 08/19/13 1115   08/18/13 1915  piperacillin-tazobactam (ZOSYN) IVPB 3.375 g     3.375 g 100 mL/hr over 30 Minutes Intravenous  Once 08/18/13 1909 08/18/13 2046   08/18/13 1915  vancomycin (VANCOCIN) 1,500 mg in sodium chloride 0.9 % 500 mL IVPB     1,500 mg 250 mL/hr over 120 Minutes Intravenous  Once 08/18/13 1909 08/18/13 2217   08/05/2013 1900  cefUROXime (ZINACEF) 1.5 g in dextrose 5 % 50 mL IVPB     1.5 g 100 mL/hr over 30 Minutes Intravenous Every 12 hours 08/04/2013 1746 08/18/13 0702   08/04/2013  0857  ceFAZolin (ANCEF) 2-3 GM-% IVPB SOLR    Comments:  Brien Mates   : cabinet override      08/23/2013 0857 09/01/2013 2114      Medications: Scheduled Meds: . antiseptic oral rinse  15 mL Mouth Rinse q12n4p  . atorvastatin  10 mg Oral q1800  . ceFEPime (MAXIPIME) IV  1 g Intravenous Q8H  . chlorhexidine  15 mL Mouth Rinse BID  . enoxaparin (LOVENOX) injection  95 mg Subcutaneous Q12H  . gentamicin  60 mg Intravenous Q12H  . levalbuterol  0.63 mg Nebulization Once  . ondansetron  4 mg Intravenous Q8H  . potassium chloride  40 mEq Oral BID  . sodium chloride  3 mL Intravenous Q12H  . vancomycin  1,250 mg Intravenous Q12H   Continuous Infusions: . sodium chloride 75 mL/hr at 08/22/13 1800   PRN Meds:.acetaminophen, acetaminophen, alum & mag hydroxide-simeth, bisacodyl, levalbuterol, LORazepam, morphine injection, oxyCODONE-acetaminophen, phenol, polyvinyl alcohol, promethazine, senna-docusate   Objective: Weight change:   Intake/Output Summary (Last 24 hours) at 08/23/13 1408 Last data filed at 08/23/13 1200  Gross per 24 hour  Intake 2356.25 ml  Output   1550  ml  Net 806.25 ml   Blood pressure 99/70, pulse 104, temperature 97.6 F (36.4 C), temperature source Oral, resp. rate 33, height 5\' 11"  (1.803 m), weight 215 lb 6.2 oz (97.7 kg), SpO2 100.00%. Temp:  [97.6 F (36.4 C)-99.2 F (37.3 C)] 97.6 F (36.4 C) (11/21 1102) Pulse Rate:  [104-114] 104 (11/21 0409) Resp:  [28-33] 33 (11/21 1102) BP: (83-99)/(55-71) 99/70 mmHg (11/21 1102) SpO2:  [98 %-100 %] 100 % (11/21 1102)  Physical Exam: General: Alert and awake, oriented being turned to be cleaned after BM Lab Results:  Recent Labs  08/22/13 0407 08/23/13 0435  WBC 14.9* 15.3*  HGB 9.3* 8.7*  HCT 27.0* 25.9*  PLT 258 296    BMET  Recent Labs  08/12/2013 0500 08/22/13 0407  NA 132* 133*  K 3.3* 3.7  CL 105 104  CO2 20 19  GLUCOSE 108* 114*  BUN 6 5*  CREATININE 0.62 0.69  CALCIUM 7.2* 7.3*     Micro Results: Recent Results (from the past 240 hour(s))  CULTURE, BLOOD (ROUTINE X 2)     Status: None   Collection Time    08/18/13  8:00 PM      Result Value Range Status   Specimen Description BLOOD RIGHT ARM   Final   Special Requests BOTTLES DRAWN AEROBIC AND ANAEROBIC 5CC EA   Final   Culture  Setup Time     Final   Value: 08/19/2013 04:33     Performed at [facility]   Culture     Final   Value:        BLOOD CULTURE RECEIVED NO GROWTH TO DATE CULTURE WILL BE HELD FOR 5 DAYS BEFORE ISSUING A FINAL NEGATIVE REPORT     Performed at [facility]   Report Status PENDING   Incomplete  CULTURE, BLOOD (ROUTINE X 2)     Status: None   Collection Time    08/18/13  8:10 PM      Result Value Range Status   Specimen Description BLOOD RIGHT HAND   Final   Special Requests BOTTLES DRAWN AEROBIC AND ANAEROBIC 5CC EA   Final   Culture  Setup Time     Final   Value: 08/19/2013 04:32     Performed at [facility]   Culture     Final   Value:        BLOOD CULTURE RECEIVED NO GROWTH TO DATE CULTURE WILL BE HELD FOR 5 DAYS BEFORE ISSUING A FINAL NEGATIVE REPORT     Performed at [facility]   Report Status PENDING   Incomplete    Studies/Results: No results found.    Assessment/Plan: [name] [name] is a 78 y.o. 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 Please call with further questions   LOS: 7 days   [name] 08/23/2013,  2:08 PM

## 2013-08-23 NOTE — Progress Notes (Signed)
PHARMACY FOLLOW UP NOTE   Pharmacy Consult for : Lovenox : 2. Vancomycin, Gentamicin Indication: Critical left limb ischemia s/p embolectomy/angioplasty  2. Aortic valve endocarditis with perivalvular abscess  Labs:  Recent Labs  08-31-2013 0500 08/22/13 0407 08/23/13 0435  HGB 8.7* 9.3* 8.7*  HCT 25.2* 27.0* 25.9*  PLT 217 258 296  CREATININE 0.62 0.69  --    Lab Results  Component Value Date   INR 0.98 04/02/2013   INR 1.06 11/21/2012   INR 0.93 09/06/2010    Estimated Creatinine Clearance: 93.7 ml/min (by C-G formula based on Cr of 0.69).  Pertinent Medications:  Scheduled:  . antiseptic oral rinse  15 mL Mouth Rinse q12n4p  . atorvastatin  10 mg Oral q1800  . ceFEPime (MAXIPIME) IV  1 g Intravenous Q8H  . chlorhexidine  15 mL Mouth Rinse BID  . enoxaparin (LOVENOX) injection  95 mg Subcutaneous Q12H  . gentamicin  60 mg Intravenous Q12H  . insulin aspart  0-9 Units Subcutaneous TID WC  . metFORMIN  500 mg Oral BID WC  . potassium chloride  40 mEq Oral BID  . rifampin (RIFADIN) IVPB  300 mg Intravenous Q8H  . sodium chloride  3 mL Intravenous Q12H  . vancomycin  1,250 mg Intravenous Q12H   Infusions:  . sodium chloride 75 mL/hr at 08/22/13 1800   Anti-infectives   Start     Dose/Rate Route Frequency Ordered Stop   08/31/13 1000  gentamicin (GARAMYCIN) IVPB 60 mg     60 mg 100 mL/hr over 30 Minutes Intravenous Every 12 hours 08/20/13 2334     08/19/13 1830  ceFEPIme (MAXIPIME) 1 g in dextrose 5 % 50 mL IVPB     1 g 100 mL/hr over 30 Minutes Intravenous Every 8 hours 08/19/13 1726     08/19/13 1700  rifampin (RIFADIN) 300 mg in sodium chloride 0.9 % 100 mL IVPB     300 mg 200 mL/hr over 30 Minutes Intravenous Every 8 hours 08/19/13 1612     08/19/13 1300  gentamicin (GARAMYCIN) 80 mg in dextrose 5 % 50 mL IVPB  Status:  Discontinued     75 mg 104 mL/hr over 30 Minutes Intravenous Every 8 hours 08/19/13 1154 08/20/13 2334   08/19/13 1145  cefTRIAXone (ROCEPHIN) 2  g in dextrose 5 % 50 mL IVPB  Status:  Discontinued     2 g 100 mL/hr over 30 Minutes Intravenous Every 24 hours 08/19/13 1115 08/19/13 1714   08/19/13 0700  vancomycin (VANCOCIN) 1,250 mg in sodium chloride 0.9 % 250 mL IVPB     1,250 mg 166.7 mL/hr over 90 Minutes Intravenous Every 12 hours 08/18/13 1909     08/19/13 0300  piperacillin-tazobactam (ZOSYN) IVPB 3.375 g  Status:  Discontinued     3.375 g 12.5 mL/hr over 240 Minutes Intravenous Every 8 hours 08/18/13 1909 08/19/13 1115   08/18/13 1915  piperacillin-tazobactam (ZOSYN) IVPB 3.375 g     3.375 g 100 mL/hr over 30 Minutes Intravenous  Once 08/18/13 1909 08/18/13 2046   08/18/13 1915  vancomycin (VANCOCIN) 1,500 mg in sodium chloride 0.9 % 500 mL IVPB     1,500 mg 250 mL/hr over 120 Minutes Intravenous  Once 08/18/13 1909 08/18/13 2217   08/31/2013 1900  cefUROXime (ZINACEF) 1.5 g in dextrose 5 % 50 mL IVPB     1.5 g 100 mL/hr over 30 Minutes Intravenous Every 12 hours 08/27/2013 1746 08/18/13 0702   08/09/2013 0857  ceFAZolin (ANCEF) 2-3  GM-% IVPB SOLR    Comments:  Brien Mates   : cabinet override      08/10/2013 0857 08/26/2013 2114     Assessment:  78 y/o male on full dose anticoagulation for critical left limb ischemia s/p embolectomy/angioplasty related to AV veg.  He is also on Gentamicin and Vancomycin, in addition to Rifampin and Cefepime for Aortic valve endocarditis with perivalvular abscess  Will prob need lifelong abx for this. Previous levels were ok. Will get another one next week  Goal:  Anti-Xa level 0.6-1.2 units/ml 4hrs after LMWH dose given  Gentamicin trough level < 1 mcg/ml, [peaks for synergy 3 - 4 mcg/ml]  Vancomycin trough 15 - 20 mcg/ml.   Plan: 1.  Continue vanc 1.25g IV q12 2.  Cont gentamicin 60mg  IV q12 3.  Cont lovenox 95mg  SQ q12

## 2013-08-23 NOTE — Progress Notes (Signed)
[facility]  Progress Note Bypass Surgery  Date of Surgery: 08/26/2013   Procedure(s): 1. Left iliofemoropopliteal thromboembolectomy 2. Left iliofemoral endarterectomy 3. Patch angioplasty of left iliofemoral arterial segment 4. Open cannulation of common femoral artery  5. Left leg runoff  History of Present Illness  [name] [name] is a 78 y.o. male who is  7 Days Post-Op. Pt states his left foot feel ok. No sig pain. Toes demarcation.   Significant Diagnostic Studies: CBC Lab Results  Component Value Date   WBC 15.3* 08/23/2013   HGB 8.7* 08/23/2013   HCT 25.9* 08/23/2013   MCV 88.1 08/23/2013   PLT 296 08/23/2013    BMET    Component Value Date/Time   NA 133* 08/22/2013 0407   K 3.7 08/22/2013 0407   CL 104 08/22/2013 0407   CO2 19 08/22/2013 0407   GLUCOSE 114* 08/22/2013 0407   BUN 5* 08/22/2013 0407   CREATININE 0.69 08/22/2013 0407   CREATININE 0.69 05/24/2013 1035   CALCIUM 7.3* 08/22/2013 0407   GFRNONAA 90* 08/22/2013 0407   GFRAA >90 08/22/2013 0407    COAG Lab Results  Component Value Date   INR 0.98 04/02/2013   INR 1.06 11/21/2012   INR 0.93 09/06/2010   No results found for this basename: PTT    Physical Examination  BP Readings from Last 3 Encounters:  08/23/13 95/71  08/23/13 95/71  08/23/13 95/71   Temp Readings from Last 3 Encounters:  08/23/13 99.2 F (37.3 C) Oral  08/23/13 99.2 F (37.3 C) Oral  08/23/13 99.2 F (37.3 C) Oral   SpO2 Readings from Last 3 Encounters:  08/23/13 100%  08/23/13 100%  08/23/13 100%   Pulse Readings from Last 3 Encounters:  08/23/13 104  08/23/13 104  08/23/13 104    Pt is A&O x 3 left lower extremity: Incision/s is/are clean,dry.intact, and  healing without hematoma, erythema or drainage Limb is warm; with good color 1st and 5th toes continue to demarcate Good sensation and motion  Assessment/Plan: Pt. Doing well Wounds are healing well - keep left groin wound  dry Continue wound care as ordered Cont antibiotics for vegetation on heart valve.  Ayoub Arey J  08/23/2013 7:40 AM

## 2013-08-23 NOTE — Progress Notes (Signed)
[facility] Progress Note [place] TEAM 1 - Stepdown/ICU TEAM   [name] [name] ZOX:[id number] DOB: Nov 03, 1934 DOA: 08/04/2013 PCP: Ignatius Specking., MD  Admit HPI / Brief Narrative: 78 yo with CAD, DM-2, AF on xarelto with hemorrhagic shock after emergent Left iliofemoropopliteal thromboembolectomy requiring Patch angioplasty of left iliofemoral arterial segment for acute left leg ischemia . Op EBL ~1500cc, transfused 4 u PRBC and given albumin x 2 Intraoperatively. Started on pressors in OR . PCCM asked to consult for hypotension [name] [name] 11/15   SIGNIFICANT EVENTS / STUDIES:  11/14 CT angio bifem >embolism left common femoral , left popliteal arteries w/ extension to left peroneal artery as well as right deep femoral artery and post tibial arteries.  11/15 OR for emergent left iliofemoropopliteal thromboembolectomy /patch angioplasty  11/15 NSTEMI post op  11/15 R. IJ >> 11/16 TTE> aortic veg on bioprosthetic valve, mild LVH, normal systolic function; LA severly dilated, RA mild dilated, RVSP  11/21- Palliative care meeting- decided upon comfort care  Assessment/Plan:  Aortic valve bioprosthesis (2009) with acute vegetation/ Sepsis Care as per ID, Cardiology, and TCTS - results of TEE noted - not a surgical candidate however, - antibiotics stopped due to contribution to vomiting- will be transferred to Hospice when bed available   PVD w/ critical left limb ischemia s/p embolectomy/angioplasty presumably related to Aortic valve veg - L 5th toe gangrene  Vasc Surgery is holding off on surgery at this point.   Hx of OSA  QHS CPAP per home regimen   Hypotension / Hypovolemic Shock BP soft during/after TEE - resume gentle hydration - BP improved  CAD s/p CABG 2000 - NSTEMI vs demand w/ postive enzymes  As per Cardiology  Atrial fib on Xarelto prior to admission   Renal Insuffiency  Resolved - keep hydrated   Hypokalemia/ Hypomagnesemia Replace and follow -  re-check Mg in AM  Protein calorie malnutrition  Nausea/ Vomiting - increase frequency of Zofran and add PRN Phenergan.   Anemia -post op blood loss  S/p 4 u PRBC and 2 albumin during surgery 11/15   DM Reasonably well controlled at this time - follow trend   Dementia / Delirium  Code Status: DNR Family Communication: no family present at time of exam Disposition Plan: transfer to med/surg  Consultants: ID Cardiology PCCM >> [facility] Vasc Surgery    Antibiotics: 11/15 Zinacef post op  11/17 vanc >>  11/17 zosyn >>11/17  11/17 cefepime >>  11/17 rifampin >>  DVT prophylaxis: Full dose lovenox  HPI/Subjective: Pt is resting- spoke with wife- palliative care have met with pt and wife and he is now comfort care   Objective: Blood pressure 99/70, pulse 104, temperature 97.6 F (36.4 C), temperature source Oral, resp. rate 33, height 5\' 11"  (1.803 m), weight 97.7 kg (215 lb 6.2 oz), SpO2 100.00%.  Intake/Output Summary (Last 24 hours) at 08/23/13 1416 Last data filed at 08/23/13 1200  Gross per 24 hour  Intake 2356.25 ml  Output   1550 ml  Net 806.25 ml   Exam: General: No acute respiratory distress Lungs: Clear to auscultation bilaterally without wheezes or crackles Cardiovascular: Regular rate without gallop or rub  Abdomen: Nontender, nondistended, soft, bowel sounds positive, no rebound, no ascites, no appreciable mass Extremities: No significant cyanosis, clubbing- edema of left foot and ankle L great toe which is necrotic/black  Data Reviewed: Basic Metabolic Panel:  Recent Labs Lab 08/18/13 0300 08/19/13 0423 08/20/13 0405 08-24-13 0500 08/22/13 0407 08/23/13 0435  NA 133* 136 134* 132* 133*  --   K 4.1 3.5 3.3* 3.3* 3.7  --   CL 101 106 105 105 104  --   CO2 20 19 20 20 19   --   GLUCOSE 109* 98 93 108* 114*  --   BUN 16 10 8 6  5*  --   CREATININE 0.96 0.85 0.67 0.62 0.69  --   CALCIUM 8.1* 7.7* 7.4* 7.2* 7.3*  --   MG  --   --   --   --  1.3*  1.7   Liver Function Tests:  Recent Labs Lab August 20, 2013 2216 08/18/13 0300 08/19/13 0423  AST 10 11 23   ALT 5 <5 <5  ALKPHOS 43 48 50  BILITOT 0.9 0.9 0.7  PROT 5.0* 5.5* 5.1*  ALBUMIN 2.2* 2.4* 2.0*   CBC:  Recent Labs Lab 20-Aug-2013 2050  08/19/13 0423 08/20/13 0405 08/08/2013 0500 08/22/13 0407 08/23/13 0435  WBC 18.6*  < > 14.0* 10.4 11.5* 14.9* 15.3*  NEUTROABS 16.2*  --   --   --   --   --   --   HGB 9.9*  < > 8.7* 8.3* 8.7* 9.3* 8.7*  HCT 28.1*  < > 24.7* 24.0* 25.2* 27.0* 25.9*  MCV 85.2  < > 85.2 86.0 86.6 86.3 88.1  PLT 174  < > 199 206 217 258 296  < > = values in this interval not displayed. Cardiac Enzymes:  Recent Labs Lab 20-Aug-2013 1528 2013-08-20 2217 08/18/13 0300  CKTOTAL 14 19 22   CKMB 2.1 3.1 3.5  TROPONINI <0.30 0.38* 0.49*   BNP (last 3 results) No results found for this basename: PROBNP,  in the last 8760 hours CBG:  Recent Labs Lab 08/22/13 1145 08/22/13 1655 08/22/13 2123 08/23/13 0742 08/23/13 1205  GLUCAP 120* 110* 100* 107* 129*    Recent Results (from the past 240 hour(s))  CULTURE, BLOOD (ROUTINE X 2)     Status: None   Collection Time    08/18/13  8:00 PM      Result Value Range Status   Specimen Description BLOOD RIGHT ARM   Final   Special Requests BOTTLES DRAWN AEROBIC AND ANAEROBIC 5CC EA   Final   Culture  Setup Time     Final   Value: 08/19/2013 04:33     Performed at [facility]   Culture     Final   Value:        BLOOD CULTURE RECEIVED NO GROWTH TO DATE CULTURE WILL BE HELD FOR 5 DAYS BEFORE ISSUING A FINAL NEGATIVE REPORT     Performed at [facility]   Report Status PENDING   Incomplete  CULTURE, BLOOD (ROUTINE X 2)     Status: None   Collection Time    08/18/13  8:10 PM      Result Value Range Status   Specimen Description BLOOD RIGHT HAND   Final   Special Requests BOTTLES DRAWN AEROBIC AND ANAEROBIC 5CC EA   Final   Culture  Setup Time     Final   Value: 08/19/2013 04:32     Performed  at [facility]   Culture     Final   Value:        BLOOD CULTURE RECEIVED NO GROWTH TO DATE CULTURE WILL BE HELD FOR 5 DAYS BEFORE ISSUING A FINAL NEGATIVE REPORT     Performed at [facility]   Report Status PENDING   Incomplete  Studies:  Recent x-ray studies have been reviewed in detail by the Attending Physician  Scheduled Meds:  Scheduled Meds: . antiseptic oral rinse  15 mL Mouth Rinse q12n4p  . atorvastatin  10 mg Oral q1800  . ceFEPime (MAXIPIME) IV  1 g Intravenous Q8H  . chlorhexidine  15 mL Mouth Rinse BID  . enoxaparin (LOVENOX) injection  95 mg Subcutaneous Q12H  . gentamicin  60 mg Intravenous Q12H  . levalbuterol  0.63 mg Nebulization Once  . ondansetron  4 mg Intravenous Q8H  . potassium chloride  40 mEq Oral BID  . sodium chloride  3 mL Intravenous Q12H  . vancomycin  1,250 mg Intravenous Q12H    Time spent on care of this patient: 35 mins   Calvert Cantor, MD  [facility] Office  [phone] Pager - Text Page per Loretha Stapler as per below:  On-Call/Text Page:      [email or web address]      password [email or web address]  If 7PM-7AM, please contact night-coverage [email or web address] Password [email or web address] 08/23/2013, 2:16 PM   LOS: 7 days

## 2013-08-24 LAB — CBC
MCH: 30.2 pg (ref 26.0–34.0)
Platelets: 312 10*3/uL (ref 150–400)
RBC: 3.24 MIL/uL — ABNORMAL LOW (ref 4.22–5.81)
RDW: 16.6 % — ABNORMAL HIGH (ref 11.5–15.5)
WBC: 18.4 10*3/uL — ABNORMAL HIGH (ref 4.0–10.5)

## 2013-08-24 LAB — GENTAMICIN LEVEL, TROUGH: Gentamicin Trough: 0.9 ug/mL (ref 0.5–2.0)

## 2013-08-24 MED ORDER — SODIUM CHLORIDE 0.9 % IJ SOLN
10.0000 mL | INTRAMUSCULAR | Status: DC | PRN
  Administered 2013-08-24 – 2013-08-25 (×3): 10 mL

## 2013-08-24 MED ORDER — SODIUM CHLORIDE 0.9 % IJ SOLN
10.0000 mL | Freq: Two times a day (BID) | INTRAMUSCULAR | Status: DC
  Administered 2013-08-24: 10 mL

## 2013-08-24 MED ORDER — WHITE PETROLATUM GEL
Status: AC
  Filled 2013-08-24: qty 5

## 2013-08-24 NOTE — Progress Notes (Signed)
Pt. Was placed on CPAP auto titrate (Min: 5, Max: 15) via FFM (what pt. Wears at home) with 2 L O2 bled in. Pt. Is tolerating CPAP well at this time.

## 2013-08-24 NOTE — Progress Notes (Signed)
[facility] Progress Note [place] TEAM 1 - Stepdown/ICU TEAM   LAJUAN [name] ZOX:[id number] DOB: Feb 28, 1935 DOA: 09/13/2013 PCP: Ignatius Specking., MD  Admit HPI / Brief Narrative: 78 yo with CAD, DM-2, AF on xarelto with hemorrhagic shock after emergent Left iliofemoropopliteal thromboembolectomy requiring Patch angioplasty of left iliofemoral arterial segment for acute left leg ischemia . Op EBL ~1500cc, transfused 4 u PRBC and given albumin x 2 Intraoperatively. Started on pressors in OR . PCCM asked to consult for hypotension [name] [name] 11/15   SIGNIFICANT EVENTS / STUDIES:  11/14 CT angio bifem >embolism left common femoral , left popliteal arteries w/ extension to left peroneal artery as well as right deep femoral artery and post tibial arteries.  11/15 OR for emergent left iliofemoropopliteal thromboembolectomy /patch angioplasty  11/15 NSTEMI post op  11/15 R. IJ >> 11/16 TTE> aortic veg on bioprosthetic valve, mild LVH, normal systolic function; LA severly dilated, RA mild dilated, RVSP  11/21- Palliative care meeting- decided upon comfort care  Assessment/Plan:  Aortic valve bioprosthesis (2009) with acute vegetation/ Sepsis Care as per ID, Cardiology, and TCTS - results of TEE noted - not a surgical candidate however, -Most antibiotics stopped due to contribution to vomiting- will d/c Cefepime today as it is not adding to his care - appears to be declining quickly now- discussed with son that if he will be transferred to Hospice when bed available unless death appears imminent   PVD w/ critical left limb ischemia s/p embolectomy/angioplasty presumably related to Aortic valve veg - L 5th toe gangrene  Vasc Surgery is holding off on surgery at this point.   Hx of OSA  QHS CPAP per home regimen   Hypotension / Hypovolemic Shock BP soft during/after TEE - resume gentle hydration - BP improved  CAD s/p CABG 2000 - NSTEMI vs demand w/ postive enzymes  As per  Cardiology  Atrial fib on Xarelto prior to admission   Renal Insuffiency  Resolved - keep hydrated   Hypokalemia/ Hypomagnesemia Replace and follow - re-check Mg in AM  Protein calorie malnutrition  Nausea/ Vomiting - symptomatic care   Anemia -post op blood loss  S/p 4 u PRBC and 2 albumin during surgery 11/15   DM Reasonably well controlled at this time - follow trend   Dementia / Delirium  Code Status: DNR Family Communication: spoke with son in detail today  Disposition Plan: transfer to med/surg  Consultants: ID Cardiology PCCM >> [facility] Vasc Surgery    Antibiotics: 11/15 Zinacef post op  11/17 vanc >>  11/17 zosyn >>11/17  11/17 cefepime >>  11/17 rifampin >>  DVT prophylaxis: Full dose lovenox  HPI/Subjective: Pt is resting- son at bedside- level of alertness is decreased- asking to be turned due to back pain.   Objective: Blood pressure 112/78, pulse 114, temperature 98.6 F (37 C), temperature source Oral, resp. rate 23, height 5\' 11"  (1.803 m), weight 105.9 kg (233 lb 7.5 oz), SpO2 100.00%.  Intake/Output Summary (Last 24 hours) at 08/24/13 1705 Last data filed at 08/24/13 1047  Gross per 24 hour  Intake 546.67 ml  Output    725 ml  Net -178.33 ml   Exam: General: sleepy, rapid respirations, pale, cold and clammy.   Lungs: Clear to auscultation bilaterally without wheezes or crackles Cardiovascular: Regular rate without gallop or rub  Abdomen: Nontender, nondistended, soft, bowel sounds positive, no rebound, no ascites, no appreciable mass Extremities: No significant cyanosis, clubbing- edema of left foot and ankle L  great toe which is necrotic/black  Data Reviewed: Basic Metabolic Panel:  Recent Labs Lab 08/18/13 0300 08/19/13 0423 08/20/13 0405 09-10-13 0500 08/22/13 0407 08/23/13 0435  NA 133* 136 134* 132* 133*  --   K 4.1 3.5 3.3* 3.3* 3.7  --   CL 101 106 105 105 104  --   CO2 20 19 20 20 19   --   GLUCOSE 109* 98 93 108*  114*  --   BUN 16 10 8 6  5*  --   CREATININE 0.96 0.85 0.67 0.62 0.69  --   CALCIUM 8.1* 7.7* 7.4* 7.2* 7.3*  --   MG  --   --   --   --  1.3* 1.7   Liver Function Tests:  Recent Labs Lab 08/31/2013 2216 08/18/13 0300 08/19/13 0423  AST 10 11 23   ALT 5 <5 <5  ALKPHOS 43 48 50  BILITOT 0.9 0.9 0.7  PROT 5.0* 5.5* 5.1*  ALBUMIN 2.2* 2.4* 2.0*   CBC:  Recent Labs Lab 08/28/2013 2050  08/20/13 0405 10-Sep-2013 0500 08/22/13 0407 08/23/13 0435 08/24/13 0650  WBC 18.6*  < > 10.4 11.5* 14.9* 15.3* 18.4*  NEUTROABS 16.2*  --   --   --   --   --   --   HGB 9.9*  < > 8.3* 8.7* 9.3* 8.7* 9.8*  HCT 28.1*  < > 24.0* 25.2* 27.0* 25.9* 28.8*  MCV 85.2  < > 86.0 86.6 86.3 88.1 88.9  PLT 174  < > 206 217 258 296 312  < > = values in this interval not displayed. Cardiac Enzymes:  Recent Labs Lab 08/18/2013 2217 08/18/13 0300  CKTOTAL 19 22  CKMB 3.1 3.5  TROPONINI 0.38* 0.49*   BNP (last 3 results) No results found for this basename: PROBNP,  in the last 8760 hours CBG:  Recent Labs Lab 08/22/13 2123 08/23/13 0742 08/23/13 1205 08/23/13 1537 08/23/13 2132  GLUCAP 100* 107* 129* 111* 97    Recent Results (from the past 240 hour(s))  CULTURE, BLOOD (ROUTINE X 2)     Status: None   Collection Time    08/18/13  8:00 PM      Result Value Range Status   Specimen Description BLOOD RIGHT ARM   Final   Special Requests BOTTLES DRAWN AEROBIC AND ANAEROBIC 5CC EA   Final   Culture  Setup Time     Final   Value: 08/19/2013 04:33     Performed at [facility]   Culture     Final   Value:        BLOOD CULTURE RECEIVED NO GROWTH TO DATE CULTURE WILL BE HELD FOR 5 DAYS BEFORE ISSUING A FINAL NEGATIVE REPORT     Performed at [facility]   Report Status PENDING   Incomplete  CULTURE, BLOOD (ROUTINE X 2)     Status: None   Collection Time    08/18/13  8:10 PM      Result Value Range Status   Specimen Description BLOOD RIGHT HAND   Final   Special Requests BOTTLES  DRAWN AEROBIC AND ANAEROBIC 5CC EA   Final   Culture  Setup Time     Final   Value: 08/19/2013 04:32     Performed at [facility]   Culture     Final   Value:        BLOOD CULTURE RECEIVED NO GROWTH TO DATE CULTURE WILL BE HELD FOR 5 DAYS BEFORE ISSUING  A FINAL NEGATIVE REPORT     Performed at [facility]   Report Status PENDING   Incomplete     Studies:  Recent x-ray studies have been reviewed in detail by the Attending Physician  Scheduled Meds:  Scheduled Meds: . atorvastatin  10 mg Oral q1800  . ceFEPime (MAXIPIME) IV  1 g Intravenous Q8H  . enoxaparin (LOVENOX) injection  95 mg Subcutaneous Q12H  . levalbuterol  0.63 mg Nebulization Once  . ondansetron  4 mg Intravenous Q8H  . potassium chloride  40 mEq Oral BID  . sodium chloride  10-40 mL Intracatheter Q12H  . sodium chloride  3 mL Intravenous Q12H  . vancomycin  1,250 mg Intravenous Q12H  . white petrolatum        Time spent on care of this patient: 35 mins   Calvert Cantor, MD  [facility] Office  [phone] Pager - Text Page per Loretha Stapler as per below:  On-Call/Text Page:      [email or web address]      password [email or web address]  If 7PM-7AM, please contact night-coverage [email or web address] Password [email or web address] 08/24/2013, 5:05 PM   LOS: 8 days

## 2013-08-24 NOTE — Progress Notes (Signed)
Palliative Care Team at [facility] Progress Note   SUBJECTIVE: Less responsive. Not taking PO. Family not at bedside per nursing his sons have arrived from out of town. He is requiring more frequent IV morphine for dyspnea.   OBJECTIVE: Vital Signs: BP 108/69  Pulse 124  Temp(Src) 97.3 F (36.3 C) (Oral)  Resp 20  Ht 5\' 11"  (1.803 m)  Wt 105.9 kg (233 lb 7.5 oz)  BMI 32.58 kg/m2  SpO2 95%   Intake and Output: 11/21 0701 - 11/22 0700 In: 1401.7 [P.O.:520; I.V.:881.7] Out: 1050 [Urine:1050]  Physical Exam: General: Vital signs reviewed and noted. Dusky appearance. Increased WOB.  Head: Normocephalic, atraumatic.  Lungs:  +wheezing and Rhonchi  Heart: Irregular and tachycardic. S1 and S2 normal without gallop,  or rubs. (+) murmur  Abdomen:  Hypoactive BS, mildly distended  Extremities: + extremity edema.+mottled    Allergies  Allergen Reactions  . Bee Venom Anaphylaxis    Medications: Scheduled Meds:  . atorvastatin  10 mg Oral q1800  . ceFEPime (MAXIPIME) IV  1 g Intravenous Q8H  . enoxaparin (LOVENOX) injection  95 mg Subcutaneous Q12H  . levalbuterol  0.63 mg Nebulization Once  . ondansetron  4 mg Intravenous Q8H  . potassium chloride  40 mEq Oral BID  . sodium chloride  10-40 mL Intracatheter Q12H  . sodium chloride  3 mL Intravenous Q12H  . vancomycin  1,250 mg Intravenous Q12H  . white petrolatum        Continuous Infusions: . sodium chloride 10 mL/hr (08/23/13 2240)    PRN Meds: acetaminophen, acetaminophen, alum & mag hydroxide-simeth, bisacodyl, levalbuterol, loperamide, LORazepam, morphine injection, oxyCODONE-acetaminophen, phenol, polyvinyl alcohol, promethazine, senna-docusate, sodium chloride  Stool Softner: yes  Palliative Performance Scale: 20 %   Pain: no non-verbal signs of pain  Labs: CBC    Component Value Date/Time   WBC 18.4* 08/24/2013 0650   RBC 3.24* 08/24/2013 0650   HGB 9.8* 08/24/2013 0650   HCT 28.8* 08/24/2013 0650   PLT 312 08/24/2013 0650   MCV 88.9 08/24/2013 0650   MCH 30.2 08/24/2013 0650   MCHC 34.0 08/24/2013 0650   RDW 16.6* 08/24/2013 0650   LYMPHSABS 1.6 08/11/2013 2050   MONOABS 0.8 08/12/2013 2050   EOSABS 0.0 09/01/2013 2050   BASOSABS 0.0 08/18/2013 2050    CMET     Component Value Date/Time   NA 133* 08/22/2013 0407   K 3.7 08/22/2013 0407   CL 104 08/22/2013 0407   CO2 19 08/22/2013 0407   GLUCOSE 114* 08/22/2013 0407   BUN 5* 08/22/2013 0407   CREATININE 0.69 08/22/2013 0407   CREATININE 0.69 05/24/2013 1035   CALCIUM 7.3* 08/22/2013 0407   PROT 5.1* 08/19/2013 0423   ALBUMIN 2.0* 08/19/2013 0423   AST 23 08/19/2013 0423   ALT <5 08/19/2013 0423   ALKPHOS 50 08/19/2013 0423   BILITOT 0.7 08/19/2013 0423   GFRNONAA 90* 08/22/2013 0407   GFRAA >90 08/22/2013 0407     Imaging: Reviewed.  ASSESSMENT/ PLAN: [name] [name] is actively dying of sepsis related to aortic valve abscess/vegetation. Anticipate eventual rupture of valve and sudden cardiac death. Goals are full comfort.   Continue PRN morphine and ativan for comfort  Nausea is improved, maintain scheduled Zofran  Appropriate for Hospice Facility, family requesting [name] [name] are from [place], [place]  Our team will follow closely and support patient and family.   15 minutes. Greater than 50%  of this time was spent counseling and coordinating care related to  the above assessment and plan.   Edsel Petrin, DO  08/24/2013, 10:51 PM  Please contact Palliative Medicine Team phone at [phone] for questions and concerns.

## 2013-08-24 NOTE — Progress Notes (Signed)
Clinical Social Work Department BRIEF PSYCHOSOCIAL ASSESSMENT 08/24/2013  Patient:  MAJOR, SANTERRE     Account Number:  [id number]     Admit date:  08/19/2013  Clinical Social Worker:  Hendricks Milo  Date/Time:  08/24/2013 04:52 PM  Referred by:  Physician  Date Referred:  08/24/2013 Referred for  Residential hospice placement   Other Referral:   Interview type:  Family Other interview type:    PSYCHOSOCIAL DATA Living Status:  WIFE Admitted from facility:   Level of care:   Primary support name:  Jacky Dross [phone] Primary support relationship to patient:  SPOUSE Degree of support available:   Very supportive, is the primary caregiver of the patient.    CURRENT CONCERNS  Other Concerns:    SOCIAL WORK ASSESSMENT / PLAN Clinical Social Worker (CSW) met with patient's wife to discuss residential hospice placement. Wife agreeable to send referral to [facility] in [place], [place].   Assessment/plan status:  Psychosocial Support/Ongoing Assessment of Needs Other assessment/ plan:   Information/referral to community resources:   CSW gave wife list of residential hospice facilities.    PATIENT'S/FAMILY'S RESPONSE TO PLAN OF CARE: Wife thanked CSW for helping during this difficult time.

## 2013-08-25 LAB — CULTURE, BLOOD (ROUTINE X 2)

## 2013-09-02 NOTE — Progress Notes (Signed)
Family wanted to see if patient got more comfortable with pain medication before trying to place on CPAP.  RT let RN know to call RT if patients pain improved  to the point where he could wear it

## 2013-09-02 NOTE — Progress Notes (Signed)
[facility] Progress Note [place] TEAM 1 - Stepdown/ICU TEAM   [name] [name] ZOX:[id number] DOB: 12-08-1934 DOA: 2013/08/18 PCP: Ignatius Specking., MD  Admit HPI / Brief Narrative: 78 yo with CAD, DM-2, AF on xarelto with hemorrhagic shock after emergent Left iliofemoropopliteal thromboembolectomy requiring Patch angioplasty of left iliofemoral arterial segment for acute left leg ischemia . Op EBL ~1500cc, transfused 4 u PRBC and given albumin x 2 Intraoperatively. Started on pressors in OR . PCCM asked to consult for hypotension [name] [name] 11/15   SIGNIFICANT EVENTS / STUDIES:  11/14 CT angio bifem >embolism left common femoral , left popliteal arteries w/ extension to left peroneal artery as well as right deep femoral artery and post tibial arteries.  11/15 OR for emergent left iliofemoropopliteal thromboembolectomy /patch angioplasty  11/15 NSTEMI post op  11/15 R. IJ >> 11/16 TTE> aortic veg on bioprosthetic valve, mild LVH, normal systolic function; LA severly dilated, RA mild dilated, RVSP  11/21- Palliative care meeting- decided upon comfort care  Assessment/Plan:  Aortic valve bioprosthesis (2009) with acute vegetation/ Sepsis Care as per ID, Cardiology, and TCTS - results of TEE noted - not a surgical candidate however, - appears to be declining quickly now- discussed with son and his wife that he will be transferred to Hospice when bed available unless death appears imminent   PVD w/ critical left limb ischemia s/p embolectomy/angioplasty presumably related to Aortic valve veg - L 5th toe gangrene  Vasc Surgery is holding off on surgery at this point.   Hx of OSA  QHS CPAP per home regimen   Hypotension / Hypovolemic Shock BP soft during/after TEE - resume gentle hydration - BP improved  CAD s/p CABG 2000 - NSTEMI vs demand w/ postive enzymes  As per Cardiology  Atrial fib on Xarelto prior to admission   Renal Insuffiency  Resolved - keep hydrated    Hypokalemia/ Hypomagnesemia Replace and follow - re-check Mg in AM  Protein calorie malnutrition  Nausea/ Vomiting - symptomatic care   Anemia -post op blood loss  S/p 4 u PRBC and 2 albumin during surgery 11/15   DM Reasonably well controlled at this time - follow trend   Dementia / Delirium  Code Status: DNR Family Communication: spoke with his wife in detail today  Disposition Plan: hospice facility when bed available   Consultants: ID Cardiology PCCM >> [facility] Vasc Surgery    Antibiotics: 11/15 Zinacef post op  11/17 vanc >>  11/17 zosyn >>11/17  11/17 cefepime >>  11/17 rifampin >>  DVT prophylaxis: Full dose lovenox  HPI/Subjective: Pt is resting- very little interaction now  Objective: Blood pressure 96/72, pulse 111, temperature 97.9 F (36.6 C), temperature source Oral, resp. rate 20, height 5\' 11"  (1.803 m), weight 105.9 kg (233 lb 7.5 oz), SpO2 92.00%.  Intake/Output Summary (Last 24 hours) at 08/09/2013 1758 Last data filed at 08/08/2013 1500  Gross per 24 hour  Intake   3280 ml  Output    100 ml  Net   3180 ml   Exam: General: sleeping- appears comfortable - pale  Lungs: Clear to auscultation bilaterally without wheezes or crackles Cardiovascular: Regular rate without gallop or rub  Abdomen: Nontender, nondistended, soft, bowel sounds positive, no rebound, no ascites, no appreciable mass Extremities: No significant cyanosis, clubbing- edema of left foot and ankle L great toe which is necrotic/black  Data Reviewed: Basic Metabolic Panel:  Recent Labs Lab 08/19/13 0423 08/20/13 0405 08/13/2013 0500 08/22/13 0407 08/23/13 0435  NA 136 134* 132* 133*  --   K 3.5 3.3* 3.3* 3.7  --   CL 106 105 105 104  --   CO2 19 20 20 19   --   GLUCOSE 98 93 108* 114*  --   BUN 10 8 6  5*  --   CREATININE 0.85 0.67 0.62 0.69  --   CALCIUM 7.7* 7.4* 7.2* 7.3*  --   MG  --   --   --  1.3* 1.7   Liver Function Tests:  Recent Labs Lab 08/19/13 0423   AST 23  ALT <5  ALKPHOS 50  BILITOT 0.7  PROT 5.1*  ALBUMIN 2.0*   CBC:  Recent Labs Lab 08/20/13 0405 08/04/2013 0500 08/22/13 0407 08/23/13 0435 08/24/13 0650  WBC 10.4 11.5* 14.9* 15.3* 18.4*  HGB 8.3* 8.7* 9.3* 8.7* 9.8*  HCT 24.0* 25.2* 27.0* 25.9* 28.8*  MCV 86.0 86.6 86.3 88.1 88.9  PLT 206 217 258 296 312   Cardiac Enzymes: No results found for this basename: CKTOTAL, CKMB, CKMBINDEX, TROPONINI,  in the last 168 hours BNP (last 3 results) No results found for this basename: PROBNP,  in the last 8760 hours CBG:  Recent Labs Lab 08/22/13 2123 08/23/13 0742 08/23/13 1205 08/23/13 1537 08/23/13 2132  GLUCAP 100* 107* 129* 111* 97    Recent Results (from the past 240 hour(s))  CULTURE, BLOOD (ROUTINE X 2)     Status: None   Collection Time    08/18/13  8:00 PM      Result Value Range Status   Specimen Description BLOOD RIGHT ARM   Final   Special Requests BOTTLES DRAWN AEROBIC AND ANAEROBIC 5CC EA   Final   Culture  Setup Time     Final   Value: 08/19/2013 04:33     Performed at [facility]   Culture     Final   Value: NO GROWTH 5 DAYS     Performed at [facility]   Report Status 08/10/2013 FINAL   Final  CULTURE, BLOOD (ROUTINE X 2)     Status: None   Collection Time    08/18/13  8:10 PM      Result Value Range Status   Specimen Description BLOOD RIGHT HAND   Final   Special Requests BOTTLES DRAWN AEROBIC AND ANAEROBIC 5CC EA   Final   Culture  Setup Time     Final   Value: 08/19/2013 04:32     Performed at [facility]   Culture     Final   Value: NO GROWTH 5 DAYS     Performed at [facility]   Report Status 08/16/2013 FINAL   Final     Studies:  Recent x-ray studies have been reviewed in detail by the Attending Physician  Scheduled Meds:  Scheduled Meds: . atorvastatin  10 mg Oral q1800  . ceFEPime (MAXIPIME) IV  1 g Intravenous Q8H  . enoxaparin (LOVENOX) injection  95 mg Subcutaneous Q12H  .  levalbuterol  0.63 mg Nebulization Once  . ondansetron  4 mg Intravenous Q8H  . potassium chloride  40 mEq Oral BID  . sodium chloride  10-40 mL Intracatheter Q12H  . sodium chloride  3 mL Intravenous Q12H  . vancomycin  1,250 mg Intravenous Q12H    Time spent on care of this patient: 35 mins   Calvert Cantor, MD  [facility] Office  [phone] Pager - Text Page per Loretha Stapler as per below:  On-Call/Text Page:  [email or web address]      password [email or web address]  If 7PM-7AM, please contact night-coverage [email or web address] Password [email or web address] 09-19-13, 5:58 PM   LOS: 9 days

## 2013-09-02 NOTE — Progress Notes (Signed)
2030 pt expired/ no respirations/no blood pressure/ no pulse/ Pupils dilated/Pronounced by Stefanie Libel RN and Gean Maidens RN/ Wife and sons at beside/ On call physician notified R Reidler / [place] [facility] notified spoke with [facility] Moore/ referral number [phone] Savas Elvin RN

## 2013-09-02 NOTE — Progress Notes (Signed)
ANTIBIOTIC / ANTICOAGULATION CONSULT NOTE - FOLLOW UP  Pharmacy Consult:  Vancomycin + Lovenox Indication:  Aortic valve endocarditis + Critical left limb ischemia  Allergies  Allergen Reactions  . Bee Venom Anaphylaxis    Patient Measurements: Height: 5\' 11"  (180.3 cm) Weight: 233 lb 7.5 oz (105.9 kg) IBW/kg (Calculated) : 75.3  Vital Signs: None reported  Intake/Output from previous day: 11/22 0701 - 11/23 0700 In: 10 [I.V.:10] Out: 200 [Urine:200]  Labs:  Recent Labs  08/23/13 0435 08/24/13 0650  WBC 15.3* 18.4*  HGB 8.7* 9.8*  PLT 296 312   Estimated Creatinine Clearance: 97.2 ml/min (by C-G formula based on Cr of 0.69).  Recent Labs  08/24/13 1045  GENTTROUGH 0.9     Microbiology: Recent Results (from the past 720 hour(s))  CULTURE, BLOOD (ROUTINE X 2)     Status: None   Collection Time    08/18/13  8:00 PM      Result Value Range Status   Specimen Description BLOOD RIGHT ARM   Final   Special Requests BOTTLES DRAWN AEROBIC AND ANAEROBIC 5CC EA   Final   Culture  Setup Time     Final   Value: 08/19/2013 04:33     Performed at [facility]   Culture     Final   Value:        BLOOD CULTURE RECEIVED NO GROWTH TO DATE CULTURE WILL BE HELD FOR 5 DAYS BEFORE ISSUING A FINAL NEGATIVE REPORT     Performed at [facility]   Report Status PENDING   Incomplete  CULTURE, BLOOD (ROUTINE X 2)     Status: None   Collection Time    08/18/13  8:10 PM      Result Value Range Status   Specimen Description BLOOD RIGHT HAND   Final   Special Requests BOTTLES DRAWN AEROBIC AND ANAEROBIC 5CC EA   Final   Culture  Setup Time     Final   Value: 08/19/2013 04:32     Performed at [facility]   Culture     Final   Value:        BLOOD CULTURE RECEIVED NO GROWTH TO DATE CULTURE WILL BE HELD FOR 5 DAYS BEFORE ISSUING A FINAL NEGATIVE REPORT     Performed at [facility]   Report Status PENDING   Incomplete      Assessment: 36 YOM with  aortic valve endocarditis with perivalvular abscess to continue on vancomycin and cefepime.  Previous vancomycin trough was therapeutic and patient's renal function has been stable (last BMET 08/22/13).  Noted plan for Hospice.  Vanc 11/16 >> Zosyn 11/16 >> 11/17 Cefepime 11/17 >> Gent 11/17 >> 11/22 CTX >> 11/17 Rifampin IV 11/17 >> 11/21  11/16 Blood - NGTD  VT 11/18 = 19 (1250mg  q12h) GT 11/18 = 2.0, goal <1, decr GENT to 60 mg IV q12h GT 11/22 = 0.9 (obtained after drug d/c'ed)   Goal of Therapy:  Vancomycin trough level 15-20 mcg/ml Anti-Xa level 0.6-1.2 units/ml 4hrs after LMWH dose given   Plan:  - Vanc 1250mg  IV Q12H - Cefepime 1gm IV Q8H - Lovenox 95mg  SQ Q12H - Pharmacy will follow peripherally until antibiotics / anticoagulation are d/c'ed    Yaretsi Humphres D. Laney Potash, PharmD, BCPS Pager:  [phone] 09/01/2013, 8:42 AM

## 2013-09-02 NOTE — Progress Notes (Signed)
Significant Event Called by nursing. Patient's family with patient when patient passed away. Approximate time of death March 03, 2129. Death certificate completed.  Ahmarion Saraceno M, PA-C

## 2013-09-02 DEATH — deceased

## 2013-09-03 SURGERY — ECHOCARDIOGRAM, TRANSESOPHAGEAL
Anesthesia: Moderate Sedation

## 2013-09-06 NOTE — Discharge Summary (Addendum)
Death Summary  [name] [name]:[id number] DOB: 1935-05-03 DOA: 08-19-2013  PCP: Ignatius Specking., MD  Admit date: 08/19/2013 Date of Death: Aug 28, 2013  Final Diagnoses:  Principal Problem:   Critical lower limb ischemia Active Problems:   H/O aortic valve replacement with porcine valve; 23 mm Edwards Pericardial Magna Ease.-2000   OSA on CPAP   DVT (deep venous thrombosis)   Paroxysmal atrial flutter   Hemorrhagic shock   Acute blood loss anemia   CAD in native artery - s/p CABG in 2000   Prosthetic aortic valve vegetation   Type 2 diabetes mellitus with complications   Chronic anticoagulation   HTN (hypertension)   Dyslipidemia   Infective endocarditis    History of present illness:  78 yo with CAD, DM-2, AF on xarelto with hemorrhagic shock after emergent Left iliofemoropopliteal thromboembolectomy requiring Patch angioplasty of left iliofemoral arterial segment for acute left leg ischemia . He was admitted by the Vascular service. Op EBL ~1500cc, transfused 4 u PRBC and given albumin x 2 Intraoperatively. Started on pressors in OR . PCCM asked to consult for hypotension /shock 11/15 and patient was taken to the ICU. He had an NSTEMI during the episode of shock.    Hospital Course:  SIGNIFICANT EVENTS / STUDIES:  08/20/2023 CT angio bifem >embolism left common femoral , left popliteal arteries w/ extension to left peroneal artery as well as right deep femoral artery and post tibial arteries.  11/15 OR for emergent left iliofemoropopliteal thromboembolectomy /patch angioplasty  11/15 NSTEMI post op  11/15 R. IJ >>  11/16 TTE> aortic veg on bioprosthetic valve, mild LVH, normal systolic function; LA severly dilated, RA mild dilated, RVSP  11/21- Palliative care meeting- decided upon comfort care   Assessment/Plan:  Embolism to Left leg  - s/p surgery on 11/15  Critical left limb ischemia s/p left iliofemoropopliteal embolectomy, endarterectomy and angioplasty   Gangrene of L 5th toe  Hemorrhagic shock as a result of embolectomy procedure -S/p 4 u PRBC and 2 albumin during surgery 11/15   Atrial fib/ Flutter  -on Xarelto prior to admission   H/o DVT - on Xarelto prior to admission   Aortic valve bioprosthesis (2009) with acute vegetation/ Sepsis  - a TTE performed on 11/16 revealed a large, near circumferential vegetation/ abscess on the bioprosthetic aortic valve - TEE on 11/19 confirmed above and surgical removal of the valve recommended - unfortunately due to extent of endocarditis it was clear that the patient would have a poor prognosis. - he was evaluated by CT surgery and felt to be a poor candidate for valve surgery. This was discussed in detail with the patient and his wife. They both verbalized a through understanding of the inevitable.  - a palliative care consult was requested and eventually the patient's antibiotics were discontinued and comfort care was initiated.  - his children arrived and were able to speak with him prior this his passing - Shortly after stopping antibiotics, he began to decline quickly with increased dyspnea and lethargy.  -Respiratory distress was controlled with Morphine and restlessness with Ativan - he passed peacefully on 08-29-2023 on 2030  Hx of OSA    Hypotension / Hypovolemic Shock   CAD s/p CABG 2000 - NSTEMI vs demand w/ postive enzymes   Renal Insuffiency   Hypokalemia/ Hypomagnesemia   Protein calorie malnutrition   Nausea/ Vomiting   Anemia -post op blood loss   DM   Delirium    Time: >45 min  Signed:  Jaise Moser  [facility] 08/15/2013, 10:27 AM

## 2014-06-23 IMAGING — CT CT ANGIO AOBIFEM WO/W CM
1 of 9 series · 2 of 16 positions shown, 3 images · IV contrast (APPLIED)
Comparison: Bilateral lower extremity venous Doppler ultrasound -
06/13/2013 ; CT abdomen pelvis - 03/18/2011

CLINICAL DATA: Avascular necrosis of the left foot, left great toe
is purple and red, history of diabetes and DVT

EXAM:
CT ANGIOGRAPHY OF ABDOMINAL AORTA WITH ILIOFEMORAL RUNOFF
TECHNIQUE: Multidetector CT imaging of the abdomen, pelvis and lower
extremities was performed using the standard protocol during bolus
administration of intravenous contrast. Multiplanar CT image
reconstructions including MIPs were obtained to evaluate the
vascular anatomy.
CONTRAST:  100mL OMNIPAQUE IOHEXOL 350 MG/ML SOLN

[Series 5: angiorunoff 3.0 b30f · axial · 0.93mm/px · z∈[-1087,-586]mm · 2 of 503 slices shown, 3 images]
[im 168/503  soft-tissue]
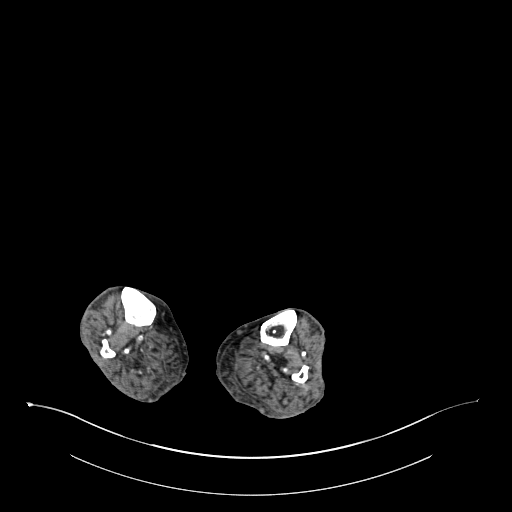
[im 168/503  bone]
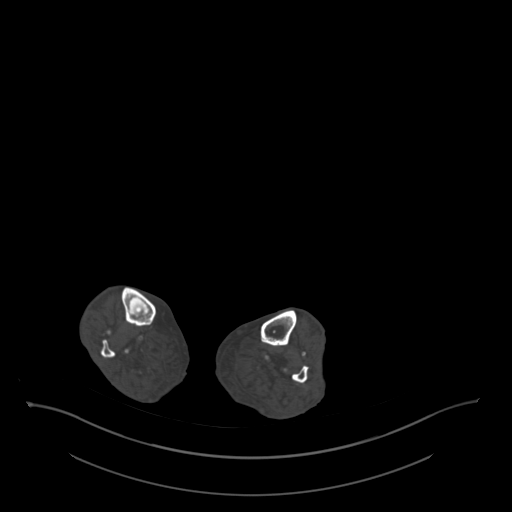
[im 335/503  soft-tissue]
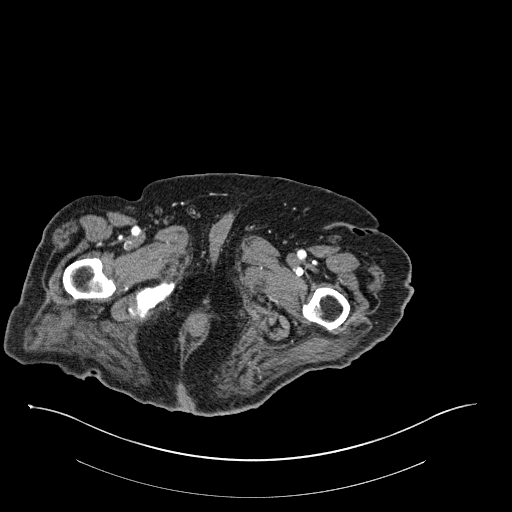

[2 of 16 positions shown; findings below may reference images not displayed]

FINDINGS: Vascular Findings:

Abdominal aorta: Rather extensive primarily calcified
atherosclerotic plaque within the abdominal aorta. There is mild
ectasia of the infrarenal abdominal aorta measuring approximately
2.6 cm (axial image 76, series 5, sagittal image 110, series 9). No
abdominal aortic dissection or periaortic stranding.

Celiac artery: The origin of the celiac artery is noted to be mildly
tortuous. There is a mixture of calcified and noncalcified plaque
involving the origin and proximal aspect of the celiac artery, not
definitely resulting in hemodynamically significant stenosis.
Conventional branching pattern.

SMA: There is a mixture of calcified and noncalcified plaque
involving the origin and main trunk of the SMA, not resulting in
hemodynamically significant stenosis. Incidental note is made of a
replaced right hepatic artery arising from the proximal SMA.

Right Renal artery: Solitary with early bifurcation. There is a
mixture of calcified and noncalcified atherosclerotic plaque
involving the origin and proximal aspect of the right renal artery,
possibly approaching 50% luminal narrowing.

Left Renal artery: Solitary; there is eccentric primarily calcified
atherosclerotic plaque involving the origin and proximal aspect of
the left renal artery, not resulting in hemodynamically significant
narrowing.

IMA: Widely patent.

------------------------------------------------------------------------------------------------------

Right lower extremity:

Pelvic vasculature: There is a mixture of calcified and noncalcified
plaque involving the right common iliac artery, not result in
hemodynamically significant narrowing. There is mixed calcified and
noncalcified atherosclerotic plaque within both the right external
and internal iliac arteries, not resulting in hemodynamically
significant narrowing.

Right common femoral artery: Minimal amount of eccentric
atherosclerotic plaque, not resulting in hemodynamically significant
narrowing.

Right deep femoral artery: There is a discrete filling defect within
the proximal aspect of the right deep femoral artery (images 167,
175 and 184, series 5) with short segment occlusion extending to the
level of the proximal/ mid thigh. There is reconstitution of the
distal aspect of the right deep femoral artery via deep thigh
collaterals.

Right superficial femoral artery: There is scattered minimal mixed
calcified and noncalcified atherosclerotic plaque within in the
proximal and distal aspects of the right superficial femoral artery,
not resulting in hemodynamically significant stenosis.

Right popliteal artery: The above knee popliteal artery is degraded
due to streak artifact from the right total knee replacement. The
below-knee popliteal artery appears patent throughout its imaged
course.

Right lower leg: There is a apparent nonocclusive embolism within in
the proximal aspect of the right posterior tibial artery
(representative images 328 and 331, series 5). Though the vessel
remains patent to the level of the forefoot. The right anterior
tibial and peroneal arteries are patent. A right-sided dorsalis
pedis artery is patent to the level of the mid foot.

------------------------------------------------------------------------------

Left lower extremity:

Pelvic vasculature: There is mixed calcified and noncalcified plaque
within the left common, external and internal iliac artery is all of
which are of normal caliber and free of hemodynamically significant
narrowing.

Left common femoral artery: Occluded throughout its course with
reconstitution via pelvic collateral flow.

Deep femoral artery: Widely patent throughout its course, though
there does appear to be a suspected minimal amount of distal
nonocclusive thrombus within the distal aspect of a anterior branch
vessel of the deep femoral artery (image 208, series 5).

Left superficial femoral artery: Embolism within the left common
femoral artery extends to involve the proximal aspect of the left
superficial femoral artery with early reconstitution. There is a
minimal amount of calcified and noncalcified atherosclerotic plaque
within the distal aspect of the left common femoral artery, not
resulting in hemodynamically significant stenosis.

Left popliteal artery: The above knee popliteal artery is degraded
due to streak artifact from the adjacent left total knee
replacement. There is nonocclusive embolism seen throughout the
image course of the left below-knee popliteal artery (image 303,
series 5).

Left lower leg: There is nonocclusive thrombus which extends to
involve the proximal aspect of the left peroneal artery (image 325).
There is short segment occlusion of the proximal aspect of the left
anterior tibial artery (image 346) with early reconstitution. The
left posterior tibial artery is patent to the level of the forefoot.
A patent left-sided dorsalis pedis artery is not identified.

Review of the MIP images confirms the above findings.

--------------------------------------------------------------------------------

Nonvascular Findings:

Evaluation of the abdominal organs is limited to the arterial phase
of enhancement.

Normal hepatic contour. No discrete hyperenhancing hepatic lesions.
The gallbladder is distended but otherwise normal. No discrete
radiopaque gallstones. No ascites.

There is symmetric enhancement of the bilateral kidneys. There is an
approximately 2.7 x 2.9 cm partially exophytic hypo attenuating (-8
Hounsfield unit) cyst arising from the mid aspect of the right
kidney (image 69, series 5). There is an additional approximately
1.9 cm hypoattenuating (7 Hounsfield unit) left-sided renal cyst
(image 79). Additional bilateral subcentimeter hypoattenuating renal
lesions are too small to accurately characterize. There is a grossly
unchanged approximate 1.4 x 1.5 cm nonobstructing stone within the
mid aspect of the left kidney (coronal image 116, series 7). No
urinary obstruction. Grossly symmetric likely age-related
perinephric stranding. No stones are seen along the expected course
of either ureter or within the urinary bladder. Normal appearance of
the urinary bladder given the. Distention. Normal appearance of the
bilateral adrenal glands and pancreas. Likely normal early arterial
phase appearance of the spleen with associated grossly unchanged
cleft within its mid aspect. Incidental note is made of a prominent
splenule within the splenic hilum. No perisplenic stranding.

Scattered colonic diverticulosis without evidence of diverticulitis.
Postsurgical change of the cecum. The appendix is not visualized,
presumably surgically absent. No evidence of enteric obstruction. No
pneumoperitoneum, pneumatosis or portal venous gas.

No retroperitoneal, mesenteric, pelvic or inguinal lymphadenopathy.

Limited visualization a lower thorax demonstrates minimal
subsegmental atelectasis within the imaged left lower lobe. No focal
airspace opacities. No pleural effusion.

Cardiomegaly. Post median sternotomy and aortic valve replacement.
No pericardial effusion.

No acute or aggressive osseous lesions. Stable sequela of L4-L5 ACDF
with associated right-sided paraspinal fusion at this level.
Bilateral facet degenerative change within the lower lumbar spine.
Post bilateral total knee replacements including right-sided redo
total knee replacement.
IMPRESSION: 1. Overall findings worrisome for embolic phenomenon involving left
common femoral and left below-knee popliteal arteries with extension
to involve primarily the left peroneal artery as well as the right
deep femoral artery and right posterior tibial arteries. As a
discrete source of this distal embolism is not depicted on this
examination (in particular, no evidence of significant abdominal
aortic aneurysm), further evaluation with cardiac echo (especially
in light of known aortic valve replacement) may be performed as
clinically indicated.
2. Rather extensive atherosclerotic plaque within a mildly ectatic
abdominal aorta which measures approximately 2.6 cm in maximum
diameter.
3. Right-sided nonobstructing nephrolithiasis.
Critical Value/emergent results were called by telephone at the time
of interpretation on 08/16/2013 at [DATE] to Dr.REZAII KAKAEI ,
who verbally acknowledged these results.

## 2014-07-07 NOTE — Telephone Encounter (Signed)
encounter opened in error
